# Patient Record
Sex: Male | Born: 1955
Health system: Southern US, Community
[De-identification: ages and names within clinical notes are randomized; demographics above are authoritative.]

## PROBLEM LIST (undated history)

## (undated) DIAGNOSIS — Z972 Presence of dental prosthetic device (complete) (partial): Secondary | ICD-10-CM

## (undated) DIAGNOSIS — I739 Peripheral vascular disease, unspecified: Secondary | ICD-10-CM

## (undated) DIAGNOSIS — N401 Enlarged prostate with lower urinary tract symptoms: Secondary | ICD-10-CM

## (undated) DIAGNOSIS — K621 Rectal polyp: Secondary | ICD-10-CM

## (undated) DIAGNOSIS — M5030 Other cervical disc degeneration, unspecified cervical region: Secondary | ICD-10-CM

## (undated) DIAGNOSIS — I452 Bifascicular block: Secondary | ICD-10-CM

## (undated) DIAGNOSIS — K635 Polyp of colon: Secondary | ICD-10-CM

## (undated) DIAGNOSIS — Z7982 Long term (current) use of aspirin: Secondary | ICD-10-CM

## (undated) DIAGNOSIS — R7301 Impaired fasting glucose: Secondary | ICD-10-CM

## (undated) DIAGNOSIS — Z87891 Personal history of nicotine dependence: Secondary | ICD-10-CM

## (undated) DIAGNOSIS — I5189 Other ill-defined heart diseases: Secondary | ICD-10-CM

## (undated) DIAGNOSIS — M5134 Other intervertebral disc degeneration, thoracic region: Secondary | ICD-10-CM

## (undated) DIAGNOSIS — R9431 Abnormal electrocardiogram [ECG] [EKG]: Secondary | ICD-10-CM

## (undated) DIAGNOSIS — K8020 Calculus of gallbladder without cholecystitis without obstruction: Secondary | ICD-10-CM

## (undated) DIAGNOSIS — Z8719 Personal history of other diseases of the digestive system: Secondary | ICD-10-CM

## (undated) DIAGNOSIS — R911 Solitary pulmonary nodule: Secondary | ICD-10-CM

## (undated) DIAGNOSIS — R972 Elevated prostate specific antigen [PSA]: Secondary | ICD-10-CM

## (undated) DIAGNOSIS — J439 Emphysema, unspecified: Secondary | ICD-10-CM

## (undated) DIAGNOSIS — I700 Atherosclerosis of aorta: Secondary | ICD-10-CM

## (undated) DIAGNOSIS — Z720 Tobacco use: Secondary | ICD-10-CM

## (undated) DIAGNOSIS — E785 Hyperlipidemia, unspecified: Secondary | ICD-10-CM

## (undated) DIAGNOSIS — I10 Essential (primary) hypertension: Secondary | ICD-10-CM

## (undated) DIAGNOSIS — K4020 Bilateral inguinal hernia, without obstruction or gangrene, not specified as recurrent: Secondary | ICD-10-CM

## (undated) DIAGNOSIS — K08109 Complete loss of teeth, unspecified cause, unspecified class: Secondary | ICD-10-CM

## (undated) DIAGNOSIS — K219 Gastro-esophageal reflux disease without esophagitis: Secondary | ICD-10-CM

## (undated) DIAGNOSIS — K5730 Diverticulosis of large intestine without perforation or abscess without bleeding: Secondary | ICD-10-CM

## (undated) DIAGNOSIS — E119 Type 2 diabetes mellitus without complications: Secondary | ICD-10-CM

## (undated) DIAGNOSIS — N200 Calculus of kidney: Secondary | ICD-10-CM

## (undated) DIAGNOSIS — I2510 Atherosclerotic heart disease of native coronary artery without angina pectoris: Secondary | ICD-10-CM

## (undated) HISTORY — DX: Emphysema, unspecified: J43.9

## (undated) HISTORY — DX: Personal history of other diseases of the digestive system: Z87.19

## (undated) HISTORY — DX: Atherosclerotic heart disease of native coronary artery without angina pectoris: I25.10

## (undated) HISTORY — PX: HERNIA REPAIR: SHX51

## (undated) HISTORY — DX: Essential (primary) hypertension: I10

## (undated) HISTORY — PX: CARDIAC CATHETERIZATION: SHX172

## (undated) HISTORY — PX: COLONOSCOPY: SHX174

## (undated) HISTORY — DX: Polyp of colon: K63.5

## (undated) HISTORY — DX: Impaired fasting glucose: R73.01

## (undated) HISTORY — DX: Hyperlipidemia, unspecified: E78.5

## (undated) HISTORY — DX: Diverticulosis of large intestine without perforation or abscess without bleeding: K57.30

## (undated) HISTORY — DX: Personal history of nicotine dependence: Z87.891

## (undated) HISTORY — DX: Gastro-esophageal reflux disease without esophagitis: K21.9

## (undated) HISTORY — PX: EYE SURGERY: SHX253

---

## 2001-06-29 ENCOUNTER — Observation Stay (HOSPITAL_COMMUNITY): Admission: RE | Admit: 2001-06-29 | Discharge: 2001-06-30 | Payer: Self-pay | Admitting: Neurosurgery

## 2001-06-29 HISTORY — PX: CERVICAL FUSION: SHX112

## 2006-01-21 HISTORY — PX: CORONARY ARTERY BYPASS GRAFT: SHX141

## 2006-11-13 ENCOUNTER — Ambulatory Visit: Payer: Self-pay | Admitting: Cardiovascular Disease

## 2006-11-13 DIAGNOSIS — I2510 Atherosclerotic heart disease of native coronary artery without angina pectoris: Secondary | ICD-10-CM

## 2006-11-13 HISTORY — PX: LEFT HEART CATH AND CORONARY ANGIOGRAPHY: CATH118249

## 2006-11-13 HISTORY — DX: Atherosclerotic heart disease of native coronary artery without angina pectoris: I25.10

## 2006-11-21 DIAGNOSIS — Z951 Presence of aortocoronary bypass graft: Secondary | ICD-10-CM

## 2006-11-21 HISTORY — DX: Presence of aortocoronary bypass graft: Z95.1

## 2006-11-21 HISTORY — PX: CORONARY ARTERY BYPASS GRAFT: SHX141

## 2009-01-21 DIAGNOSIS — Z8719 Personal history of other diseases of the digestive system: Secondary | ICD-10-CM

## 2009-01-21 DIAGNOSIS — K648 Other hemorrhoids: Secondary | ICD-10-CM

## 2009-01-21 DIAGNOSIS — K635 Polyp of colon: Secondary | ICD-10-CM

## 2009-01-21 DIAGNOSIS — K5730 Diverticulosis of large intestine without perforation or abscess without bleeding: Secondary | ICD-10-CM

## 2009-01-21 HISTORY — DX: Polyp of colon: K63.5

## 2009-01-21 HISTORY — DX: Other hemorrhoids: K64.8

## 2009-01-21 HISTORY — DX: Diverticulosis of large intestine without perforation or abscess without bleeding: K57.30

## 2009-01-21 HISTORY — DX: Personal history of other diseases of the digestive system: Z87.19

## 2009-06-22 ENCOUNTER — Ambulatory Visit: Payer: Self-pay | Admitting: Gastroenterology

## 2014-12-10 ENCOUNTER — Other Ambulatory Visit: Payer: Self-pay | Admitting: Family Medicine

## 2014-12-14 ENCOUNTER — Ambulatory Visit (INDEPENDENT_AMBULATORY_CARE_PROVIDER_SITE_OTHER): Payer: 59 | Admitting: Family Medicine

## 2014-12-14 ENCOUNTER — Encounter: Payer: Self-pay | Admitting: Family Medicine

## 2014-12-14 VITALS — BP 152/79 | HR 67 | Temp 98.8°F | Ht 72.2 in | Wt 199.0 lb

## 2014-12-14 DIAGNOSIS — Z0000 Encounter for general adult medical examination without abnormal findings: Secondary | ICD-10-CM

## 2014-12-14 DIAGNOSIS — I10 Essential (primary) hypertension: Secondary | ICD-10-CM

## 2014-12-14 DIAGNOSIS — Z23 Encounter for immunization: Secondary | ICD-10-CM | POA: Diagnosis not present

## 2014-12-14 DIAGNOSIS — I2510 Atherosclerotic heart disease of native coronary artery without angina pectoris: Secondary | ICD-10-CM | POA: Diagnosis not present

## 2014-12-14 DIAGNOSIS — Z113 Encounter for screening for infections with a predominantly sexual mode of transmission: Secondary | ICD-10-CM | POA: Diagnosis not present

## 2014-12-14 DIAGNOSIS — E785 Hyperlipidemia, unspecified: Secondary | ICD-10-CM | POA: Diagnosis not present

## 2014-12-14 DIAGNOSIS — E1169 Type 2 diabetes mellitus with other specified complication: Secondary | ICD-10-CM | POA: Insufficient documentation

## 2014-12-14 DIAGNOSIS — K219 Gastro-esophageal reflux disease without esophagitis: Secondary | ICD-10-CM | POA: Diagnosis not present

## 2014-12-14 DIAGNOSIS — Z1211 Encounter for screening for malignant neoplasm of colon: Secondary | ICD-10-CM

## 2014-12-14 LAB — MICROSCOPIC EXAMINATION: WBC UA: NONE SEEN /HPF (ref 0–?)

## 2014-12-14 LAB — URINALYSIS, ROUTINE W REFLEX MICROSCOPIC
BILIRUBIN UA: NEGATIVE
GLUCOSE, UA: NEGATIVE
Ketones, UA: NEGATIVE
LEUKOCYTES UA: NEGATIVE
Nitrite, UA: NEGATIVE
PROTEIN UA: NEGATIVE
Specific Gravity, UA: 1.015 (ref 1.005–1.030)
UUROB: 0.2 mg/dL (ref 0.2–1.0)
pH, UA: 6 (ref 5.0–7.5)

## 2014-12-14 MED ORDER — METOPROLOL SUCCINATE ER 100 MG PO TB24: 100.0000 mg | ORAL_TABLET | Freq: Every day | ORAL | Status: DC

## 2014-12-14 MED ORDER — ROSUVASTATIN CALCIUM 40 MG PO TABS: 40.0000 mg | ORAL_TABLET | Freq: Every day | ORAL | Status: DC

## 2014-12-14 MED ORDER — COLESEVELAM HCL 625 MG PO TABS: 1875.0000 mg | ORAL_TABLET | Freq: Two times a day (BID) | ORAL | Status: DC

## 2014-12-14 MED ORDER — AMLODIPINE BESYLATE 5 MG PO TABS: 5.0000 mg | ORAL_TABLET | Freq: Every day | ORAL | Status: DC

## 2014-12-14 MED ORDER — BENAZEPRIL HCL 40 MG PO TABS: 40.0000 mg | ORAL_TABLET | Freq: Every day | ORAL | Status: DC

## 2014-12-14 NOTE — Assessment & Plan Note (Signed)
The current medical regimen is effective;  continue present plan and medications.  

## 2014-12-14 NOTE — Progress Notes (Signed)
BP 152/79 mmHg  Pulse 67  Temp(Src) 98.8 F (37.1 C)  Ht 6' 0.2" (1.834 m)  Wt 199 lb (90.266 kg)  BMI 26.84 kg/m2  SpO2 99%   Subjective:    Patient ID: Maxwell Young, male    DOB: 06/23/1955, 59 y.o.   MRN: [id number]  HPI: Maxwell Young is a 59 y.o. male  Chief Complaint  Patient presents with  . Annual Exam   Patient doing well taking Benzapril metoprolol every day without problems or side effects blood pressure at home is been doing well Taking Crestor and WelChol for cholesterol which seems to be doing well no side effects taking aspirin and fish oil.  Relevant past medical, surgical, family and social history reviewed and updated as indicated. Interim medical history since our last visit reviewed. Allergies and medications reviewed and updated.  Review of Systems  Constitutional: Negative.   HENT: Negative.   Eyes: Negative.   Respiratory: Negative.   Cardiovascular: Negative.   Gastrointestinal: Negative.   Endocrine: Negative.   Genitourinary: Negative.   Musculoskeletal: Negative.   Skin: Negative.   Allergic/Immunologic: Negative.   Neurological: Negative.   Hematological: Negative.   Psychiatric/Behavioral: Negative.     Per HPI unless specifically indicated above     Objective:    BP 152/79 mmHg  Pulse 67  Temp(Src) 98.8 F (37.1 C)  Ht 6' 0.2" (1.834 m)  Wt 199 lb (90.266 kg)  BMI 26.84 kg/m2  SpO2 99%  Wt Readings from Last 3 Encounters:  12/14/14 199 lb (90.266 kg)  05/23/14 197 lb (89.359 kg)    Physical Exam  Constitutional: He is oriented to person, place, and time. He appears well-developed and well-nourished.  HENT:  Head: Normocephalic and atraumatic.  Right Ear: External ear normal.  Left Ear: External ear normal.  Eyes: Conjunctivae and EOM are normal. Pupils are equal, round, and reactive to light.  Neck: Normal range of motion. Neck supple.  Cardiovascular: Normal rate, regular rhythm, normal heart sounds and intact  distal pulses.   Pulmonary/Chest: Effort normal and breath sounds normal.  Abdominal: Soft. Bowel sounds are normal. There is no splenomegaly or hepatomegaly.  Genitourinary: Rectum normal, prostate normal and penis normal.  Musculoskeletal: Normal range of motion.  Neurological: He is alert and oriented to person, place, and time. He has normal reflexes.  Skin: No rash noted. No erythema.  Small sebaceous cyst under left eye evacuated  Psychiatric: He has a normal mood and affect. His behavior is normal. Judgment and thought content normal.    No results found for this or any previous visit.    Assessment & Plan:   Problem List Items Addressed This Visit      Cardiovascular and Mediastinum   Essential hypertension    Discussed blood pressure too high will start amlodipine continue other medications recheck 1 month      Relevant Medications   aspirin EC 325 MG tablet   benazepril (LOTENSIN) 40 MG tablet   metoprolol succinate (TOPROL-XL) 100 MG 24 hr tablet   rosuvastatin (CRESTOR) 40 MG tablet   colesevelam (WELCHOL) 625 MG tablet   amLODipine (NORVASC) 5 MG tablet   CAD (coronary artery disease)    NO SX      Relevant Medications   aspirin EC 325 MG tablet   benazepril (LOTENSIN) 40 MG tablet   metoprolol succinate (TOPROL-XL) 100 MG 24 hr tablet   rosuvastatin (CRESTOR) 40 MG tablet   colesevelam (WELCHOL) 625 MG tablet  amLODipine (NORVASC) 5 MG tablet     Digestive   GERD (gastroesophageal reflux disease)     Other   Hyperlipidemia    The current medical regimen is effective;  continue present plan and medications.       Relevant Medications   aspirin EC 325 MG tablet   benazepril (LOTENSIN) 40 MG tablet   metoprolol succinate (TOPROL-XL) 100 MG 24 hr tablet   rosuvastatin (CRESTOR) 40 MG tablet   colesevelam (WELCHOL) 625 MG tablet   amLODipine (NORVASC) 5 MG tablet    Other Visit Diagnoses    Immunization due    -  Primary    Relevant Orders    Flu  Vaccine QUAD 36+ mos PF IM (Fluarix & Fluzone Quad PF) (Completed)    Colon cancer screening        Relevant Orders    Ambulatory referral to General Surgery    Routine screening for STI (sexually transmitted infection)        Relevant Orders    HIV antibody    Hepatitis C Antibody    Routine general medical examination at a health care facility        Relevant Orders    CBC with Differential/Platelet    Comprehensive metabolic panel    Lipid Panel w/o Chol/HDL Ratio    PSA    TSH    Urinalysis, Routine w reflex microscopic (not at [facility])       Of course encouraged patient to quit smoking Follow up plan: Return in about 4 weeks (around 01/11/2015) for Blood pressure check.

## 2014-12-14 NOTE — Assessment & Plan Note (Signed)
Discussed blood pressure too high will start amlodipine continue other medications recheck 1 month

## 2014-12-14 NOTE — Assessment & Plan Note (Signed)
NO SX. °

## 2014-12-15 LAB — CBC WITH DIFFERENTIAL/PLATELET
BASOS ABS: 0 10*3/uL (ref 0.0–0.2)
Basos: 0 %
EOS (ABSOLUTE): 0.1 10*3/uL (ref 0.0–0.4)
Eos: 1 %
HEMOGLOBIN: 13.8 g/dL (ref 12.6–17.7)
Hematocrit: 41 % (ref 37.5–51.0)
IMMATURE GRANS (ABS): 0 10*3/uL (ref 0.0–0.1)
IMMATURE GRANULOCYTES: 1 %
Lymphocytes Absolute: 1.6 10*3/uL (ref 0.7–3.1)
Lymphs: 18 %
MCH: 31.7 pg (ref 26.6–33.0)
MCHC: 33.7 g/dL (ref 31.5–35.7)
MCV: 94 fL (ref 79–97)
MONOCYTES: 12 %
Monocytes Absolute: 1.1 10*3/uL — ABNORMAL HIGH (ref 0.1–0.9)
Neutrophils Absolute: 6 10*3/uL (ref 1.4–7.0)
Neutrophils: 68 %
Platelets: 150 10*3/uL (ref 150–379)
RBC: 4.36 x10E6/uL (ref 4.14–5.80)
RDW: 13.9 % (ref 12.3–15.4)
WBC: 8.8 10*3/uL (ref 3.4–10.8)

## 2014-12-15 LAB — COMPREHENSIVE METABOLIC PANEL
ALBUMIN: 4.1 g/dL (ref 3.5–5.5)
ALK PHOS: 76 IU/L (ref 39–117)
ALT: 27 IU/L (ref 0–44)
AST: 26 IU/L (ref 0–40)
Albumin/Globulin Ratio: 2 (ref 1.1–2.5)
BUN / CREAT RATIO: 14 (ref 9–20)
BUN: 14 mg/dL (ref 6–24)
Bilirubin Total: 0.4 mg/dL (ref 0.0–1.2)
CALCIUM: 8.7 mg/dL (ref 8.7–10.2)
CO2: 21 mmol/L (ref 18–29)
CREATININE: 1.01 mg/dL (ref 0.76–1.27)
Chloride: 103 mmol/L (ref 97–106)
GFR calc Af Amer: 95 mL/min/{1.73_m2} (ref >59)
GFR calc non Af Amer: 82 mL/min/{1.73_m2} (ref >59)
GLUCOSE: 137 mg/dL — AB (ref 65–99)
Globulin, Total: 2.1 g/dL (ref 1.5–4.5)
Potassium: 4.1 mmol/L (ref 3.5–5.2)
Sodium: 141 mmol/L (ref 136–144)
Total Protein: 6.2 g/dL (ref 6.0–8.5)

## 2014-12-15 LAB — LIPID PANEL W/O CHOL/HDL RATIO
CHOLESTEROL TOTAL: 112 mg/dL (ref 100–199)
HDL: 36 mg/dL — ABNORMAL LOW (ref >39)
LDL CALC: 46 mg/dL (ref 0–99)
TRIGLYCERIDES: 149 mg/dL (ref 0–149)
VLDL CHOLESTEROL CAL: 30 mg/dL (ref 5–40)

## 2014-12-15 LAB — PSA: Prostate Specific Ag, Serum: 2 ng/mL (ref 0.0–4.0)

## 2014-12-15 LAB — HIV ANTIBODY (ROUTINE TESTING W REFLEX): HIV SCREEN 4TH GENERATION: NONREACTIVE

## 2014-12-15 LAB — TSH: TSH: 1.35 u[IU]/mL (ref 0.450–4.500)

## 2014-12-15 LAB — HEPATITIS C ANTIBODY: Hep C Virus Ab: <0.1 s/co ratio (ref 0.0–0.9)

## 2014-12-19 ENCOUNTER — Encounter: Payer: Self-pay | Admitting: Family Medicine

## 2015-01-01 ENCOUNTER — Other Ambulatory Visit: Payer: Self-pay | Admitting: Family Medicine

## 2015-01-09 ENCOUNTER — Ambulatory Visit (INDEPENDENT_AMBULATORY_CARE_PROVIDER_SITE_OTHER): Payer: 59 | Admitting: Family Medicine

## 2015-01-09 ENCOUNTER — Encounter: Payer: Self-pay | Admitting: Family Medicine

## 2015-01-09 VITALS — BP 111/68 | HR 77 | Temp 97.8°F | Ht 72.7 in | Wt 200.2 lb

## 2015-01-09 DIAGNOSIS — I2510 Atherosclerotic heart disease of native coronary artery without angina pectoris: Secondary | ICD-10-CM | POA: Diagnosis not present

## 2015-01-09 DIAGNOSIS — E785 Hyperlipidemia, unspecified: Secondary | ICD-10-CM

## 2015-01-09 DIAGNOSIS — I10 Essential (primary) hypertension: Secondary | ICD-10-CM | POA: Diagnosis not present

## 2015-01-09 MED ORDER — AMLODIPINE BESYLATE 5 MG PO TABS: 5.0000 mg | ORAL_TABLET | Freq: Every day | ORAL | Status: DC

## 2015-01-09 NOTE — Assessment & Plan Note (Signed)
  Medication working well without side effects will continue

## 2015-01-09 NOTE — Assessment & Plan Note (Addendum)
The current medical regimen is effective;  continue present plan and medications. Discussed smoking cessation patient not interested encouraged anyway

## 2015-01-09 NOTE — Progress Notes (Signed)
BP 111/68 mmHg  Pulse 77  Temp(Src) 97.8 F (36.6 C)  Ht 6' 0.7" (1.847 m)  Wt 200 lb 3.2 oz (90.81 kg)  BMI 26.62 kg/m2  SpO2 98%   Subjective:    Patient ID: Maxwell Young, male    DOB: 06/15/1955, 59 y.o.   MRN: [id number]  HPI: Maxwell Young is a 59 y.o. male  Chief Complaint  Patient presents with  . Hypertension   patient doing well on new medication amlodipine takes without problems or side effects no complaints of edema Cholesterol medicine doing well no complaints no chest pain Discussed smoking cessation with patient patient still smoking heavily with no intention of quitting  Relevant past medical, surgical, family and social history reviewed and updated as indicated. Interim medical history since our last visit reviewed. Allergies and medications reviewed and updated.  Review of Systems  Constitutional: Negative.   Respiratory: Negative.   Cardiovascular: Negative.     Per HPI unless specifically indicated above     Objective:    BP 111/68 mmHg  Pulse 77  Temp(Src) 97.8 F (36.6 C)  Ht 6' 0.7" (1.847 m)  Wt 200 lb 3.2 oz (90.81 kg)  BMI 26.62 kg/m2  SpO2 98%  Wt Readings from Last 3 Encounters:  01/09/15 200 lb 3.2 oz (90.81 kg)  12/14/14 199 lb (90.266 kg)  05/23/14 197 lb (89.359 kg)    Physical Exam  Constitutional: He is oriented to person, place, and time. He appears well-developed and well-nourished. No distress.  HENT:  Head: Normocephalic and atraumatic.  Right Ear: Hearing normal.  Left Ear: Hearing normal.  Nose: Nose normal.  Eyes: Conjunctivae and lids are normal. Right eye exhibits no discharge. Left eye exhibits no discharge. No scleral icterus.  Cardiovascular: Normal rate and regular rhythm.   Pulmonary/Chest: Effort normal and breath sounds normal. No respiratory distress.  Musculoskeletal: Normal range of motion.  Neurological: He is alert and oriented to person, place, and time.  Skin: Skin is intact. No rash noted.   Psychiatric: He has a normal mood and affect. His speech is normal and behavior is normal. Judgment and thought content normal. Cognition and memory are normal.    Results for orders placed or performed in visit on 12/14/14  Microscopic Examination  Result Value Ref Range   WBC, UA None seen 0 -  5 /hpf   RBC, UA 0-2 0 -  2 /hpf   Epithelial Cells (non renal) 0-10 0 - 10 /hpf   Bacteria, UA Few None seen/Few  CBC with Differential/Platelet  Result Value Ref Range   WBC 8.8 3.4 - 10.8 x10E3/uL   RBC 4.36 4.14 - 5.80 x10E6/uL   Hemoglobin 13.8 12.6 - 17.7 g/dL   Hematocrit 41.0 37.5 - 51.0 %   MCV 94 79 - 97 fL   MCH 31.7 26.6 - 33.0 pg   MCHC 33.7 31.5 - 35.7 g/dL   RDW 13.9 12.3 - 15.4 %   Platelets 150 150 - 379 x10E3/uL   Neutrophils 68 %   Lymphs 18 %   Monocytes 12 %   Eos 1 %   Basos 0 %   Neutrophils Absolute 6.0 1.4 - 7.0 x10E3/uL   Lymphocytes Absolute 1.6 0.7 - 3.1 x10E3/uL   Monocytes Absolute 1.1 (H) 0.1 - 0.9 x10E3/uL   EOS (ABSOLUTE) 0.1 0.0 - 0.4 x10E3/uL   Basophils Absolute 0.0 0.0 - 0.2 x10E3/uL   Immature Granulocytes 1 %   Immature Grans (Abs) 0.0  0.0 - 0.1 x10E3/uL  Comprehensive metabolic panel  Result Value Ref Range   Glucose 137 (H) 65 - 99 mg/dL   BUN 14 6 - 24 mg/dL   Creatinine, Ser 1.01 0.76 - 1.27 mg/dL   GFR calc non Af Amer 82 >59 mL/min/1.73   GFR calc Af Amer 95 >59 mL/min/1.73   BUN/Creatinine Ratio 14 9 - 20   Sodium 141 136 - 144 mmol/L   Potassium 4.1 3.5 - 5.2 mmol/L   Chloride 103 97 - 106 mmol/L   CO2 21 18 - 29 mmol/L   Calcium 8.7 8.7 - 10.2 mg/dL   Total Protein 6.2 6.0 - 8.5 g/dL   Albumin 4.1 3.5 - 5.5 g/dL   Globulin, Total 2.1 1.5 - 4.5 g/dL   Albumin/Globulin Ratio 2.0 1.1 - 2.5   Bilirubin Total 0.4 0.0 - 1.2 mg/dL   Alkaline Phosphatase 76 39 - 117 IU/L   AST 26 0 - 40 IU/L   ALT 27 0 - 44 IU/L  Lipid Panel w/o Chol/HDL Ratio  Result Value Ref Range   Cholesterol, Total 112 100 - 199 mg/dL   Triglycerides 149  0 - 149 mg/dL   HDL 36 (L) >39 mg/dL   VLDL Cholesterol Cal 30 5 - 40 mg/dL   LDL Calculated 46 0 - 99 mg/dL  PSA  Result Value Ref Range   Prostate Specific Ag, Serum 2.0 0.0 - 4.0 ng/mL  TSH  Result Value Ref Range   TSH 1.350 0.450 - 4.500 uIU/mL  HIV antibody  Result Value Ref Range   HIV Screen 4th Generation wRfx Non Reactive Non Reactive  Urinalysis, Routine w reflex microscopic (not at [facility])  Result Value Ref Range   Specific Gravity, UA 1.015 1.005 - 1.030   pH, UA 6.0 5.0 - 7.5   Color, UA Yellow Yellow   Appearance Ur Clear Clear   Leukocytes, UA Negative Negative   Protein, UA Negative Negative/Trace   Glucose, UA Negative Negative   Ketones, UA Negative Negative   RBC, UA Trace (A) Negative   Bilirubin, UA Negative Negative   Urobilinogen, Ur 0.2 0.2 - 1.0 mg/dL   Nitrite, UA Negative Negative   Microscopic Examination See below:   Hepatitis C Antibody  Result Value Ref Range   Hep C Virus Ab <0.1 0.0 - 0.9 s/co ratio      Assessment & Plan:   Problem List Items Addressed This Visit      Cardiovascular and Mediastinum   Essential hypertension - Primary     Medication working well without side effects will continue      Relevant Medications   amLODipine (NORVASC) 5 MG tablet   CAD (coronary artery disease)    The current medical regimen is effective;  continue present plan and medications. Discussed smoking cessation patient not interested encouraged anyway      Relevant Medications   amLODipine (NORVASC) 5 MG tablet     Other   Hyperlipidemia    The current medical regimen is effective;  continue present plan and medications. Reviewed labs with patient and stable      Relevant Medications   amLODipine (NORVASC) 5 MG tablet       Follow up plan: Return for 5 mo med check lipids, alt, ast, BMP`.

## 2015-01-09 NOTE — Assessment & Plan Note (Signed)
The current medical regimen is effective;  continue present plan and medications. Reviewed labs with patient and stable

## 2015-03-13 ENCOUNTER — Telehealth: Payer: Self-pay

## 2015-03-13 DIAGNOSIS — E785 Hyperlipidemia, unspecified: Secondary | ICD-10-CM

## 2015-03-13 DIAGNOSIS — I10 Essential (primary) hypertension: Secondary | ICD-10-CM

## 2015-03-13 MED ORDER — AMLODIPINE BESYLATE 5 MG PO TABS: 5.0000 mg | ORAL_TABLET | Freq: Every day | ORAL | Status: DC

## 2015-03-13 MED ORDER — ROSUVASTATIN CALCIUM 40 MG PO TABS: 40.0000 mg | ORAL_TABLET | Freq: Every day | ORAL | Status: DC

## 2015-03-13 MED ORDER — METOPROLOL SUCCINATE ER 100 MG PO TB24: 100.0000 mg | ORAL_TABLET | Freq: Every day | ORAL | Status: DC

## 2015-03-13 MED ORDER — BENAZEPRIL HCL 40 MG PO TABS: 40.0000 mg | ORAL_TABLET | Freq: Every day | ORAL | Status: DC

## 2015-03-13 MED ORDER — COLESEVELAM HCL 625 MG PO TABS: 1875.0000 mg | ORAL_TABLET | Freq: Two times a day (BID) | ORAL | Status: DC

## 2015-03-13 NOTE — Telephone Encounter (Signed)
[facility]  Requesting 90 day Rx's  Amlodipine Benazepril Welchol Metoprolol Succ Rosuvastatin

## 2015-06-13 ENCOUNTER — Encounter: Payer: Self-pay | Admitting: Family Medicine

## 2015-06-13 ENCOUNTER — Ambulatory Visit (INDEPENDENT_AMBULATORY_CARE_PROVIDER_SITE_OTHER): Payer: 59 | Admitting: Family Medicine

## 2015-06-13 VITALS — BP 138/77 | HR 72 | Temp 98.0°F | Ht 72.2 in | Wt 199.0 lb

## 2015-06-13 DIAGNOSIS — I10 Essential (primary) hypertension: Secondary | ICD-10-CM

## 2015-06-13 DIAGNOSIS — Z1211 Encounter for screening for malignant neoplasm of colon: Secondary | ICD-10-CM

## 2015-06-13 DIAGNOSIS — E785 Hyperlipidemia, unspecified: Secondary | ICD-10-CM | POA: Diagnosis not present

## 2015-06-13 DIAGNOSIS — F17210 Nicotine dependence, cigarettes, uncomplicated: Secondary | ICD-10-CM | POA: Diagnosis not present

## 2015-06-13 LAB — LP+ALT+AST PICCOLO, WAIVED
ALT (SGPT) Piccolo, Waived: 43 U/L (ref 10–47)
AST (SGOT) Piccolo, Waived: 40 U/L — ABNORMAL HIGH (ref 11–38)
CHOL/HDL RATIO PICCOLO,WAIVE: 4.2 mg/dL
Cholesterol Piccolo, Waived: 147 mg/dL (ref <200)
HDL Chol Piccolo, Waived: 35 mg/dL — ABNORMAL LOW (ref >59)
LDL CHOL CALC PICCOLO WAIVED: 53 mg/dL (ref <100)
Triglycerides Piccolo,Waived: 296 mg/dL — ABNORMAL HIGH (ref <150)
VLDL CHOL CALC PICCOLO,WAIVE: 59 mg/dL — AB (ref <30)

## 2015-06-13 NOTE — Assessment & Plan Note (Signed)
The current medical regimen is effective;  continue present plan and medications.  

## 2015-06-13 NOTE — Progress Notes (Signed)
BP 138/77 mmHg  Pulse 72  Temp(Src) 98 F (36.7 C)  Ht 6' 0.2" (1.834 m)  Wt 199 lb (90.266 kg)  BMI 26.84 kg/m2  SpO2 99%   Subjective:    Patient ID: Maxwell Young, male    DOB: 1955/08/18, 60 y.o.   MRN: [id number]  HPI: Maxwell Young is a 60 y.o. male  Chief Complaint  Patient presents with  . Hypertension  . Hyperlipidemia  Patient all in all doing okay blood pressure no issues with medications taken faithfully without side effects. Cholesterol doing okay taking meds faithfully without side effects. Biggest concern today is loss of 2 brothers due to lung cancer who were smokers this year. Patient also continues to smoke wondered about screening tests. Discussed low dose CT pt smoked for 40 years at least 1 pack a day. Discussed stopping smoking and counseling. Relevant past medical, surgical, family and social history reviewed and updated as indicated. Interim medical history since our last visit reviewed. Allergies and medications reviewed and updated.  Review of Systems  Constitutional: Negative.   Respiratory: Negative.   Cardiovascular: Negative.     Per HPI unless specifically indicated above     Objective:    BP 138/77 mmHg  Pulse 72  Temp(Src) 98 F (36.7 C)  Ht 6' 0.2" (1.834 m)  Wt 199 lb (90.266 kg)  BMI 26.84 kg/m2  SpO2 99%  Wt Readings from Last 3 Encounters:  06/13/15 199 lb (90.266 kg)  01/09/15 200 lb 3.2 oz (90.81 kg)  12/14/14 199 lb (90.266 kg)    Physical Exam  Constitutional: He is oriented to person, place, and time. He appears well-developed and well-nourished. No distress.  HENT:  Head: Normocephalic and atraumatic.  Right Ear: Hearing normal.  Left Ear: Hearing normal.  Nose: Nose normal.  Eyes: Conjunctivae and lids are normal. Right eye exhibits no discharge. Left eye exhibits no discharge. No scleral icterus.  Cardiovascular: Normal rate, regular rhythm and normal heart sounds.   Pulmonary/Chest: Effort normal and  breath sounds normal. No respiratory distress.  Musculoskeletal: Normal range of motion.  Neurological: He is alert and oriented to person, place, and time.  Skin: Skin is intact. No rash noted.  Psychiatric: He has a normal mood and affect. His speech is normal and behavior is normal. Judgment and thought content normal. Cognition and memory are normal.    Results for orders placed or performed in visit on 12/14/14  Microscopic Examination  Result Value Ref Range   WBC, UA None seen 0 -  5 /hpf   RBC, UA 0-2 0 -  2 /hpf   Epithelial Cells (non renal) 0-10 0 - 10 /hpf   Bacteria, UA Few None seen/Few  CBC with Differential/Platelet  Result Value Ref Range   WBC 8.8 3.4 - 10.8 x10E3/uL   RBC 4.36 4.14 - 5.80 x10E6/uL   Hemoglobin 13.8 12.6 - 17.7 g/dL   Hematocrit 41.0 37.5 - 51.0 %   MCV 94 79 - 97 fL   MCH 31.7 26.6 - 33.0 pg   MCHC 33.7 31.5 - 35.7 g/dL   RDW 13.9 12.3 - 15.4 %   Platelets 150 150 - 379 x10E3/uL   Neutrophils 68 %   Lymphs 18 %   Monocytes 12 %   Eos 1 %   Basos 0 %   Neutrophils Absolute 6.0 1.4 - 7.0 x10E3/uL   Lymphocytes Absolute 1.6 0.7 - 3.1 x10E3/uL   Monocytes Absolute 1.1 (H) 0.1 -  0.9 x10E3/uL   EOS (ABSOLUTE) 0.1 0.0 - 0.4 x10E3/uL   Basophils Absolute 0.0 0.0 - 0.2 x10E3/uL   Immature Granulocytes 1 %   Immature Grans (Abs) 0.0 0.0 - 0.1 x10E3/uL  Comprehensive metabolic panel  Result Value Ref Range   Glucose 137 (H) 65 - 99 mg/dL   BUN 14 6 - 24 mg/dL   Creatinine, Ser 1.01 0.76 - 1.27 mg/dL   GFR calc non Af Amer 82 >59 mL/min/1.73   GFR calc Af Amer 95 >59 mL/min/1.73   BUN/Creatinine Ratio 14 9 - 20   Sodium 141 136 - 144 mmol/L   Potassium 4.1 3.5 - 5.2 mmol/L   Chloride 103 97 - 106 mmol/L   CO2 21 18 - 29 mmol/L   Calcium 8.7 8.7 - 10.2 mg/dL   Total Protein 6.2 6.0 - 8.5 g/dL   Albumin 4.1 3.5 - 5.5 g/dL   Globulin, Total 2.1 1.5 - 4.5 g/dL   Albumin/Globulin Ratio 2.0 1.1 - 2.5   Bilirubin Total 0.4 0.0 - 1.2 mg/dL    Alkaline Phosphatase 76 39 - 117 IU/L   AST 26 0 - 40 IU/L   ALT 27 0 - 44 IU/L  Lipid Panel w/o Chol/HDL Ratio  Result Value Ref Range   Cholesterol, Total 112 100 - 199 mg/dL   Triglycerides 149 0 - 149 mg/dL   HDL 36 (L) >39 mg/dL   VLDL Cholesterol Cal 30 5 - 40 mg/dL   LDL Calculated 46 0 - 99 mg/dL  PSA  Result Value Ref Range   Prostate Specific Ag, Serum 2.0 0.0 - 4.0 ng/mL  TSH  Result Value Ref Range   TSH 1.350 0.450 - 4.500 uIU/mL  HIV antibody  Result Value Ref Range   HIV Screen 4th Generation wRfx Non Reactive Non Reactive  Urinalysis, Routine w reflex microscopic (not at [facility])  Result Value Ref Range   Specific Gravity, UA 1.015 1.005 - 1.030   pH, UA 6.0 5.0 - 7.5   Color, UA Yellow Yellow   Appearance Ur Clear Clear   Leukocytes, UA Negative Negative   Protein, UA Negative Negative/Trace   Glucose, UA Negative Negative   Ketones, UA Negative Negative   RBC, UA Trace (A) Negative   Bilirubin, UA Negative Negative   Urobilinogen, Ur 0.2 0.2 - 1.0 mg/dL   Nitrite, UA Negative Negative   Microscopic Examination See below:   Hepatitis C Antibody  Result Value Ref Range   Hep C Virus Ab <0.1 0.0 - 0.9 s/co ratio      Assessment & Plan:   Problem List Items Addressed This Visit      Cardiovascular and Mediastinum   Essential hypertension - Primary    The current medical regimen is effective;  continue present plan and medications.       Relevant Orders   LP+ALT+AST Piccolo, Waived   Basic metabolic panel     Other   Hyperlipidemia    The current medical regimen is effective;  continue present plan and medications.       Relevant Orders   LP+ALT+AST Piccolo, Waived   Basic metabolic panel   Smoking greater than 40 pack years   Relevant Orders   CT CHEST LUNG CA SCREEN LOW DOSE W/O CM    Other Visit Diagnoses    Colon cancer screening        Relevant Orders    Ambulatory referral to General Surgery        Follow  up plan: Return in  about 6 months (around 12/14/2015) for Physical Exam.

## 2015-06-14 ENCOUNTER — Encounter: Payer: Self-pay | Admitting: Family Medicine

## 2015-06-14 LAB — BASIC METABOLIC PANEL
BUN/Creatinine Ratio: 12 (ref 9–20)
BUN: 12 mg/dL (ref 6–24)
CO2: 21 mmol/L (ref 18–29)
CREATININE: 1 mg/dL (ref 0.76–1.27)
Calcium: 9.3 mg/dL (ref 8.7–10.2)
Chloride: 102 mmol/L (ref 96–106)
GFR calc Af Amer: 95 mL/min/{1.73_m2} (ref >59)
GFR calc non Af Amer: 83 mL/min/{1.73_m2} (ref >59)
Glucose: 127 mg/dL — ABNORMAL HIGH (ref 65–99)
Potassium: 4.3 mmol/L (ref 3.5–5.2)
Sodium: 141 mmol/L (ref 134–144)

## 2015-06-15 ENCOUNTER — Telehealth: Payer: Self-pay | Admitting: *Deleted

## 2015-06-15 NOTE — Telephone Encounter (Signed)
Received referral for low dose lung cancer screening CT scan. Voicemail left at phone number listed in EMR for patient to call me back to facilitate scheduling scan.  

## 2015-07-04 ENCOUNTER — Telehealth: Payer: Self-pay | Admitting: Gastroenterology

## 2015-07-04 NOTE — Telephone Encounter (Signed)
colonoscopy

## 2015-07-07 ENCOUNTER — Other Ambulatory Visit: Payer: Self-pay

## 2015-07-07 ENCOUNTER — Telehealth: Payer: Self-pay

## 2015-07-07 NOTE — Telephone Encounter (Signed)
Pt scheduled for screening colonoscopy at [facility] on 07/24/15. Please precert.

## 2015-07-07 NOTE — Telephone Encounter (Signed)
Gastroenterology Pre-Procedure Review  Request Date: 07/24/15 Requesting Physician: Dr.   PATIENT REVIEW QUESTIONS: The patient responded to the following health history questions as indicated:    1. Are you having any GI issues? no 2. Do you have a personal history of Polyps? no 3. Do you have a family history of Colon Cancer or Polyps? no 4. Diabetes Mellitus? no 5. Joint replacements in the past 12 months?no 6. Major health problems in the past 3 months?no 7. Any artificial heart valves, MVP, or defibrillator?yes (Bypass surgery 2008)    MEDICATIONS & ALLERGIES:    Patient reports the following regarding taking any anticoagulation/antiplatelet therapy:   Plavix, Coumadin, Eliquis, Xarelto, Lovenox, Pradaxa, Brilinta, or Effient? no Aspirin? yes (ASA 325mg )  Patient confirms/reports the following medications:  Current Outpatient Prescriptions  Medication Sig Dispense Refill  . amLODipine (NORVASC) 5 MG tablet Take 1 tablet (5 mg total) by mouth daily. 90 tablet 4  . aspirin EC 325 MG tablet Take 325 mg by mouth daily.    . benazepril (LOTENSIN) 40 MG tablet Take 1 tablet (40 mg total) by mouth daily. 90 tablet 4  . colesevelam (WELCHOL) 625 MG tablet Take 3 tablets (1,875 mg total) by mouth 2 (two) times daily. 540 tablet 4  . KRILL OIL PO Take by mouth.    . metoprolol succinate (TOPROL-XL) 100 MG 24 hr tablet Take 1 tablet (100 mg total) by mouth daily. Take with or immediately following a meal. 90 tablet 4  . Multiple Vitamins-Minerals (MULTIVITAMIN ADULT PO) Take by mouth daily.    . rosuvastatin (CRESTOR) 40 MG tablet Take 1 tablet (40 mg total) by mouth daily. 90 tablet 4   No current facility-administered medications for this visit.    Patient confirms/reports the following allergies:  No Known Allergies  No orders of the defined types were placed in this encounter.    AUTHORIZATION INFORMATION Primary Insurance: 1D#: Group #:  Secondary Insurance: 1D#: Group  #:  SCHEDULE INFORMATION: Date: 07/24/15 Time: Location: [place]

## 2015-07-13 ENCOUNTER — Telehealth: Payer: Self-pay | Admitting: *Deleted

## 2015-07-13 NOTE — Telephone Encounter (Signed)
Received referral for low dose lung cancer screening CT scan. Voicemail left at phone number listed in EMR for patient to call me back to facilitate scheduling scan.  

## 2015-07-13 NOTE — Telephone Encounter (Signed)
Received referral for initial lung cancer screening scan. Unable to keep prior appt. Contacted patient and obtained smoking history,(current 40 pack year history) as well as answering questions related to screening process. Patient denies signs of lung cancer such as weight loss or hemoptysis. Patient denies comorbidity that would prevent curative treatment if lung cancer were found. Patient is tentatively scheduled for shared decision making visit and CT scan on 07/28/15 at 2pm, pending insurance approval from business office.

## 2015-07-19 ENCOUNTER — Encounter: Payer: Self-pay | Admitting: *Deleted

## 2015-07-20 NOTE — Discharge Instructions (Signed)

## 2015-07-24 ENCOUNTER — Ambulatory Visit: Payer: 59 | Admitting: Anesthesiology

## 2015-07-24 ENCOUNTER — Ambulatory Visit
Admission: RE | Admit: 2015-07-24 | Discharge: 2015-07-24 | Disposition: A | Discharge status: Home / Self Care | Payer: 59 | Source: Ambulatory Visit | Attending: Gastroenterology | Admitting: Gastroenterology

## 2015-07-24 ENCOUNTER — Encounter
Admission: RE | Disposition: A | Discharge status: Home / Self Care | Payer: Self-pay | Source: Ambulatory Visit | Attending: Gastroenterology

## 2015-07-24 DIAGNOSIS — E785 Hyperlipidemia, unspecified: Secondary | ICD-10-CM | POA: Insufficient documentation

## 2015-07-24 DIAGNOSIS — I2510 Atherosclerotic heart disease of native coronary artery without angina pectoris: Secondary | ICD-10-CM | POA: Insufficient documentation

## 2015-07-24 DIAGNOSIS — I10 Essential (primary) hypertension: Secondary | ICD-10-CM | POA: Insufficient documentation

## 2015-07-24 DIAGNOSIS — D123 Benign neoplasm of transverse colon: Secondary | ICD-10-CM | POA: Diagnosis not present

## 2015-07-24 DIAGNOSIS — Z823 Family history of stroke: Secondary | ICD-10-CM | POA: Diagnosis not present

## 2015-07-24 DIAGNOSIS — F17210 Nicotine dependence, cigarettes, uncomplicated: Secondary | ICD-10-CM | POA: Diagnosis not present

## 2015-07-24 DIAGNOSIS — Z860100 Personal history of colon polyps, unspecified: Secondary | ICD-10-CM | POA: Insufficient documentation

## 2015-07-24 DIAGNOSIS — K5730 Diverticulosis of large intestine without perforation or abscess without bleeding: Secondary | ICD-10-CM | POA: Diagnosis not present

## 2015-07-24 DIAGNOSIS — Z981 Arthrodesis status: Secondary | ICD-10-CM | POA: Insufficient documentation

## 2015-07-24 DIAGNOSIS — K621 Rectal polyp: Secondary | ICD-10-CM | POA: Insufficient documentation

## 2015-07-24 DIAGNOSIS — Z820 Family history of epilepsy and other diseases of the nervous system: Secondary | ICD-10-CM | POA: Insufficient documentation

## 2015-07-24 DIAGNOSIS — Z8249 Family history of ischemic heart disease and other diseases of the circulatory system: Secondary | ICD-10-CM | POA: Diagnosis not present

## 2015-07-24 DIAGNOSIS — Z7982 Long term (current) use of aspirin: Secondary | ICD-10-CM | POA: Diagnosis not present

## 2015-07-24 DIAGNOSIS — K642 Third degree hemorrhoids: Secondary | ICD-10-CM | POA: Diagnosis not present

## 2015-07-24 DIAGNOSIS — R7301 Impaired fasting glucose: Secondary | ICD-10-CM | POA: Diagnosis not present

## 2015-07-24 DIAGNOSIS — Z951 Presence of aortocoronary bypass graft: Secondary | ICD-10-CM | POA: Diagnosis not present

## 2015-07-24 DIAGNOSIS — Z800 Family history of malignant neoplasm of digestive organs: Secondary | ICD-10-CM | POA: Insufficient documentation

## 2015-07-24 DIAGNOSIS — Z1211 Encounter for screening for malignant neoplasm of colon: Secondary | ICD-10-CM | POA: Insufficient documentation

## 2015-07-24 DIAGNOSIS — Z79899 Other long term (current) drug therapy: Secondary | ICD-10-CM | POA: Diagnosis not present

## 2015-07-24 DIAGNOSIS — K219 Gastro-esophageal reflux disease without esophagitis: Secondary | ICD-10-CM | POA: Diagnosis not present

## 2015-07-24 HISTORY — PX: POLYPECTOMY: SHX5525

## 2015-07-24 HISTORY — PX: COLONOSCOPY WITH PROPOFOL: SHX5780

## 2015-07-24 HISTORY — DX: Presence of dental prosthetic device (complete) (partial): Z97.2

## 2015-07-24 SURGERY — COLONOSCOPY WITH PROPOFOL
Anesthesia: Monitor Anesthesia Care | Wound class: Contaminated

## 2015-07-24 MED ORDER — LACTATED RINGERS IV SOLN
INTRAVENOUS | Status: DC
  Administered 2015-07-24: 10:00:00 via INTRAVENOUS

## 2015-07-24 MED ORDER — PROPOFOL 10 MG/ML IV BOLUS
INTRAVENOUS | Status: DC | PRN
  Administered 2015-07-24: 70 mg via INTRAVENOUS
  Administered 2015-07-24: 30 mg via INTRAVENOUS
  Administered 2015-07-24 (×6): 20 mg via INTRAVENOUS

## 2015-07-24 MED ORDER — STERILE WATER FOR IRRIGATION IR SOLN
Status: DC | PRN
  Administered 2015-07-24: 11:00:00

## 2015-07-24 MED ORDER — LIDOCAINE HCL (CARDIAC) 20 MG/ML IV SOLN
INTRAVENOUS | Status: DC | PRN
  Administered 2015-07-24: 50 mg via INTRAVENOUS

## 2015-07-24 SURGICAL SUPPLY — 23 items
CANISTER SUCT 1200ML W/VALVE (MISCELLANEOUS) ×4 IMPLANT
CLIP HMST 235XBRD CATH ROT (MISCELLANEOUS) IMPLANT
CLIP RESOLUTION 360 11X235 (MISCELLANEOUS)
FCP ESCP3.2XJMB 240X2.8X (MISCELLANEOUS)
FORCEPS BIOP RAD 4 LRG CAP 4 (CUTTING FORCEPS) ×4 IMPLANT
FORCEPS BIOP RJ4 240 W/NDL (MISCELLANEOUS)
FORCEPS ESCP3.2XJMB 240X2.8X (MISCELLANEOUS) IMPLANT
GOWN CVR UNV OPN BCK APRN NK (MISCELLANEOUS) ×4 IMPLANT
GOWN ISOL THUMB LOOP REG UNIV (MISCELLANEOUS) ×8
INJECTOR VARIJECT VIN23 (MISCELLANEOUS) IMPLANT
KIT DEFENDO VALVE AND CONN (KITS) IMPLANT
KIT ENDO PROCEDURE OLY (KITS) ×4 IMPLANT
MARKER SPOT ENDO TATTOO 5ML (MISCELLANEOUS) IMPLANT
PAD GROUND ADULT SPLIT (MISCELLANEOUS) IMPLANT
PROBE APC STR FIRE (PROBE) IMPLANT
RETRIEVER NET ROTH 2.5X230 LF (MISCELLANEOUS) ×4 IMPLANT
SNARE SHORT THROW 13M SML OVAL (MISCELLANEOUS) IMPLANT
SNARE SHORT THROW 30M LRG OVAL (MISCELLANEOUS) IMPLANT
SNARE SNG USE RND 15MM (INSTRUMENTS) IMPLANT
SPOT EX ENDOSCOPIC TATTOO (MISCELLANEOUS)
TRAP ETRAP POLY (MISCELLANEOUS) ×4 IMPLANT
VARIJECT INJECTOR VIN23 (MISCELLANEOUS)
WATER STERILE IRR 250ML POUR (IV SOLUTION) ×4 IMPLANT

## 2015-07-24 NOTE — Anesthesia Postprocedure Evaluation (Signed)
Anesthesia Post Note  Patient: Maxwell Young  Procedure(s) Performed: Procedure(s) (LRB): COLONOSCOPY WITH PROPOFOL (N/A) POLYPECTOMY  Patient location during evaluation: PACU Anesthesia Type: MAC Level of consciousness: awake and alert and oriented Pain management: satisfactory to patient Vital Signs Assessment: post-procedure vital signs reviewed and stable Respiratory status: spontaneous breathing, nonlabored ventilation and respiratory function stable Cardiovascular status: blood pressure returned to baseline and stable Postop Assessment: Adequate PO intake and No signs of nausea or vomiting Anesthetic complications: no    Raliegh Ip

## 2015-07-24 NOTE — H&P (Signed)
Lucilla Lame, MD [facility] [address] [place], [place] [address] Phone: [phone] Fax : [phone]  Primary Care Physician:  Golden Pop, MD Primary Gastroenterologist:  Dr. Allen Norris  Pre-Procedure History & Physical: HPI:  Maxwell Young is a 60 y.o. male is here for an colonoscopy.   Past Medical History  Diagnosis Date  . GERD (gastroesophageal reflux disease)   . CAD (coronary artery disease)   . Hyperlipidemia   . Elevated fasting glucose   . Diverticula of colon 2011  . Colon polyps 2011  . History of hemorrhoids 2011    internal  . Hypertension   . Wears dentures     full upper and lower    Past Surgical History  Procedure Laterality Date  . Cervical fusion  06/09/003  . Coronary artery bypass graft  2008    [facility] - 3 vessel  . Colonoscopy      Prior to Admission medications   Medication Sig Start Date End Date Taking? Authorizing Provider  amLODipine (NORVASC) 5 MG tablet Take 1 tablet (5 mg total) by mouth daily. 03/13/15  Yes Guadalupe Maple, MD  aspirin EC 325 MG tablet Take 325 mg by mouth daily.   Yes Historical Provider, MD  benazepril (LOTENSIN) 40 MG tablet Take 1 tablet (40 mg total) by mouth daily. 03/13/15  Yes Guadalupe Maple, MD  colesevelam (WELCHOL) 625 MG tablet Take 3 tablets (1,875 mg total) by mouth 2 (two) times daily. 03/13/15  Yes Guadalupe Maple, MD  KRILL OIL PO Take by mouth.   Yes Historical Provider, MD  metoprolol succinate (TOPROL-XL) 100 MG 24 hr tablet Take 1 tablet (100 mg total) by mouth daily. Take with or immediately following a meal. 03/13/15  Yes Guadalupe Maple, MD  Multiple Vitamins-Minerals (MULTIVITAMIN ADULT PO) Take by mouth daily.   Yes Historical Provider, MD  ranitidine (ZANTAC) 150 MG tablet Take 150 mg by mouth daily.   Yes Historical Provider, MD  rosuvastatin (CRESTOR) 40 MG tablet Take 1 tablet (40 mg total) by mouth daily. 03/13/15  Yes Guadalupe Maple, MD    Allergies as of 07/07/2015  . (No Known  Allergies)    Family History  Problem Relation Age of Onset  . Hypertension Mother   . Heart disease Mother     CAD  . Cancer Father     colon  . Stroke Father   . Parkinson's disease Father     Social History   Social History  . Marital Status: Married    Spouse Name: N/A  . Number of Children: N/A  . Years of Education: N/A   Occupational History  . Not on file.   Social History Main Topics  . Smoking status: Current Every Day Smoker -- 1.00 packs/day for 40 years    Types: Cigarettes  . Smokeless tobacco: Never Used  . Alcohol Use: No  . Drug Use: No  . Sexual Activity: Not on file   Other Topics Concern  . Not on file   Social History Narrative    Review of Systems: See HPI, otherwise negative ROS  Physical Exam: BP 145/90 mmHg  Pulse 99  Temp(Src) 99.5 F (37.5 C) (Temporal)  Resp 16  Ht 6' (1.829 m)  Wt 194 lb (87.998 kg)  BMI 26.31 kg/m2  SpO2 96% General:   Alert,  pleasant and cooperative in NAD Head:  Normocephalic and atraumatic. Neck:  Supple; no masses or thyromegaly. Lungs:  Clear throughout to auscultation.  Heart:  Regular rate and rhythm. Abdomen:  Soft, nontender and nondistended. Normal bowel sounds, without guarding, and without rebound.   Neurologic:  Alert and  oriented x4;  grossly normal neurologically.  Impression/Plan: CHATHAM BREDEHOFT is here for an colonoscopy to be performed for history of colon polyps  Risks, benefits, limitations, and alternatives regarding  colonoscopy have been reviewed with the patient.  Questions have been answered.  All parties agreeable.   Lucilla Lame, MD  07/24/2015, 10:14 AM

## 2015-07-24 NOTE — Anesthesia Preprocedure Evaluation (Signed)
Anesthesia Evaluation  Patient identified by MRN, date of birth, ID band  Reviewed: Allergy & Precautions, H&P , NPO status , Patient's Chart, lab work & pertinent test results  Airway Mallampati: II  TM Distance: >3 FB Neck ROM: full    Dental  (+) Edentulous Lower, Edentulous Upper   Pulmonary Current Smoker,    Pulmonary exam normal        Cardiovascular hypertension, + CAD   Rhythm:regular Rate:Normal + Systolic murmurs    Neuro/Psych    GI/Hepatic GERD  ,  Endo/Other    Renal/GU      Musculoskeletal   Abdominal   Peds  Hematology   Anesthesia Other Findings   Reproductive/Obstetrics                             Anesthesia Physical Anesthesia Plan  ASA: II  Anesthesia Plan: MAC   Post-op Pain Management:    Induction:   Airway Management Planned:   Additional Equipment:   Intra-op Plan:   Post-operative Plan:   Informed Consent: I have reviewed the patients History and Physical, chart, labs and discussed the procedure including the risks, benefits and alternatives for the proposed anesthesia with the patient or authorized representative who has indicated his/her understanding and acceptance.     Plan Discussed with: CRNA  Anesthesia Plan Comments:         Anesthesia Quick Evaluation

## 2015-07-24 NOTE — Anesthesia Procedure Notes (Signed)
Procedure Name: MAC Performed by: Matheson Vandehei Pre-anesthesia Checklist: Patient identified, Emergency Drugs available, Suction available, Timeout performed and Patient being monitored Patient Re-evaluated:Patient Re-evaluated prior to inductionOxygen Delivery Method: Nasal cannula Placement Confirmation: positive ETCO2       

## 2015-07-24 NOTE — Op Note (Signed)
[facility] Gastroenterology Patient Name: Maxwell Young Procedure Date: 07/24/2015 10:41 AM MRN: [id number] Account #: [id number] Date of Birth: [birth year] Admit Type: Outpatient Age: 60 Room: [facility] OR ROOM 01 Gender: Male Note Status: Finalized Procedure:            Colonoscopy Indications:          High risk colon cancer surveillance: Personal history                        of colonic polyps Providers:            Lucilla Lame, MD Referring MD:         Guadalupe Maple, MD (Referring MD) Medicines:            Propofol per Anesthesia Complications:        No immediate complications. Procedure:            Pre-Anesthesia Assessment:                       - Prior to the procedure, a History and Physical was                        performed, and patient medications and allergies were                        reviewed. The patient's tolerance of previous                        anesthesia was also reviewed. The risks and benefits of                        the procedure and the sedation options and risks were                        discussed with the patient. All questions were                        answered, and informed consent was obtained. Prior                        Anticoagulants: The patient has taken no previous                        anticoagulant or antiplatelet agents. ASA Grade                        Assessment: II - A patient with mild systemic disease.                        After reviewing the risks and benefits, the patient was                        deemed in satisfactory condition to undergo the                        procedure.                       After obtaining informed consent, the colonoscope was  passed under direct vision. Throughout the procedure,                        the patient's blood pressure, pulse, and oxygen                        saturations were monitored continuously. The Olympus CF                         H180AL colonoscope (S#: [id number]) was introduced through                        the anus and advanced to the the cecum, identified by                        appendiceal orifice and ileocecal valve. The                        colonoscopy was performed without difficulty. The                        patient tolerated the procedure well. The quality of                        the bowel preparation was good. Findings:      The perianal and digital rectal examinations were normal.      A 3 mm polyp was found in the transverse colon. The polyp was sessile.       The polyp was removed with a cold biopsy forceps. Resection and       retrieval were complete.      A 6 mm polyp was found in the rectum. The polyp was sessile. The polyp       was removed with a cold snare. Resection and retrieval were complete.      Non-bleeding internal hemorrhoids were found during retroflexion. The       hemorrhoids were Grade III (internal hemorrhoids that prolapse but       require manual reduction).      A few small-mouthed diverticula were found in the sigmoid colon. Impression:           - One 3 mm polyp in the transverse colon, removed with                        a cold biopsy forceps. Resected and retrieved.                       - One 6 mm polyp in the rectum, removed with a cold                        snare. Resected and retrieved.                       - Non-bleeding internal hemorrhoids.                       - Diverticulosis in the sigmoid colon. Recommendation:       - Await pathology results.                       - Repeat colonoscopy in  5 years for surveillance. Procedure Code(s):    --- Professional ---                       [phone], Colonoscopy, flexible; with removal of tumor(s),                        polyp(s), or other lesion(s) by snare technique                       45380, 80, Colonoscopy, flexible; with biopsy, single                        or multiple Diagnosis Code(s):    --- Professional  ---                       Z86.010, Personal history of colonic polyps                       D12.3, Benign neoplasm of transverse colon (hepatic                        flexure or splenic flexure)                       K62.1, Rectal polyp CPT copyright 2016 [employer]. All rights reserved. The codes documented in this report are preliminary and upon coder review may  be revised to meet current compliance requirements. Lucilla Lame, MD 07/24/2015 11:00:57 AM This report has been signed electronically. Number of Addenda: 0 Note Initiated On: 07/24/2015 10:41 AM Scope Withdrawal Time: 0 hours 7 minutes 39 seconds  Total Procedure Duration: 0 hours 9 minutes 0 seconds       [facility]

## 2015-07-24 NOTE — Transfer of Care (Signed)
Immediate Anesthesia Transfer of Care Note  Patient: Maxwell Young  Procedure(s) Performed: Procedure(s): COLONOSCOPY WITH PROPOFOL (N/A) POLYPECTOMY  Patient Location: PACU  Anesthesia Type: MAC  Level of Consciousness: awake, alert  and patient cooperative  Airway and Oxygen Therapy: Patient Spontanous Breathing and Patient connected to supplemental oxygen  Post-op Assessment: Post-op Vital signs reviewed, Patient's Cardiovascular Status Stable, Respiratory Function Stable, Patent Airway and No signs of Nausea or vomiting  Post-op Vital Signs: Reviewed and stable  Complications: No apparent anesthesia complications

## 2015-07-26 ENCOUNTER — Encounter: Payer: Self-pay | Admitting: Gastroenterology

## 2015-07-27 ENCOUNTER — Encounter: Payer: Self-pay | Admitting: Family Medicine

## 2015-07-27 ENCOUNTER — Encounter: Payer: Self-pay | Admitting: Gastroenterology

## 2015-07-27 ENCOUNTER — Other Ambulatory Visit: Payer: Self-pay | Admitting: Family Medicine

## 2015-07-27 DIAGNOSIS — Z87891 Personal history of nicotine dependence: Secondary | ICD-10-CM | POA: Insufficient documentation

## 2015-07-27 HISTORY — DX: Personal history of nicotine dependence: Z87.891

## 2015-07-28 ENCOUNTER — Ambulatory Visit
Admission: RE | Admit: 2015-07-28 | Discharge: 2015-07-28 | Disposition: A | Discharge status: Home / Self Care | Payer: 59 | Source: Ambulatory Visit | Attending: Family Medicine | Admitting: Family Medicine

## 2015-07-28 ENCOUNTER — Encounter: Payer: Self-pay | Admitting: Family Medicine

## 2015-07-28 ENCOUNTER — Inpatient Hospital Stay: Payer: 59 | Attending: Family Medicine | Admitting: Family Medicine

## 2015-07-28 DIAGNOSIS — I700 Atherosclerosis of aorta: Secondary | ICD-10-CM | POA: Insufficient documentation

## 2015-07-28 DIAGNOSIS — J439 Emphysema, unspecified: Secondary | ICD-10-CM | POA: Diagnosis not present

## 2015-07-28 DIAGNOSIS — R911 Solitary pulmonary nodule: Secondary | ICD-10-CM | POA: Diagnosis not present

## 2015-07-28 DIAGNOSIS — Z87891 Personal history of nicotine dependence: Secondary | ICD-10-CM

## 2015-07-28 DIAGNOSIS — Z122 Encounter for screening for malignant neoplasm of respiratory organs: Secondary | ICD-10-CM

## 2015-07-28 NOTE — Progress Notes (Signed)
In accordance with CMS guidelines, patient has meet eligibility criteria including age, absence of signs or symptoms of lung cancer, the specific calculation of cigarette smoking pack-years was 40 years and is a current smoker.   A shared decision-making session was conducted prior to the performance of CT scan. This includes one or more decision aids, includes benefits and harms of screening, follow-up diagnostic testing, over-diagnosis, false positive rate, and total radiation exposure.  Counseling on the importance of adherence to annual lung cancer LDCT screening, impact of co-morbidities, and ability or willingness to undergo diagnosis and treatment is imperative for compliance of the program.  Counseling on the importance of continued smoking cessation for former smokers; the importance of smoking cessation for current smokers and information about tobacco cessation interventions have been given to patient including the [facility], 1800 quit [place], as well as Cancer Center specific smoking cessation programs.  Written order for lung cancer screening with LDCT has been given to the patient and any and all questions have been answered to the best of my abilities.   Yearly follow up will be scheduled by Shawn Perkins, Thoracic Navigator.   

## 2015-07-31 ENCOUNTER — Telehealth: Payer: Self-pay | Admitting: *Deleted

## 2015-07-31 NOTE — Telephone Encounter (Signed)
Notified patient of LDCT lung cancer screening results with recommendation for 3 month follow up imaging vs more urgent workup. Also notified of incidental finding noted below. Informed patient that these results have been shared with Dr. Rance Muir office and with their approval we will be discussing at our thoracic conference this week. Patient verbalizes understanding.    IMPRESSION: 1. Lung-RADS Category 4A, suspicious. Follow up low-dose chest CT without contrast in 3 months (please use the following order, "CT CHEST LCS NODULE FOLLOW-UP W/O CM") is recommended. Alternatively, PET may be considered when there is a solid component 71mm or larger. 2. Emphysema 3. Aortic atherosclerosis.

## 2015-10-24 ENCOUNTER — Telehealth: Payer: Self-pay | Admitting: *Deleted

## 2015-10-24 NOTE — Telephone Encounter (Signed)
Left voicemail for patient notifyng them that it is time to schedule lung cancer screening follow up CT scan. Instructed patient to call back to verify information prior to the scan being scheduled.  

## 2015-10-26 ENCOUNTER — Telehealth: Payer: Self-pay | Admitting: *Deleted

## 2015-10-26 NOTE — Telephone Encounter (Signed)
Patient returned call, Notified patient that lung cancer screening follow up CT scan is due. Confirmed that patient is within the age range of 55-77, and asymptomatic, (no signs or symptoms of lung cancer). Patient denies illness that would prevent curative treatment for lung cancer if found. The patient is a current smoker, with a 40 pack year history. The shared decision making visit was done 07/28/15. Patient is agreeable for CT scan being scheduled.

## 2015-11-02 ENCOUNTER — Other Ambulatory Visit: Payer: Self-pay | Admitting: *Deleted

## 2015-11-02 DIAGNOSIS — R9389 Abnormal findings on diagnostic imaging of other specified body structures: Secondary | ICD-10-CM

## 2015-11-14 ENCOUNTER — Ambulatory Visit: Payer: 59

## 2015-11-20 ENCOUNTER — Ambulatory Visit
Admission: RE | Admit: 2015-11-20 | Discharge: 2015-11-20 | Disposition: A | Discharge status: Home / Self Care | Payer: 59 | Source: Ambulatory Visit | Attending: Oncology | Admitting: Oncology

## 2015-11-20 DIAGNOSIS — R938 Abnormal findings on diagnostic imaging of other specified body structures: Secondary | ICD-10-CM | POA: Insufficient documentation

## 2015-11-20 DIAGNOSIS — J439 Emphysema, unspecified: Secondary | ICD-10-CM | POA: Insufficient documentation

## 2015-11-20 DIAGNOSIS — J984 Other disorders of lung: Secondary | ICD-10-CM | POA: Diagnosis not present

## 2015-11-20 DIAGNOSIS — K8020 Calculus of gallbladder without cholecystitis without obstruction: Secondary | ICD-10-CM | POA: Insufficient documentation

## 2015-11-20 DIAGNOSIS — I700 Atherosclerosis of aorta: Secondary | ICD-10-CM | POA: Diagnosis not present

## 2015-11-20 DIAGNOSIS — R9389 Abnormal findings on diagnostic imaging of other specified body structures: Secondary | ICD-10-CM

## 2015-11-22 ENCOUNTER — Telehealth: Payer: Self-pay | Admitting: *Deleted

## 2015-11-22 NOTE — Telephone Encounter (Signed)
Notified patient of LDCT lung cancer screening results with recommendation for 12 month follow up imaging. Also notified of incidental finding noted below. Patient verbalizes understanding. This note will be forwarded to PCP via Epic.   IMPRESSION: 1. Lung-RADS Category 2, benign appearance or behavior. Continue annual screening with low-dose chest CT without contrast in 12 months. 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 2.  Aortic atherosclerosis.  Prior median sternotomy. 3. Cholelithiasis

## 2015-12-21 ENCOUNTER — Encounter: Payer: 59 | Admitting: Family Medicine

## 2016-01-01 ENCOUNTER — Encounter: Payer: Self-pay | Admitting: Family Medicine

## 2016-01-01 ENCOUNTER — Ambulatory Visit (INDEPENDENT_AMBULATORY_CARE_PROVIDER_SITE_OTHER): Payer: 59 | Admitting: Family Medicine

## 2016-01-01 VITALS — BP 132/74 | HR 64 | Temp 97.5°F | Ht 72.75 in | Wt 201.0 lb

## 2016-01-01 DIAGNOSIS — I10 Essential (primary) hypertension: Secondary | ICD-10-CM | POA: Diagnosis not present

## 2016-01-01 DIAGNOSIS — F17210 Nicotine dependence, cigarettes, uncomplicated: Secondary | ICD-10-CM

## 2016-01-01 DIAGNOSIS — K219 Gastro-esophageal reflux disease without esophagitis: Secondary | ICD-10-CM | POA: Diagnosis not present

## 2016-01-01 DIAGNOSIS — Z0001 Encounter for general adult medical examination with abnormal findings: Secondary | ICD-10-CM | POA: Diagnosis not present

## 2016-01-01 DIAGNOSIS — Z23 Encounter for immunization: Secondary | ICD-10-CM | POA: Diagnosis not present

## 2016-01-01 DIAGNOSIS — E7800 Pure hypercholesterolemia, unspecified: Secondary | ICD-10-CM

## 2016-01-01 DIAGNOSIS — I2510 Atherosclerotic heart disease of native coronary artery without angina pectoris: Secondary | ICD-10-CM

## 2016-01-01 DIAGNOSIS — L989 Disorder of the skin and subcutaneous tissue, unspecified: Secondary | ICD-10-CM | POA: Diagnosis not present

## 2016-01-01 DIAGNOSIS — Z0000 Encounter for general adult medical examination without abnormal findings: Secondary | ICD-10-CM

## 2016-01-01 LAB — URINALYSIS, ROUTINE W REFLEX MICROSCOPIC
BILIRUBIN UA: NEGATIVE
GLUCOSE, UA: NEGATIVE
Ketones, UA: NEGATIVE
Leukocytes, UA: NEGATIVE
NITRITE UA: NEGATIVE
PH UA: 6.5 (ref 5.0–7.5)
PROTEIN UA: NEGATIVE
Specific Gravity, UA: 1.01 (ref 1.005–1.030)
UUROB: 0.2 mg/dL (ref 0.2–1.0)

## 2016-01-01 LAB — MICROSCOPIC EXAMINATION
Bacteria, UA: NONE SEEN
Epithelial Cells (non renal): NONE SEEN /hpf (ref 0–10)
RBC MICROSCOPIC, UA: NONE SEEN /HPF (ref 0–?)
WBC UA: NONE SEEN /HPF (ref 0–?)

## 2016-01-01 MED ORDER — AMLODIPINE BESYLATE 5 MG PO TABS: 5.0000 mg | ORAL_TABLET | Freq: Every day | ORAL | 4 refills | Status: DC

## 2016-01-01 MED ORDER — METOPROLOL SUCCINATE ER 100 MG PO TB24: 100.0000 mg | ORAL_TABLET | Freq: Every day | ORAL | 4 refills | Status: DC

## 2016-01-01 MED ORDER — ROSUVASTATIN CALCIUM 40 MG PO TABS: 40.0000 mg | ORAL_TABLET | Freq: Every day | ORAL | 4 refills | Status: DC

## 2016-01-01 MED ORDER — COLESEVELAM HCL 625 MG PO TABS: 1875.0000 mg | ORAL_TABLET | Freq: Two times a day (BID) | ORAL | 4 refills | Status: DC

## 2016-01-01 MED ORDER — BENAZEPRIL HCL 40 MG PO TABS: 40.0000 mg | ORAL_TABLET | Freq: Every day | ORAL | 4 refills | Status: DC

## 2016-01-01 NOTE — Assessment & Plan Note (Signed)
The current medical regimen is effective;  continue present plan and medications.  

## 2016-01-01 NOTE — Assessment & Plan Note (Signed)
Discussed again the evils of smoking patient getting CT follow-ups of lungs. And of course encouraged to quit smoking again.

## 2016-01-01 NOTE — Assessment & Plan Note (Signed)
We will schedule shave biopsy skin lesion

## 2016-01-01 NOTE — Progress Notes (Signed)
BP 132/74 (BP Location: Left Arm)   Pulse 64   Temp 97.5 F (36.4 C)   Ht 6' 0.75" (1.848 m)   Wt 201 lb (91.2 kg)   SpO2 99%   BMI 26.70 kg/m    Subjective:    Patient ID: Maxwell Young, male    DOB: [birth year], 60 y.o.   MRN: [id number]  HPI: Maxwell Young is a 60 y.o. male  Chief Complaint  Patient presents with  . Annual Exam  Patient all in all doing well no complaints medical issues stable no chest pain chest tightness issues breathing is doing okay Still smoking and in chest CT follow-up. Otherwise taking blood pressure cholesterol medicines without problems or issues.   Relevant past medical, surgical, family and social history reviewed and updated as indicated. Interim medical history since our last visit reviewed. Allergies and medications reviewed and updated.  Review of Systems  Constitutional: Negative.   HENT: Negative.   Eyes: Negative.   Respiratory: Negative.   Cardiovascular: Negative.   Gastrointestinal: Negative.   Endocrine: Negative.   Genitourinary: Negative.   Musculoskeletal: Negative.   Skin: Negative.   Allergic/Immunologic: Negative.   Neurological: Negative.   Hematological: Negative.   Psychiatric/Behavioral: Negative.     Per HPI unless specifically indicated above     Objective:    BP 132/74 (BP Location: Left Arm)   Pulse 64   Temp 97.5 F (36.4 C)   Ht 6' 0.75" (1.848 m)   Wt 201 lb (91.2 kg)   SpO2 99%   BMI 26.70 kg/m   Wt Readings from Last 3 Encounters:  01/01/16 201 lb (91.2 kg)  07/28/15 197 lb (89.4 kg)  07/24/15 194 lb (88 kg)    Physical Exam  Constitutional: He is oriented to person, place, and time. He appears well-developed and well-nourished.  HENT:  Head: Normocephalic and atraumatic.  Right Ear: External ear normal.  Left Ear: External ear normal.  Eyes: Conjunctivae and EOM are normal. Pupils are equal, round, and reactive to light.  Neck: Normal range of motion. Neck supple.    Cardiovascular: Normal rate, regular rhythm, normal heart sounds and intact distal pulses.   Pulmonary/Chest: Effort normal and breath sounds normal.  Abdominal: Soft. Bowel sounds are normal. There is no splenomegaly or hepatomegaly.  Genitourinary: Rectum normal, prostate normal and penis normal.  Musculoskeletal: Normal range of motion.  Neurological: He is alert and oriented to person, place, and time. He has normal reflexes.  Skin: No rash noted. No erythema.  Nonhealing lesion right shoulder area appearance of basal cell. Will occ itch scratch and bleed  Psychiatric: He has a normal mood and affect. His behavior is normal. Judgment and thought content normal.    Results for orders placed or performed in visit on 06/13/15  LP+ALT+AST Piccolo, [employer]  Result Value Ref Range   ALT (SGPT) Piccolo, Waived 43 10 - 47 U/L   AST (SGOT) Piccolo, Waived 40 (H) 11 - 38 U/L   Cholesterol Piccolo, Waived 147 <200 mg/dL   HDL Chol Piccolo, Waived 35 (L) >59 mg/dL   Triglycerides Piccolo,Waived 296 (H) <150 mg/dL   Chol/HDL Ratio Piccolo,Waive 4.2 mg/dL   LDL Chol Calc Piccolo Waived 53 <100 mg/dL   VLDL Chol Calc Piccolo,Waive 59 (H) <30 mg/dL  Basic metabolic panel  Result Value Ref Range   Glucose 127 (H) 65 - 99 mg/dL   BUN 12 6 - 24 mg/dL   Creatinine, Ser 1.00 0.76 - 1.27  mg/dL   GFR calc non Af Amer 83 >59 mL/min/1.73   GFR calc Af Amer 95 >59 mL/min/1.73   BUN/Creatinine Ratio 12 9 - 20   Sodium 141 134 - 144 mmol/L   Potassium 4.3 3.5 - 5.2 mmol/L   Chloride 102 96 - 106 mmol/L   CO2 21 18 - 29 mmol/L   Calcium 9.3 8.7 - 10.2 mg/dL      Assessment & Plan:   Problem List Items Addressed This Visit      Cardiovascular and Mediastinum   Essential hypertension    The current medical regimen is effective;  continue present plan and medications.       Relevant Medications   rosuvastatin (CRESTOR) 40 MG tablet   metoprolol succinate (TOPROL-XL) 100 MG 24 hr tablet    colesevelam (WELCHOL) 625 MG tablet   benazepril (LOTENSIN) 40 MG tablet   amLODipine (NORVASC) 5 MG tablet   CAD (coronary artery disease)    The current medical regimen is effective;  continue present plan and medications.       Relevant Medications   rosuvastatin (CRESTOR) 40 MG tablet   metoprolol succinate (TOPROL-XL) 100 MG 24 hr tablet   colesevelam (WELCHOL) 625 MG tablet   benazepril (LOTENSIN) 40 MG tablet   amLODipine (NORVASC) 5 MG tablet     Digestive   GERD (gastroesophageal reflux disease)    Taking over-the-counter Nexium about every other day does well with no heartburn issues.      Relevant Medications   esomeprazole (NEXIUM) 20 MG capsule     Musculoskeletal and Integument   Skin lesion of right arm    We will schedule shave biopsy skin lesion        Other   Hyperlipidemia    The current medical regimen is effective;  continue present plan and medications.       Relevant Medications   rosuvastatin (CRESTOR) 40 MG tablet   metoprolol succinate (TOPROL-XL) 100 MG 24 hr tablet   colesevelam (WELCHOL) 625 MG tablet   benazepril (LOTENSIN) 40 MG tablet   amLODipine (NORVASC) 5 MG tablet   Smoking greater than 40 pack years    Discussed again the evils of smoking patient getting CT follow-ups of lungs. And of course encouraged to quit smoking again.       Other Visit Diagnoses    Need for influenza vaccination    -  Primary   Relevant Orders   Flu Vaccine QUAD 36+ mos PF IM (Fluarix & Fluzone Quad PF) (Completed)   PE (physical exam), annual       Relevant Orders   Comprehensive metabolic panel   Lipid panel   CBC with Differential/Platelet   TSH   Urinalysis, Routine w reflex microscopic   PSA      Patient will come back later this year or early next year for shave biopsy shoulder lesion Follow up plan: Return for BMP,  Lipids, ALT, AST.

## 2016-01-01 NOTE — Assessment & Plan Note (Signed)
Taking over-the-counter Nexium about every other day does well with no heartburn issues.

## 2016-01-02 LAB — COMPREHENSIVE METABOLIC PANEL
ALBUMIN: 4.6 g/dL (ref 3.5–5.5)
ALK PHOS: 89 IU/L (ref 39–117)
ALT: 37 IU/L (ref 0–44)
AST: 30 IU/L (ref 0–40)
Albumin/Globulin Ratio: 2 (ref 1.2–2.2)
BUN / CREAT RATIO: 13 (ref 9–20)
BUN: 12 mg/dL (ref 6–24)
Bilirubin Total: 0.4 mg/dL (ref 0.0–1.2)
CALCIUM: 9.2 mg/dL (ref 8.7–10.2)
CO2: 24 mmol/L (ref 18–29)
CREATININE: 0.95 mg/dL (ref 0.76–1.27)
Chloride: 101 mmol/L (ref 96–106)
GFR, EST AFRICAN AMERICAN: 102 mL/min/{1.73_m2} (ref >59)
GFR, EST NON AFRICAN AMERICAN: 88 mL/min/{1.73_m2} (ref >59)
GLOBULIN, TOTAL: 2.3 g/dL (ref 1.5–4.5)
GLUCOSE: 108 mg/dL — AB (ref 65–99)
Potassium: 4 mmol/L (ref 3.5–5.2)
SODIUM: 141 mmol/L (ref 134–144)
TOTAL PROTEIN: 6.9 g/dL (ref 6.0–8.5)

## 2016-01-02 LAB — CBC WITH DIFFERENTIAL/PLATELET
BASOS ABS: 0 10*3/uL (ref 0.0–0.2)
BASOS: 0 %
EOS (ABSOLUTE): 0.1 10*3/uL (ref 0.0–0.4)
Eos: 1 %
Hematocrit: 44.9 % (ref 37.5–51.0)
Hemoglobin: 14.7 g/dL (ref 13.0–17.7)
IMMATURE GRANULOCYTES: 1 %
Immature Grans (Abs): 0.1 10*3/uL (ref 0.0–0.1)
Lymphocytes Absolute: 2.6 10*3/uL (ref 0.7–3.1)
Lymphs: 25 %
MCH: 31.7 pg (ref 26.6–33.0)
MCHC: 32.7 g/dL (ref 31.5–35.7)
MCV: 97 fL (ref 79–97)
MONOS ABS: 1 10*3/uL — AB (ref 0.1–0.9)
Monocytes: 9 %
NEUTROS ABS: 6.8 10*3/uL (ref 1.4–7.0)
NEUTROS PCT: 64 %
PLATELETS: 175 10*3/uL (ref 150–379)
RBC: 4.63 x10E6/uL (ref 4.14–5.80)
RDW: 14.1 % (ref 12.3–15.4)
WBC: 10.7 10*3/uL (ref 3.4–10.8)

## 2016-01-02 LAB — LIPID PANEL
CHOL/HDL RATIO: 5.1 ratio — AB (ref 0.0–5.0)
CHOLESTEROL TOTAL: 157 mg/dL (ref 100–199)
HDL: 31 mg/dL — ABNORMAL LOW (ref >39)
Triglycerides: 443 mg/dL — ABNORMAL HIGH (ref 0–149)

## 2016-01-02 LAB — PSA: PROSTATE SPECIFIC AG, SERUM: 1.9 ng/mL (ref 0.0–4.0)

## 2016-01-02 LAB — TSH: TSH: 1.6 u[IU]/mL (ref 0.450–4.500)

## 2016-01-03 ENCOUNTER — Telehealth: Payer: Self-pay | Admitting: Family Medicine

## 2016-01-03 NOTE — Telephone Encounter (Signed)
Phone call Discussed with patient triglycerides elevated cholesterol elevated patient still taking medications faithfully plus fish oil for big capsules a day. Patient has gained weight is only difference. Reviewed patient will do better with weight diet nutrition weight loss and have's scheduled in 6 months repeat lipid panel again

## 2016-01-08 ENCOUNTER — Encounter: Payer: Self-pay | Admitting: Family Medicine

## 2016-01-08 ENCOUNTER — Ambulatory Visit (INDEPENDENT_AMBULATORY_CARE_PROVIDER_SITE_OTHER): Payer: 59 | Admitting: Family Medicine

## 2016-01-08 DIAGNOSIS — L989 Disorder of the skin and subcutaneous tissue, unspecified: Secondary | ICD-10-CM | POA: Diagnosis not present

## 2016-01-08 NOTE — Assessment & Plan Note (Signed)
Shave biopsy performed patient education given on wound care follow-up pending pathology report

## 2016-01-08 NOTE — Progress Notes (Signed)
BP 130/79 (BP Location: Left Arm, Patient Position: Sitting, Cuff Size: Large)   Pulse 85   Temp 98.1 F (36.7 C)   Wt 199 lb 11.2 oz (90.6 kg)   SpO2 95%   BMI 26.53 kg/m    Subjective:    Patient ID: Maxwell Young, male    DOB: 08/23/1955, 60 y.o.   MRN: [id number]  HPI: Maxwell Young is a 60 y.o. male  Chief Complaint  Patient presents with  . skin lesion  Patient with nonhealing skin lesion right arm scratching bleed gets caught on his clothing. Patient has been taking extra care of it is less inflamed and less scaly.  Relevant past medical, surgical, family and social history reviewed and updated as indicated. Interim medical history since our last visit reviewed. Allergies and medications reviewed and updated.  Review of Systems  Constitutional: Negative.   Respiratory: Negative.   Cardiovascular: Negative.     Per HPI unless specifically indicated above     Objective:    BP 130/79 (BP Location: Left Arm, Patient Position: Sitting, Cuff Size: Large)   Pulse 85   Temp 98.1 F (36.7 C)   Wt 199 lb 11.2 oz (90.6 kg)   SpO2 95%   BMI 26.53 kg/m   Wt Readings from Last 3 Encounters:  01/08/16 199 lb 11.2 oz (90.6 kg)  01/01/16 201 lb (91.2 kg)  07/28/15 197 lb (89.4 kg)    Physical Exam  Skin:  Skin lesion after informed consent measured at 6 mm shave biopsy performed after Betadine and alcohol prep Xylocaine with epinephrine anesthesia. Lesion sent for pathology    Results for orders placed or performed in visit on 01/01/16  Microscopic Examination  Result Value Ref Range   WBC, UA None seen 0 - 5 /hpf   RBC, UA None seen 0 - 2 /hpf   Epithelial Cells (non renal) None seen 0 - 10 /hpf   Bacteria, UA None seen None seen/Few  Comprehensive metabolic panel  Result Value Ref Range   Glucose 108 (H) 65 - 99 mg/dL   BUN 12 6 - 24 mg/dL   Creatinine, Ser 0.95 0.76 - 1.27 mg/dL   GFR calc non Af Amer 88 >59 mL/min/1.73   GFR calc Af Amer 102 >59  mL/min/1.73   BUN/Creatinine Ratio 13 9 - 20   Sodium 141 134 - 144 mmol/L   Potassium 4.0 3.5 - 5.2 mmol/L   Chloride 101 96 - 106 mmol/L   CO2 24 18 - 29 mmol/L   Calcium 9.2 8.7 - 10.2 mg/dL   Total Protein 6.9 6.0 - 8.5 g/dL   Albumin 4.6 3.5 - 5.5 g/dL   Globulin, Total 2.3 1.5 - 4.5 g/dL   Albumin/Globulin Ratio 2.0 1.2 - 2.2   Bilirubin Total 0.4 0.0 - 1.2 mg/dL   Alkaline Phosphatase 89 39 - 117 IU/L   AST 30 0 - 40 IU/L   ALT 37 0 - 44 IU/L  Lipid panel  Result Value Ref Range   Cholesterol, Total 157 100 - 199 mg/dL   Triglycerides 443 (H) 0 - 149 mg/dL   HDL 31 (L) >39 mg/dL   VLDL Cholesterol Cal Comment 5 - 40 mg/dL   LDL Calculated Comment 0 - 99 mg/dL   Chol/HDL Ratio 5.1 (H) 0.0 - 5.0 ratio units  CBC with Differential/Platelet  Result Value Ref Range   WBC 10.7 3.4 - 10.8 x10E3/uL   RBC 4.63 4.14 - 5.80 x10E6/uL  Hemoglobin 14.7 13.0 - 17.7 g/dL   Hematocrit 44.9 37.5 - 51.0 %   MCV 97 79 - 97 fL   MCH 31.7 26.6 - 33.0 pg   MCHC 32.7 31.5 - 35.7 g/dL   RDW 14.1 12.3 - 15.4 %   Platelets 175 150 - 379 x10E3/uL   Neutrophils 64 Not Estab. %   Lymphs 25 Not Estab. %   Monocytes 9 Not Estab. %   Eos 1 Not Estab. %   Basos 0 Not Estab. %   Neutrophils Absolute 6.8 1.4 - 7.0 x10E3/uL   Lymphocytes Absolute 2.6 0.7 - 3.1 x10E3/uL   Monocytes Absolute 1.0 (H) 0.1 - 0.9 x10E3/uL   EOS (ABSOLUTE) 0.1 0.0 - 0.4 x10E3/uL   Basophils Absolute 0.0 0.0 - 0.2 x10E3/uL   Immature Granulocytes 1 Not Estab. %   Immature Grans (Abs) 0.1 0.0 - 0.1 x10E3/uL  TSH  Result Value Ref Range   TSH 1.600 0.450 - 4.500 uIU/mL  Urinalysis, Routine w reflex microscopic  Result Value Ref Range   Specific Gravity, UA 1.010 1.005 - 1.030   pH, UA 6.5 5.0 - 7.5   Color, UA Yellow Yellow   Appearance Ur Clear Clear   Leukocytes, UA Negative Negative   Protein, UA Negative Negative/Trace   Glucose, UA Negative Negative   Ketones, UA Negative Negative   RBC, UA Trace (A)  Negative   Bilirubin, UA Negative Negative   Urobilinogen, Ur 0.2 0.2 - 1.0 mg/dL   Nitrite, UA Negative Negative   Microscopic Examination See below:   PSA  Result Value Ref Range   Prostate Specific Ag, Serum 1.9 0.0 - 4.0 ng/mL      Assessment & Plan:   Problem List Items Addressed This Visit      Musculoskeletal and Integument   Skin lesion of right arm    Shave biopsy performed patient education given on wound care follow-up pending pathology report      Relevant Orders   Pathology       Follow up plan: Return for As scheduled.

## 2016-01-11 LAB — PATHOLOGY

## 2016-01-17 ENCOUNTER — Telehealth: Payer: Self-pay | Admitting: Family Medicine

## 2016-01-17 NOTE — Telephone Encounter (Signed)
Please let him know that his mole was just an irritated mole. Thanks!

## 2016-01-17 NOTE — Telephone Encounter (Signed)
Called and left patient a VM asking for him to please return my call.  

## 2016-01-18 NOTE — Telephone Encounter (Signed)
Called and left patient a VM asking for him to please return my call.  

## 2016-01-23 ENCOUNTER — Encounter: Payer: Self-pay | Admitting: Family Medicine

## 2016-01-23 NOTE — Telephone Encounter (Signed)
Called and left patient a VM asking for him to please return my call. I have tried calling patient 3 times with no returned call. Can we send a letter?

## 2016-01-23 NOTE — Telephone Encounter (Signed)
Letter generated

## 2016-07-01 ENCOUNTER — Encounter: Payer: Self-pay | Admitting: Family Medicine

## 2016-07-01 ENCOUNTER — Ambulatory Visit (INDEPENDENT_AMBULATORY_CARE_PROVIDER_SITE_OTHER): Payer: 59 | Admitting: Family Medicine

## 2016-07-01 VITALS — BP 132/80 | HR 75 | Ht 72.0 in | Wt 198.0 lb

## 2016-07-01 DIAGNOSIS — I10 Essential (primary) hypertension: Secondary | ICD-10-CM

## 2016-07-01 DIAGNOSIS — I2510 Atherosclerotic heart disease of native coronary artery without angina pectoris: Secondary | ICD-10-CM

## 2016-07-01 DIAGNOSIS — E7800 Pure hypercholesterolemia, unspecified: Secondary | ICD-10-CM

## 2016-07-01 LAB — LP+ALT+AST PICCOLO, WAIVED
ALT (SGPT) PICCOLO, WAIVED: 36 U/L (ref 10–47)
AST (SGOT) Piccolo, Waived: 37 U/L (ref 11–38)
CHOLESTEROL PICCOLO, WAIVED: 164 mg/dL (ref <200)
Chol/HDL Ratio Piccolo,Waive: 3.9 mg/dL
HDL Chol Piccolo, Waived: 43 mg/dL — ABNORMAL LOW (ref >59)
LDL CHOL CALC PICCOLO WAIVED: 59 mg/dL (ref <100)
Triglycerides Piccolo,Waived: 313 mg/dL — ABNORMAL HIGH (ref <150)
VLDL CHOL CALC PICCOLO,WAIVE: 63 mg/dL — AB (ref <30)

## 2016-07-01 NOTE — Assessment & Plan Note (Signed)
The current medical regimen is effective;  continue present plan and medications.  

## 2016-07-01 NOTE — Progress Notes (Signed)
   BP 132/80 (BP Location: Left Arm)   Pulse 75   Ht 6' (1.829 m)   Wt 198 lb (89.8 kg)   SpO2 98%   BMI 26.85 kg/m    Subjective:    Patient ID: Maxwell Young, male    DOB: [birth year], 61 y.o.   MRN: [id number]  HPI: Maxwell Young is a 61 y.o. male  Recheck hypertension hypercholesterol Patient taking medications faithfully without problems side effects or issues. Stomach also doing well without signs or symptoms. Apparent good control  Relevant past medical, surgical, family and social history reviewed and updated as indicated. Interim medical history since our last visit reviewed. Allergies and medications reviewed and updated.  Review of Systems  Constitutional: Negative.   Respiratory: Negative.   Cardiovascular: Negative.     Per HPI unless specifically indicated above     Objective:    BP 132/80 (BP Location: Left Arm)   Pulse 75   Ht 6' (1.829 m)   Wt 198 lb (89.8 kg)   SpO2 98%   BMI 26.85 kg/m   Wt Readings from Last 3 Encounters:  07/01/16 198 lb (89.8 kg)  01/08/16 199 lb 11.2 oz (90.6 kg)  01/01/16 201 lb (91.2 kg)    Physical Exam  Constitutional: He is oriented to person, place, and time. He appears well-developed and well-nourished.  HENT:  Head: Normocephalic and atraumatic.  Eyes: Conjunctivae and EOM are normal.  Neck: Normal range of motion.  Cardiovascular: Normal rate, regular rhythm and normal heart sounds.   Pulmonary/Chest: Effort normal and breath sounds normal.  Musculoskeletal: Normal range of motion.  Neurological: He is alert and oriented to person, place, and time.  Skin: No erythema.  Psychiatric: He has a normal mood and affect. His behavior is normal. Judgment and thought content normal.    Results for orders placed or performed in visit on 01/08/16  Pathology  Result Value Ref Range   PATH REPORT.SITE OF ORIGIN SPEC Comment    . Comment    PATH REPORT.RELEVANT HX SPEC Comment    PATH REPORT.FINAL DX SPEC Comment     SIGNED OUT BY: Comment    GROSS DESCRIPTION: Comment    . Comment    PAYMENT PROCEDURE Comment       Assessment & Plan:   Problem List Items Addressed This Visit      Cardiovascular and Mediastinum   Essential hypertension - Primary    The current medical regimen is effective;  continue present plan and medications.       Relevant Orders   Basic metabolic panel   LP+ALT+AST Piccolo, Waived   CAD (coronary artery disease)    The current medical regimen is effective;  continue present plan and medications.         Other   Hyperlipidemia    The current medical regimen is effective;  continue present plan and medications.       Relevant Orders   Basic metabolic panel   LP+ALT+AST Piccolo, Waived       Follow up plan: Return in about 6 months (around 12/31/2016) for Physical Exam.

## 2016-07-02 LAB — BASIC METABOLIC PANEL
BUN/Creatinine Ratio: 12 (ref 9–20)
BUN: 12 mg/dL (ref 6–24)
CO2: 24 mmol/L (ref 20–29)
Calcium: 9.4 mg/dL (ref 8.7–10.2)
Chloride: 106 mmol/L (ref 96–106)
Creatinine, Ser: 0.97 mg/dL (ref 0.76–1.27)
GFR calc Af Amer: 98 mL/min/{1.73_m2} (ref >59)
GFR, EST NON AFRICAN AMERICAN: 85 mL/min/{1.73_m2} (ref >59)
GLUCOSE: 147 mg/dL — AB (ref 65–99)
POTASSIUM: 4.6 mmol/L (ref 3.5–5.2)
SODIUM: 142 mmol/L (ref 134–144)

## 2016-07-03 ENCOUNTER — Telehealth: Payer: Self-pay | Admitting: Family Medicine

## 2016-07-03 DIAGNOSIS — R7309 Other abnormal glucose: Secondary | ICD-10-CM

## 2016-07-03 NOTE — Telephone Encounter (Signed)
Phone call Discussed with patient elevated glucose patient will come back tomorrow to check hemoglobin A1c

## 2016-07-09 ENCOUNTER — Other Ambulatory Visit: Payer: 59

## 2016-07-09 DIAGNOSIS — Z131 Encounter for screening for diabetes mellitus: Secondary | ICD-10-CM

## 2016-07-09 LAB — BAYER DCA HB A1C WAIVED: HB A1C: 5.6 % (ref <7.0)

## 2016-11-12 ENCOUNTER — Telehealth: Payer: Self-pay | Admitting: *Deleted

## 2016-11-12 NOTE — Telephone Encounter (Signed)
Attempted to leave message for patient to notify them that it is time to schedule annual low dose lung cancer screening CT scan. However, this option is not available. Will attempt to contact at later date.

## 2016-11-19 ENCOUNTER — Telehealth: Payer: Self-pay | Admitting: *Deleted

## 2016-11-19 DIAGNOSIS — Z87891 Personal history of nicotine dependence: Secondary | ICD-10-CM

## 2016-11-19 DIAGNOSIS — Z122 Encounter for screening for malignant neoplasm of respiratory organs: Secondary | ICD-10-CM

## 2016-11-19 NOTE — Telephone Encounter (Signed)
Notified patient that annual lung cancer screening low dose CT scan is due currently or will be in near future. Confirmed that patient is within the age range of 55-77, and asymptomatic, (no signs or symptoms of lung cancer). Patient denies illness that would prevent curative treatment for lung cancer if found. Verified smoking history, (current, 41 pack year). The shared decision making visit was done 07/28/15. Patient is agreeable for CT scan being scheduled.

## 2016-11-26 ENCOUNTER — Ambulatory Visit
Admission: RE | Admit: 2016-11-26 | Discharge: 2016-11-26 | Disposition: A | Discharge status: Home / Self Care | Payer: 59 | Source: Ambulatory Visit | Attending: Nurse Practitioner | Admitting: Nurse Practitioner

## 2016-11-26 DIAGNOSIS — Z87891 Personal history of nicotine dependence: Secondary | ICD-10-CM | POA: Insufficient documentation

## 2016-11-26 DIAGNOSIS — J439 Emphysema, unspecified: Secondary | ICD-10-CM | POA: Diagnosis not present

## 2016-11-26 DIAGNOSIS — K8020 Calculus of gallbladder without cholecystitis without obstruction: Secondary | ICD-10-CM | POA: Insufficient documentation

## 2016-11-26 DIAGNOSIS — R911 Solitary pulmonary nodule: Secondary | ICD-10-CM | POA: Diagnosis not present

## 2016-11-26 DIAGNOSIS — Z122 Encounter for screening for malignant neoplasm of respiratory organs: Secondary | ICD-10-CM | POA: Diagnosis not present

## 2016-11-26 DIAGNOSIS — I700 Atherosclerosis of aorta: Secondary | ICD-10-CM | POA: Insufficient documentation

## 2016-12-02 ENCOUNTER — Telehealth: Payer: Self-pay | Admitting: *Deleted

## 2016-12-02 NOTE — Telephone Encounter (Signed)
Voicemail left in attempt to notify patient of LDCT lung cancer screening program results with recommendation for 6 month follow up imaging. Also upon return call will be notified of incidental findings noted below and will be encouraged to discuss further with PCP who will receive a copy of this note and/or the CT report.  IMPRESSION: 1. Lung-RADS 3, probably benign findings. Short-term follow-up in 6 months is recommended with repeat low-dose chest CT without contrast (please use the following order, "CT CHEST LCS NODULE FOLLOW-UP W/O CM"). New right apical pulmonary nodule which measures volume derived equivalent diameter 4.4 mm. 2.  Emphysema (ICD10-J43.9). 3.  Aortic Atherosclerosis (ICD10-I70.0). 4. Cholelithiasis.

## 2017-01-02 ENCOUNTER — Other Ambulatory Visit: Payer: Self-pay | Admitting: Family Medicine

## 2017-01-02 DIAGNOSIS — I10 Essential (primary) hypertension: Secondary | ICD-10-CM

## 2017-01-02 DIAGNOSIS — E7800 Pure hypercholesterolemia, unspecified: Secondary | ICD-10-CM

## 2017-01-09 ENCOUNTER — Encounter: Payer: 59 | Admitting: Family Medicine

## 2017-01-26 ENCOUNTER — Telehealth: Payer: Self-pay | Admitting: Family Medicine

## 2017-01-26 DIAGNOSIS — I10 Essential (primary) hypertension: Secondary | ICD-10-CM

## 2017-01-27 NOTE — Telephone Encounter (Signed)
apt 

## 2017-01-28 NOTE — Telephone Encounter (Signed)
Pharmacy called and would like to speak to PCP or CMA for clarification.

## 2017-01-28 NOTE — Telephone Encounter (Signed)
Spoke with [facility] and RX was taken care of. Now for 30 tablets w/ 2 refills.

## 2017-02-05 ENCOUNTER — Other Ambulatory Visit: Payer: Self-pay | Admitting: Family Medicine

## 2017-02-05 DIAGNOSIS — E7800 Pure hypercholesterolemia, unspecified: Secondary | ICD-10-CM

## 2017-02-05 DIAGNOSIS — I10 Essential (primary) hypertension: Secondary | ICD-10-CM

## 2017-02-05 NOTE — Telephone Encounter (Signed)
apt 

## 2017-02-07 ENCOUNTER — Encounter: Payer: Self-pay | Admitting: Family Medicine

## 2017-02-24 ENCOUNTER — Ambulatory Visit (INDEPENDENT_AMBULATORY_CARE_PROVIDER_SITE_OTHER): Payer: 59 | Admitting: Family Medicine

## 2017-02-24 ENCOUNTER — Encounter: Payer: Self-pay | Admitting: Family Medicine

## 2017-02-24 VITALS — BP 136/70 | HR 72 | Ht 73.62 in | Wt 194.0 lb

## 2017-02-24 DIAGNOSIS — Z125 Encounter for screening for malignant neoplasm of prostate: Secondary | ICD-10-CM | POA: Diagnosis not present

## 2017-02-24 DIAGNOSIS — F17210 Nicotine dependence, cigarettes, uncomplicated: Secondary | ICD-10-CM | POA: Diagnosis not present

## 2017-02-24 DIAGNOSIS — Z0000 Encounter for general adult medical examination without abnormal findings: Secondary | ICD-10-CM | POA: Diagnosis not present

## 2017-02-24 DIAGNOSIS — I700 Atherosclerosis of aorta: Secondary | ICD-10-CM | POA: Diagnosis not present

## 2017-02-24 DIAGNOSIS — I739 Peripheral vascular disease, unspecified: Secondary | ICD-10-CM | POA: Diagnosis not present

## 2017-02-24 DIAGNOSIS — I10 Essential (primary) hypertension: Secondary | ICD-10-CM

## 2017-02-24 DIAGNOSIS — E7800 Pure hypercholesterolemia, unspecified: Secondary | ICD-10-CM | POA: Diagnosis not present

## 2017-02-24 DIAGNOSIS — I2510 Atherosclerotic heart disease of native coronary artery without angina pectoris: Secondary | ICD-10-CM

## 2017-02-24 DIAGNOSIS — Z1329 Encounter for screening for other suspected endocrine disorder: Secondary | ICD-10-CM

## 2017-02-24 LAB — MICROSCOPIC EXAMINATION: BACTERIA UA: NONE SEEN

## 2017-02-24 LAB — URINALYSIS, ROUTINE W REFLEX MICROSCOPIC
BILIRUBIN UA: NEGATIVE
Glucose, UA: NEGATIVE
KETONES UA: NEGATIVE
Leukocytes, UA: NEGATIVE
NITRITE UA: NEGATIVE
Protein, UA: NEGATIVE
Specific Gravity, UA: 1.02 (ref 1.005–1.030)
UUROB: 0.2 mg/dL (ref 0.2–1.0)
pH, UA: 6.5 (ref 5.0–7.5)

## 2017-02-24 MED ORDER — BENAZEPRIL HCL 40 MG PO TABS: 40.0000 mg | ORAL_TABLET | Freq: Every day | ORAL | 4 refills | Status: DC

## 2017-02-24 MED ORDER — METOPROLOL SUCCINATE ER 100 MG PO TB24
100.0000 mg | ORAL_TABLET | Freq: Every day | ORAL | 4 refills | Status: DC
Start: 2017-02-24 — End: 2018-03-02

## 2017-02-24 MED ORDER — AMLODIPINE BESYLATE 5 MG PO TABS: 5.0000 mg | ORAL_TABLET | Freq: Every day | ORAL | 4 refills | Status: DC

## 2017-02-24 MED ORDER — ROSUVASTATIN CALCIUM 40 MG PO TABS
40.0000 mg | ORAL_TABLET | Freq: Every day | ORAL | 4 refills | Status: DC
Start: 2017-02-24 — End: 2018-03-02

## 2017-02-24 NOTE — Assessment & Plan Note (Signed)
The current medical regimen is effective;  continue present plan and medications.  

## 2017-02-24 NOTE — Progress Notes (Signed)
BP 136/70 (BP Location: Left Arm)   Pulse 72   Ht 6' 1.62" (1.87 m)   Wt 194 lb (88 kg)   SpO2 98%   BMI 25.16 kg/m    Subjective:    Patient ID: Maxwell Young, male    DOB: 09-Nov-1955, 62 y.o.   MRN: [id number]  HPI: Maxwell Young is a 62 y.o. male  Annual exam  Patient all in all doing well though unfortunately still smoking. No chest pain chest tightness cardiovascular symptoms. Blood pressure also doing well without complaints from medication which takes faithfully. Cholesterol also doing well without problems taking Crestor without issues. Reflux stable with Nexium and takes aspirin every day, along with fish oil. Reviewed low-dose CT scan showing atherosclerosis and gallstones which was reviewed with patient.  Relevant past medical, surgical, family and social history reviewed and updated as indicated. Interim medical history since our last visit reviewed. Allergies and medications reviewed and updated.  Review of Systems  Constitutional: Negative.   HENT: Negative.   Eyes: Negative.   Respiratory: Negative.   Cardiovascular:       Some leg tiredness heaviness cramping type sensation with extensive walking.  Gastrointestinal: Negative.   Endocrine: Negative.   Genitourinary: Negative.   Musculoskeletal: Negative.   Skin: Negative.   Allergic/Immunologic: Negative.   Neurological: Negative.   Hematological: Negative.   Psychiatric/Behavioral: Negative.     Per HPI unless specifically indicated above     Objective:    BP 136/70 (BP Location: Left Arm)   Pulse 72   Ht 6' 1.62" (1.87 m)   Wt 194 lb (88 kg)   SpO2 98%   BMI 25.16 kg/m   Wt Readings from Last 3 Encounters:  02/24/17 194 lb (88 kg)  11/26/16 205 lb (93 kg)  07/01/16 198 lb (89.8 kg)    Physical Exam  Constitutional: He is oriented to person, place, and time. He appears well-developed and well-nourished.  HENT:  Head: Normocephalic and atraumatic.  Right Ear: External ear  normal.  Left Ear: External ear normal.  Eyes: Conjunctivae and EOM are normal. Pupils are equal, round, and reactive to light.  Neck: Normal range of motion. Neck supple.  Cardiovascular: Normal rate, regular rhythm, normal heart sounds and intact distal pulses.  Pulmonary/Chest: Effort normal and breath sounds normal.  Abdominal: Soft. Bowel sounds are normal. There is no splenomegaly or hepatomegaly.  Genitourinary: Rectum normal, prostate normal and penis normal.  Musculoskeletal: Normal range of motion.  Both left and right posterior tibial pulses absent Anterior tibial 1+ left 2+ right  Neurological: He is alert and oriented to person, place, and time. He has normal reflexes.  Skin: No rash noted. No erythema.  Psychiatric: He has a normal mood and affect. His behavior is normal. Judgment and thought content normal.    Results for orders placed or performed in visit on 07/09/16  Bayer DCA Hb A1c Waived  Result Value Ref Range   Bayer DCA Hb A1c Waived 5.6 <7.0 %      Assessment & Plan:   Problem List Items Addressed This Visit      Cardiovascular and Mediastinum   Essential hypertension - Primary    The current medical regimen is effective;  continue present plan and medications.       Relevant Medications   benazepril (LOTENSIN) 40 MG tablet   metoprolol succinate (TOPROL-XL) 100 MG 24 hr tablet   rosuvastatin (CRESTOR) 40 MG tablet   amLODipine (NORVASC) 5 MG  tablet   Other Relevant Orders   CBC with Differential/Platelet   Comprehensive metabolic panel   Lipid panel   Urinalysis, Routine w reflex microscopic   Ambulatory referral to Vascular Surgery   CAD (coronary artery disease)    The current medical regimen is effective;  continue present plan and medications.       Relevant Medications   benazepril (LOTENSIN) 40 MG tablet   metoprolol succinate (TOPROL-XL) 100 MG 24 hr tablet   rosuvastatin (CRESTOR) 40 MG tablet   amLODipine (NORVASC) 5 MG tablet    Other Relevant Orders   Lipid panel   Ambulatory referral to Vascular Surgery   Aortic atherosclerosis ([place])    From CT scan done November 2018      Relevant Medications   benazepril (LOTENSIN) 40 MG tablet   metoprolol succinate (TOPROL-XL) 100 MG 24 hr tablet   rosuvastatin (CRESTOR) 40 MG tablet   amLODipine (NORVASC) 5 MG tablet   Other Relevant Orders   Ambulatory referral to Vascular Surgery   PAD (peripheral artery disease) ([place])    Patient with PAD symptoms and history of coronary artery disease, aortic atherosclerosis and a smoker will refer for further evaluation to vascular surgery.      Relevant Medications   benazepril (LOTENSIN) 40 MG tablet   metoprolol succinate (TOPROL-XL) 100 MG 24 hr tablet   rosuvastatin (CRESTOR) 40 MG tablet   amLODipine (NORVASC) 5 MG tablet   Other Relevant Orders   Ambulatory referral to Vascular Surgery     Other   Hyperlipidemia    The current medical regimen is effective;  continue present plan and medications.       Relevant Medications   benazepril (LOTENSIN) 40 MG tablet   metoprolol succinate (TOPROL-XL) 100 MG 24 hr tablet   rosuvastatin (CRESTOR) 40 MG tablet   amLODipine (NORVASC) 5 MG tablet   Other Relevant Orders   CBC with Differential/Platelet   Comprehensive metabolic panel   Lipid panel   Ambulatory referral to Vascular Surgery   Smoking greater than 40 pack years    Discussed and encouraged smoking cessation      Relevant Orders   Ambulatory referral to Vascular Surgery    Other Visit Diagnoses    Thyroid disorder screen       Relevant Orders   PSA   Prostate cancer screening       Relevant Orders   TSH   PE (physical exam), annual           Follow up plan: Return in about 6 months (around 08/24/2017) for BMP,  Lipids, ALT, AST.

## 2017-02-24 NOTE — Assessment & Plan Note (Signed)
Patient with PAD symptoms and history of coronary artery disease, aortic atherosclerosis and a smoker will refer for further evaluation to vascular surgery.

## 2017-02-24 NOTE — Assessment & Plan Note (Signed)
From CT scan done November 2018

## 2017-02-24 NOTE — Assessment & Plan Note (Signed)
Discussed and encouraged smoking cessation

## 2017-02-25 ENCOUNTER — Encounter: Payer: Self-pay | Admitting: Family Medicine

## 2017-02-25 LAB — LIPID PANEL
Chol/HDL Ratio: 4 ratio (ref 0.0–5.0)
Cholesterol, Total: 148 mg/dL (ref 100–199)
HDL: 37 mg/dL — AB (ref >39)
LDL CALC: 46 mg/dL (ref 0–99)
Triglycerides: 327 mg/dL — ABNORMAL HIGH (ref 0–149)
VLDL CHOLESTEROL CAL: 65 mg/dL — AB (ref 5–40)

## 2017-02-25 LAB — CBC WITH DIFFERENTIAL/PLATELET
BASOS ABS: 0 10*3/uL (ref 0.0–0.2)
Basos: 0 %
EOS (ABSOLUTE): 0.1 10*3/uL (ref 0.0–0.4)
Eos: 1 %
HEMOGLOBIN: 14.7 g/dL (ref 13.0–17.7)
Hematocrit: 42.5 % (ref 37.5–51.0)
IMMATURE GRANS (ABS): 0 10*3/uL (ref 0.0–0.1)
IMMATURE GRANULOCYTES: 0 %
Lymphocytes Absolute: 1.8 10*3/uL (ref 0.7–3.1)
Lymphs: 20 %
MCH: 33 pg (ref 26.6–33.0)
MCHC: 34.6 g/dL (ref 31.5–35.7)
MCV: 95 fL (ref 79–97)
Monocytes Absolute: 0.8 10*3/uL (ref 0.1–0.9)
Monocytes: 9 %
NEUTROS ABS: 6.3 10*3/uL (ref 1.4–7.0)
Neutrophils: 70 %
Platelets: 157 10*3/uL (ref 150–379)
RBC: 4.46 x10E6/uL (ref 4.14–5.80)
RDW: 13.5 % (ref 12.3–15.4)
WBC: 9 10*3/uL (ref 3.4–10.8)

## 2017-02-25 LAB — COMPREHENSIVE METABOLIC PANEL
ALBUMIN: 4.3 g/dL (ref 3.5–5.5)
ALT: 27 IU/L (ref 0–44)
AST: 21 IU/L (ref 0–40)
Albumin/Globulin Ratio: 2 (ref 1.2–2.2)
Alkaline Phosphatase: 87 IU/L (ref 39–117)
BUN / CREAT RATIO: 14 (ref 9–20)
BUN: 14 mg/dL (ref 6–24)
Bilirubin Total: 0.2 mg/dL (ref 0.0–1.2)
CO2: 24 mmol/L (ref 20–29)
CREATININE: 1.01 mg/dL (ref 0.76–1.27)
Calcium: 9.2 mg/dL (ref 8.7–10.2)
Chloride: 104 mmol/L (ref 96–106)
GFR calc non Af Amer: 81 mL/min/{1.73_m2} (ref >59)
GFR, EST AFRICAN AMERICAN: 94 mL/min/{1.73_m2} (ref >59)
GLUCOSE: 156 mg/dL — AB (ref 65–99)
Globulin, Total: 2.1 g/dL (ref 1.5–4.5)
Potassium: 4.1 mmol/L (ref 3.5–5.2)
Sodium: 141 mmol/L (ref 134–144)
TOTAL PROTEIN: 6.4 g/dL (ref 6.0–8.5)

## 2017-02-25 LAB — TSH: TSH: 2.4 u[IU]/mL (ref 0.450–4.500)

## 2017-02-25 LAB — PSA: PROSTATE SPECIFIC AG, SERUM: 2.3 ng/mL (ref 0.0–4.0)

## 2017-03-19 ENCOUNTER — Ambulatory Visit (INDEPENDENT_AMBULATORY_CARE_PROVIDER_SITE_OTHER): Payer: 59 | Admitting: Vascular Surgery

## 2017-03-19 ENCOUNTER — Encounter (INDEPENDENT_AMBULATORY_CARE_PROVIDER_SITE_OTHER): Payer: Self-pay | Admitting: Vascular Surgery

## 2017-03-19 VITALS — BP 145/67 | HR 79 | Resp 18 | Ht 73.0 in | Wt 198.8 lb

## 2017-03-19 DIAGNOSIS — E7800 Pure hypercholesterolemia, unspecified: Secondary | ICD-10-CM

## 2017-03-19 DIAGNOSIS — I739 Peripheral vascular disease, unspecified: Secondary | ICD-10-CM | POA: Diagnosis not present

## 2017-03-19 DIAGNOSIS — F17210 Nicotine dependence, cigarettes, uncomplicated: Secondary | ICD-10-CM | POA: Diagnosis not present

## 2017-03-19 NOTE — Progress Notes (Signed)
Subjective:    Patient ID: Maxwell Young, male    DOB: 05/04/1955, 62 y.o.   MRN: [id number] Chief Complaint  Patient presents with  . New Patient (Initial Visit)    ref Everardo Beals for PAD   Presents as a new patient referred by Dr. Freddi Starr for evaluation of peripheral artery disease.  The patient endorses a "few months" of aggressively worsening "calf tightness".  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  The patient notes a shortening of his claudication distance.  The patient's calf discomfort has progressed to the point he is unable to function on a daily basis.  He feels that his symptoms are lifestyle limiting.  The patient does have a past medical history of coronary artery disease requiring bypass surgery.  The patient is an active smoker.  The patient denies any rest pain or ulceration to the lower extremity.  The patient denies any fever, nausea or vomiting.   Review of Systems  Constitutional: Negative.   HENT: Negative.   Eyes: Negative.   Respiratory: Negative.   Cardiovascular:       Bilateral calf claudication  Gastrointestinal: Negative.   Endocrine: Negative.   Genitourinary: Negative.   Musculoskeletal: Negative.   Skin: Negative.   Allergic/Immunologic: Negative.   Neurological: Negative.   Hematological: Negative.   Psychiatric/Behavioral: Negative.       Objective:   Physical Exam  Constitutional: He is oriented to person, place, and time. He appears well-developed and well-nourished. No distress.  HENT:  Head: Normocephalic and atraumatic.  Eyes: Conjunctivae are normal. Pupils are equal, round, and reactive to light.  Neck: Normal range of motion.  Cardiovascular: Normal rate, regular rhythm, normal heart sounds and intact distal pulses.  Pulses:      Radial pulses are 2+ on the right side, and 2+ on the left side.       Posterior tibial pulses are 1+ on the right side, and 1+ on  the left side.  Pulmonary/Chest: Effort normal and breath sounds normal.  Musculoskeletal: Normal range of motion. He exhibits no edema.  Neurological: He is alert and oriented to person, place, and time.  Skin: Skin is warm and dry. He is not diaphoretic.  Psychiatric: He has a normal mood and affect. His behavior is normal. Judgment and thought content normal.  Vitals reviewed.  BP (!) 145/67 (BP Location: Right Arm)   Pulse 79   Resp 18   Ht 6\' 1"  (1.854 m)   Wt 198 lb 12.8 oz (90.2 kg)   BMI 26.23 kg/m   Past Medical History:  Diagnosis Date  . CAD (coronary artery disease)   . Colon polyps 2011  . Diverticula of colon 2011  . Elevated fasting glucose   . GERD (gastroesophageal reflux disease)   . History of hemorrhoids 2011   internal  . Hyperlipidemia   . Hypertension   . Personal history of tobacco use, presenting hazards to health 07/27/2015  . Wears dentures    full upper and lower   Social History   Socioeconomic History  . Marital status: Married    Spouse name: Not on file  . Number of children: Not on file  . Years of education: Not on file  . Highest education level: Not on file  Social Needs  . Financial resource strain: Not on file  . Food insecurity - worry: Not on file  . Food insecurity -  inability: Not on file  . Transportation needs - medical: Not on file  . Transportation needs - non-medical: Not on file  Occupational History  . Not on file  Tobacco Use  . Smoking status: Current Every Day Smoker    Packs/day: 1.00    Years: 40.00    Pack years: 40.00    Types: Cigarettes  . Smokeless tobacco: Never Used  Substance and Sexual Activity  . Alcohol use: No  . Drug use: No  . Sexual activity: Not on file  Other Topics Concern  . Not on file  Social History Narrative  . Not on file   Past Surgical History:  Procedure Laterality Date  . CERVICAL FUSION  06/09/003  . COLONOSCOPY    . COLONOSCOPY WITH PROPOFOL N/A 07/24/2015   Procedure:  COLONOSCOPY WITH PROPOFOL;  Surgeon: Lucilla Lame, MD;  Location: [facility];  Service: Endoscopy;  Laterality: N/A;  . CORONARY ARTERY BYPASS GRAFT  2008   [facility] - 3 vessel  . POLYPECTOMY  07/24/2015   Procedure: POLYPECTOMY;  Surgeon: Lucilla Lame, MD;  Location: [facility];  Service: Endoscopy;;   Family History  Problem Relation Age of Onset  . Hypertension Mother   . Heart disease Mother        CAD  . Cancer Father        colon  . Stroke Father   . Parkinson's disease Father    No Known Allergies     Assessment & Plan:  Presents as a new patient referred by Dr. Freddi Starr for evaluation of peripheral artery disease.  The patient endorses a "few months" of aggressively worsening "calf tightness".  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  The patient notes a shortening of his claudication distance.  The patient's calf discomfort has progressed to the point he is unable to function on a daily basis.  He feels that his symptoms are lifestyle limiting.  The patient does have a past medical history of coronary artery disease requiring bypass surgery.  The patient is an active smoker.  The patient denies any rest pain or ulceration to the lower extremity.  The patient denies any fever, nausea or vomiting.  1. PAD (peripheral artery disease) ([place]) - New Presents as a new patient with progressively worsening calf claudication The patient's symptoms have progressed to the point that it is interfering with his ability to function on a daily basis The patient has multiple risk factors for peripheral artery disease I will bring the patient back at his convenience and undergo a bilateral ABI to assess for any contributing peripheral artery disease I have discussed with the patient at length the risk factors for and pathogenesis of atherosclerotic disease and encouraged a healthy diet, regular exercise regimen and  blood pressure / glucose control.  The patient was encouraged to call the office in the interim if he experiences any claudication like symptoms, rest pain or ulcers to his feet / toes.  - VAS [place] ABI WITH/WO TBI; Future  2. Smoking greater than 40 pack years - Stable We had a discussion for approximately 10 minutes regarding the absolute need for smoking cessation due to the deleterious nature of tobacco on the vascular system. We discussed the tobacco use would diminish patency of any intervention, and likely significantly worsen progressio of disease. We discussed multiple agents for quitting including replacement therapy or medications to reduce cravings such as  Chantix. The patient voices their understanding of the importance of smoking cessation.  3. Pure hypercholesterolemia - Stable Encouraged good control as its slows the progression of atherosclerotic disease  Current Outpatient Medications on File Prior to Visit  Medication Sig Dispense Refill  . amLODipine (NORVASC) 5 MG tablet Take 1 tablet (5 mg total) by mouth daily. 90 tablet 4  . aspirin EC 325 MG tablet Take 325 mg by mouth daily.    . benazepril (LOTENSIN) 40 MG tablet Take 1 tablet (40 mg total) by mouth daily. 90 tablet 4  . colesevelam (WELCHOL) 625 MG tablet TAKE 3 TABLETS BY MOUTH TWO TIMES DAILY 540 tablet 4  . esomeprazole (NEXIUM) 20 MG capsule Take 20 mg by mouth daily at 12 noon.    Marland Kitchen KRILL OIL PO Take by mouth.    . metoprolol succinate (TOPROL-XL) 100 MG 24 hr tablet Take 1 tablet (100 mg total) by mouth daily. Take with or immediately following a meal. 90 tablet 4  . Multiple Vitamins-Minerals (MULTIVITAMIN ADULT PO) Take by mouth daily.    . rosuvastatin (CRESTOR) 40 MG tablet Take 1 tablet (40 mg total) by mouth daily. 90 tablet 4   No current facility-administered medications on file prior to visit.    There are no Patient Instructions on file for this visit. No Follow-up on file.  Edee Nifong A Stacee Earp,  PA-C

## 2017-04-02 ENCOUNTER — Ambulatory Visit (INDEPENDENT_AMBULATORY_CARE_PROVIDER_SITE_OTHER): Payer: 59 | Admitting: Vascular Surgery

## 2017-04-02 ENCOUNTER — Ambulatory Visit (INDEPENDENT_AMBULATORY_CARE_PROVIDER_SITE_OTHER): Payer: 59

## 2017-04-02 ENCOUNTER — Encounter (INDEPENDENT_AMBULATORY_CARE_PROVIDER_SITE_OTHER): Payer: Self-pay | Admitting: Vascular Surgery

## 2017-04-02 VITALS — BP 138/86 | HR 71 | Resp 16 | Wt 198.4 lb

## 2017-04-02 DIAGNOSIS — I739 Peripheral vascular disease, unspecified: Secondary | ICD-10-CM

## 2017-04-02 DIAGNOSIS — E7800 Pure hypercholesterolemia, unspecified: Secondary | ICD-10-CM | POA: Diagnosis not present

## 2017-04-02 DIAGNOSIS — F17210 Nicotine dependence, cigarettes, uncomplicated: Secondary | ICD-10-CM | POA: Diagnosis not present

## 2017-04-02 NOTE — Progress Notes (Signed)
Subjective:    Patient ID: Maxwell Young, male    DOB: [birth year], 62 y.o.   MRN: [id number] Chief Complaint  Patient presents with  . Follow-up    pt conv abi   Patient presents to review vascular studies.  The patient was last seen on March 19, 2017 for evaluation of bilateral lower extremity calf claudication.  The patient's symptoms remained stable.  He denies any worsening of his symptoms.  The patient underwent a bilateral ABI which was notable for triphasic tibials.  No evidence of significant bilateral lower extremity arterial disease.  Right ABI 1.14 and left ABI 0.98 the patient denies any fever, nausea or vomiting.   Review of Systems  Constitutional: Negative.   HENT: Negative.   Eyes: Negative.   Respiratory: Negative.   Cardiovascular:       Claudication  Gastrointestinal: Negative.   Endocrine: Negative.   Genitourinary: Negative.   Musculoskeletal: Negative.   Skin: Negative.   Allergic/Immunologic: Negative.   Neurological: Negative.   Hematological: Negative.   Psychiatric/Behavioral: Negative.       Objective:   Physical Exam  Constitutional: He is oriented to person, place, and time. He appears well-developed and well-nourished. No distress.  HENT:  Head: Normocephalic and atraumatic.  Eyes: Conjunctivae are normal. Pupils are equal, round, and reactive to light.  Neck: Normal range of motion.  Cardiovascular: Normal rate, regular rhythm, normal heart sounds and intact distal pulses.  Pulses:      Radial pulses are 2+ on the right side, and 2+ on the left side.       Dorsalis pedis pulses are 1+ on the right side, and 1+ on the left side.       Posterior tibial pulses are 1+ on the right side, and 1+ on the left side.  Pulmonary/Chest: Effort normal and breath sounds normal.  Musculoskeletal: Normal range of motion. He exhibits no edema.  Neurological: He is alert and oriented to person, place, and time.  Skin: Skin is warm and dry. He is not  diaphoretic.  Psychiatric: He has a normal mood and affect. His behavior is normal. Judgment and thought content normal.  Vitals reviewed.  BP 138/86 (BP Location: Right Arm)   Pulse 71   Resp 16   Wt 198 lb 6.4 oz (90 kg)   BMI 26.18 kg/m   Past Medical History:  Diagnosis Date  . CAD (coronary artery disease)   . Colon polyps 2011  . Diverticula of colon 2011  . Elevated fasting glucose   . GERD (gastroesophageal reflux disease)   . History of hemorrhoids 2011   internal  . Hyperlipidemia   . Hypertension   . Personal history of tobacco use, presenting hazards to health 07/27/2015  . Wears dentures    full upper and lower   Social History   Socioeconomic History  . Marital status: Married    Spouse name: Not on file  . Number of children: Not on file  . Years of education: Not on file  . Highest education level: Not on file  Social Needs  . Financial resource strain: Not on file  . Food insecurity - worry: Not on file  . Food insecurity - inability: Not on file  . Transportation needs - medical: Not on file  . Transportation needs - non-medical: Not on file  Occupational History  . Not on file  Tobacco Use  . Smoking status: Current Every Day Smoker    Packs/day: 1.00  Years: 40.00    Pack years: 40.00    Types: Cigarettes  . Smokeless tobacco: Never Used  Substance and Sexual Activity  . Alcohol use: No  . Drug use: No  . Sexual activity: Not on file  Other Topics Concern  . Not on file  Social History Narrative  . Not on file   Past Surgical History:  Procedure Laterality Date  . CERVICAL FUSION  06/09/003  . COLONOSCOPY    . COLONOSCOPY WITH PROPOFOL N/A 07/24/2015   Procedure: COLONOSCOPY WITH PROPOFOL;  Surgeon: Lucilla Lame, MD;  Location: [place];  Service: Endoscopy;  Laterality: N/A;  . CORONARY ARTERY BYPASS GRAFT  2008   [facility] - 3 vessel  . POLYPECTOMY  07/24/2015   Procedure: POLYPECTOMY;  Surgeon: Lucilla Lame, MD;  Location:  [facility];  Service: Endoscopy;;   Family History  Problem Relation Age of Onset  . Hypertension Mother   . Heart disease Mother        CAD  . Cancer Father        colon  . Stroke Father   . Parkinson's disease Father    No Known Allergies     Assessment & Plan:  Patient presents to review vascular studies.  The patient was last seen on March 19, 2017 for evaluation of bilateral lower extremity calf claudication.  The patient's symptoms remained stable.  He denies any worsening of his symptoms.  The patient underwent a bilateral ABI which was notable for triphasic tibials.  No evidence of significant bilateral lower extremity arterial disease.  Right ABI 1.14 and left ABI 0.98 the patient denies any fever, nausea or vomiting.  1. PAD (peripheral artery disease) ([place]) - Stable Patient presents today to review vascular studies The patient is experiencing bilateral calf claudication when walking long distances These symptoms have remained stable The patient underwent an ABI which was notable for no evidence of bilateral lower extremity arterial disease. Recommend the patient follow-up with primary care physician to rule out any degenerative disc disease or osteoarthritis that may be contributing I have discussed with the patient at length the risk factors for and pathogenesis of atherosclerotic disease and encouraged a healthy diet, regular exercise regimen and blood pressure / glucose control.  The patient was encouraged to call the office in the interim if he experiences any claudication like symptoms, rest pain or ulcers to his feet / toes.  2. Smoking greater than 40 pack years - Stable We had a discussion for approximately 10 minutes regarding the absolute need for smoking cessation due to the deleterious nature of tobacco on the vascular system. We discussed the tobacco use would diminish patency of any intervention, and likely significantly worsen progressio of disease.  We discussed multiple agents for quitting including replacement therapy or medications to reduce cravings such as Chantix. The patient voices their understanding of the importance of smoking cessation.  3. Pure hypercholesterolemia - Stable Encouraged good control as its slows the progression of atherosclerotic disease  Current Outpatient Medications on File Prior to Visit  Medication Sig Dispense Refill  . amLODipine (NORVASC) 5 MG tablet Take 1 tablet (5 mg total) by mouth daily. 90 tablet 4  . aspirin EC 325 MG tablet Take 325 mg by mouth daily.    . benazepril (LOTENSIN) 40 MG tablet Take 1 tablet (40 mg total) by mouth daily. 90 tablet 4  . colesevelam (WELCHOL) 625 MG tablet TAKE 3 TABLETS BY MOUTH TWO TIMES DAILY 540 tablet 4  .  esomeprazole (NEXIUM) 20 MG capsule Take 20 mg by mouth daily at 12 noon.    Marland Kitchen KRILL OIL PO Take by mouth.    . metoprolol succinate (TOPROL-XL) 100 MG 24 hr tablet Take 1 tablet (100 mg total) by mouth daily. Take with or immediately following a meal. 90 tablet 4  . Multiple Vitamins-Minerals (MULTIVITAMIN ADULT PO) Take by mouth daily.    . rosuvastatin (CRESTOR) 40 MG tablet Take 1 tablet (40 mg total) by mouth daily. 90 tablet 4   No current facility-administered medications on file prior to visit.    There are no Patient Instructions on file for this visit. No Follow-up on file.  Dionel Archey A Jermiya Reichl, PA-C

## 2017-06-02 ENCOUNTER — Telehealth: Payer: Self-pay | Admitting: *Deleted

## 2017-06-02 NOTE — Telephone Encounter (Signed)
Attempted to leave message for patient to notify them that it is time to schedule low dose lung cancer screening CT scan.However, this option is not available. Will try again at a later date.

## 2017-06-09 ENCOUNTER — Telehealth: Payer: Self-pay | Admitting: *Deleted

## 2017-06-09 NOTE — Telephone Encounter (Signed)
Continue to be unable to leave a voicemail for patient. Will mail notification.

## 2017-06-10 ENCOUNTER — Encounter: Payer: Self-pay | Admitting: *Deleted

## 2017-06-12 ENCOUNTER — Telehealth: Payer: Self-pay | Admitting: *Deleted

## 2017-06-12 DIAGNOSIS — R918 Other nonspecific abnormal finding of lung field: Secondary | ICD-10-CM

## 2017-06-12 DIAGNOSIS — Z122 Encounter for screening for malignant neoplasm of respiratory organs: Secondary | ICD-10-CM

## 2017-06-12 DIAGNOSIS — Z87891 Personal history of nicotine dependence: Secondary | ICD-10-CM

## 2017-06-12 NOTE — Telephone Encounter (Signed)
Notified patient that lung cancer screening low dose CT scan follow up is due currently or will be in near future. Confirmed that patient is within the age range of 55-77, and asymptomatic, (no signs or symptoms of lung cancer). Patient denies illness that would prevent curative treatment for lung cancer if found. Verified smoking history, (current, 42 pack year). The shared decision making visit was done 07/28/15. Patient is agreeable for CT scan being scheduled.

## 2017-06-18 ENCOUNTER — Ambulatory Visit
Admission: RE | Admit: 2017-06-18 | Discharge: 2017-06-18 | Disposition: A | Discharge status: Home / Self Care | Payer: 59 | Source: Ambulatory Visit | Attending: Oncology | Admitting: Oncology

## 2017-06-18 DIAGNOSIS — I700 Atherosclerosis of aorta: Secondary | ICD-10-CM | POA: Diagnosis not present

## 2017-06-18 DIAGNOSIS — R918 Other nonspecific abnormal finding of lung field: Secondary | ICD-10-CM

## 2017-06-18 DIAGNOSIS — J438 Other emphysema: Secondary | ICD-10-CM | POA: Insufficient documentation

## 2017-06-18 DIAGNOSIS — I2510 Atherosclerotic heart disease of native coronary artery without angina pectoris: Secondary | ICD-10-CM | POA: Insufficient documentation

## 2017-06-18 DIAGNOSIS — K8020 Calculus of gallbladder without cholecystitis without obstruction: Secondary | ICD-10-CM | POA: Insufficient documentation

## 2017-06-18 DIAGNOSIS — Z122 Encounter for screening for malignant neoplasm of respiratory organs: Secondary | ICD-10-CM

## 2017-06-18 DIAGNOSIS — Z951 Presence of aortocoronary bypass graft: Secondary | ICD-10-CM | POA: Diagnosis not present

## 2017-06-18 DIAGNOSIS — J432 Centrilobular emphysema: Secondary | ICD-10-CM | POA: Insufficient documentation

## 2017-06-18 DIAGNOSIS — Z87891 Personal history of nicotine dependence: Secondary | ICD-10-CM | POA: Diagnosis not present

## 2017-06-23 ENCOUNTER — Encounter: Payer: Self-pay | Admitting: *Deleted

## 2017-08-26 ENCOUNTER — Ambulatory Visit (INDEPENDENT_AMBULATORY_CARE_PROVIDER_SITE_OTHER): Payer: 59 | Admitting: Family Medicine

## 2017-08-26 ENCOUNTER — Encounter: Payer: Self-pay | Admitting: Family Medicine

## 2017-08-26 VITALS — BP 134/82 | HR 75 | Ht 72.0 in | Wt 195.0 lb

## 2017-08-26 DIAGNOSIS — I10 Essential (primary) hypertension: Secondary | ICD-10-CM | POA: Diagnosis not present

## 2017-08-26 DIAGNOSIS — J439 Emphysema, unspecified: Secondary | ICD-10-CM | POA: Insufficient documentation

## 2017-08-26 DIAGNOSIS — I2510 Atherosclerotic heart disease of native coronary artery without angina pectoris: Secondary | ICD-10-CM

## 2017-08-26 DIAGNOSIS — I739 Peripheral vascular disease, unspecified: Secondary | ICD-10-CM

## 2017-08-26 DIAGNOSIS — E7800 Pure hypercholesterolemia, unspecified: Secondary | ICD-10-CM

## 2017-08-26 DIAGNOSIS — F17210 Nicotine dependence, cigarettes, uncomplicated: Secondary | ICD-10-CM

## 2017-08-26 LAB — LP+ALT+AST PICCOLO, WAIVED
ALT (SGPT) Piccolo, Waived: 35 U/L (ref 10–47)
AST (SGOT) PICCOLO, WAIVED: 30 U/L (ref 11–38)
Cholesterol Piccolo, Waived: 165 mg/dL (ref <200)
TRIGLYCERIDES PICCOLO,WAIVED: 412 mg/dL — AB (ref <150)

## 2017-08-26 NOTE — Assessment & Plan Note (Signed)
Encouraged to quit smoking again

## 2017-08-26 NOTE — Progress Notes (Signed)
BP 134/82   Pulse 75   Ht 6' (1.829 m)   Wt 195 lb (88.5 kg)   SpO2 97%   BMI 26.45 kg/m    Subjective:    Patient ID: Maxwell Young, male    DOB: [birth year], 62 y.o.   MRN: [id number]  HPI: Maxwell Young is a 62 y.o. male  Chief Complaint  Patient presents with  . Follow-up  . Hypertension  . Hyperlipidemia  All in all doing well no complaints still smoking of course and still fussed at patient about smoking of course. Blood pressure doing well no complaints from medications cholesterol also doing well with no complaints from medications. No chest pain chest tightness no leg issues or concerns from PAD. Reviewed again lung CT low-dose screening which was negative for pulmonary disease other than COPD emphysema. Relevant past medical, surgical, family and social history reviewed and updated as indicated. Interim medical history since our last visit reviewed. Allergies and medications reviewed and updated.  Review of Systems  Constitutional: Negative.   Respiratory: Negative.   Cardiovascular: Negative.     Per HPI unless specifically indicated above     Objective:    BP 134/82   Pulse 75   Ht 6' (1.829 m)   Wt 195 lb (88.5 kg)   SpO2 97%   BMI 26.45 kg/m   Wt Readings from Last 3 Encounters:  08/26/17 195 lb (88.5 kg)  04/02/17 198 lb 6.4 oz (90 kg)  03/19/17 198 lb 12.8 oz (90.2 kg)    Physical Exam  Constitutional: He is oriented to person, place, and time. He appears well-developed and well-nourished.  HENT:  Head: Normocephalic and atraumatic.  Eyes: Conjunctivae and EOM are normal.  Neck: Normal range of motion.  Cardiovascular: Normal rate, regular rhythm and normal heart sounds.  Pulmonary/Chest: Effort normal and breath sounds normal.  Musculoskeletal: Normal range of motion.  Neurological: He is alert and oriented to person, place, and time.  Skin: No erythema.  Psychiatric: He has a normal mood and affect. His behavior is normal. Judgment  and thought content normal.    Results for orders placed or performed in visit on 02/24/17  Microscopic Examination  Result Value Ref Range   WBC, UA 0-5 0 - 5 /hpf   RBC, UA 0-2 0 - 2 /hpf   Epithelial Cells (non renal) CANCELED    Bacteria, UA None seen None seen/Few  CBC with Differential/Platelet  Result Value Ref Range   WBC 9.0 3.4 - 10.8 x10E3/uL   RBC 4.46 4.14 - 5.80 x10E6/uL   Hemoglobin 14.7 13.0 - 17.7 g/dL   Hematocrit 42.5 37.5 - 51.0 %   MCV 95 79 - 97 fL   MCH 33.0 26.6 - 33.0 pg   MCHC 34.6 31.5 - 35.7 g/dL   RDW 13.5 12.3 - 15.4 %   Platelets 157 150 - 379 x10E3/uL   Neutrophils 70 Not Estab. %   Lymphs 20 Not Estab. %   Monocytes 9 Not Estab. %   Eos 1 Not Estab. %   Basos 0 Not Estab. %   Neutrophils Absolute 6.3 1.4 - 7.0 x10E3/uL   Lymphocytes Absolute 1.8 0.7 - 3.1 x10E3/uL   Monocytes Absolute 0.8 0.1 - 0.9 x10E3/uL   EOS (ABSOLUTE) 0.1 0.0 - 0.4 x10E3/uL   Basophils Absolute 0.0 0.0 - 0.2 x10E3/uL   Immature Granulocytes 0 Not Estab. %   Immature Grans (Abs) 0.0 0.0 - 0.1 x10E3/uL  Comprehensive metabolic panel  Result Value Ref Range   Glucose 156 (H) 65 - 99 mg/dL   BUN 14 6 - 24 mg/dL   Creatinine, Ser 1.01 0.76 - 1.27 mg/dL   GFR calc non Af Amer 81 >59 mL/min/1.73   GFR calc Af Amer 94 >59 mL/min/1.73   BUN/Creatinine Ratio 14 9 - 20   Sodium 141 134 - 144 mmol/L   Potassium 4.1 3.5 - 5.2 mmol/L   Chloride 104 96 - 106 mmol/L   CO2 24 20 - 29 mmol/L   Calcium 9.2 8.7 - 10.2 mg/dL   Total Protein 6.4 6.0 - 8.5 g/dL   Albumin 4.3 3.5 - 5.5 g/dL   Globulin, Total 2.1 1.5 - 4.5 g/dL   Albumin/Globulin Ratio 2.0 1.2 - 2.2   Bilirubin Total 0.2 0.0 - 1.2 mg/dL   Alkaline Phosphatase 87 39 - 117 IU/L   AST 21 0 - 40 IU/L   ALT 27 0 - 44 IU/L  Lipid panel  Result Value Ref Range   Cholesterol, Total 148 100 - 199 mg/dL   Triglycerides 327 (H) 0 - 149 mg/dL   HDL 37 (L) >39 mg/dL   VLDL Cholesterol Cal 65 (H) 5 - 40 mg/dL   LDL  Calculated 46 0 - 99 mg/dL   Chol/HDL Ratio 4.0 0.0 - 5.0 ratio  PSA  Result Value Ref Range   Prostate Specific Ag, Serum 2.3 0.0 - 4.0 ng/mL  TSH  Result Value Ref Range   TSH 2.400 0.450 - 4.500 uIU/mL  Urinalysis, Routine w reflex microscopic  Result Value Ref Range   Specific Gravity, UA 1.020 1.005 - 1.030   pH, UA 6.5 5.0 - 7.5   Color, UA Yellow Yellow   Appearance Ur Clear Clear   Leukocytes, UA Negative Negative   Protein, UA Negative Negative/Trace   Glucose, UA Negative Negative   Ketones, UA Negative Negative   RBC, UA Trace (A) Negative   Bilirubin, UA Negative Negative   Urobilinogen, Ur 0.2 0.2 - 1.0 mg/dL   Nitrite, UA Negative Negative   Microscopic Examination See below:       Assessment & Plan:   Problem List Items Addressed This Visit      Cardiovascular and Mediastinum   Essential hypertension - Primary    The current medical regimen is effective;  continue present plan and medications.       Relevant Orders   Basic metabolic panel   LP+ALT+AST Piccolo, Waived   CAD (coronary artery disease)    The current medical regimen is effective;  continue present plan and medications.       PAD (peripheral artery disease) (HCC)    The current medical regimen is effective;  continue present plan and medications.         Respiratory   COPD (chronic obstructive pulmonary disease) with emphysema (HCC)    stable        Other   Hyperlipidemia    Discussed cholesterol and poor control with elevation of cholesterol and triglycerides patient with a diet nutrition exercise      Relevant Orders   Basic metabolic panel   LP+ALT+AST Piccolo, Waived   Smoking greater than 40 pack years    Encouraged to quit smoking again          Follow up plan: Return in about 6 months (around 02/26/2018) for Physical Exam.

## 2017-08-26 NOTE — Assessment & Plan Note (Signed)
The current medical regimen is effective;  continue present plan and medications.  

## 2017-08-26 NOTE — Assessment & Plan Note (Signed)
Discussed cholesterol and poor control with elevation of cholesterol and triglycerides patient with a diet nutrition exercise

## 2017-08-26 NOTE — Assessment & Plan Note (Signed)
stable °

## 2017-08-27 ENCOUNTER — Encounter: Payer: Self-pay | Admitting: Family Medicine

## 2017-08-27 LAB — BASIC METABOLIC PANEL
BUN/Creatinine Ratio: 9 — ABNORMAL LOW (ref 10–24)
BUN: 9 mg/dL (ref 8–27)
CALCIUM: 9.1 mg/dL (ref 8.6–10.2)
CO2: 24 mmol/L (ref 20–29)
CREATININE: 1.04 mg/dL (ref 0.76–1.27)
Chloride: 104 mmol/L (ref 96–106)
GFR calc Af Amer: 90 mL/min/{1.73_m2} (ref >59)
GFR calc non Af Amer: 78 mL/min/{1.73_m2} (ref >59)
GLUCOSE: 134 mg/dL — AB (ref 65–99)
Potassium: 4.9 mmol/L (ref 3.5–5.2)
Sodium: 141 mmol/L (ref 134–144)

## 2018-03-02 ENCOUNTER — Encounter: Payer: Self-pay | Admitting: Family Medicine

## 2018-03-02 ENCOUNTER — Ambulatory Visit (INDEPENDENT_AMBULATORY_CARE_PROVIDER_SITE_OTHER): Payer: 59 | Admitting: Family Medicine

## 2018-03-02 VITALS — BP 175/100 | HR 72 | Temp 98.4°F | Ht 71.85 in | Wt 194.6 lb

## 2018-03-02 DIAGNOSIS — Z1329 Encounter for screening for other suspected endocrine disorder: Secondary | ICD-10-CM | POA: Diagnosis not present

## 2018-03-02 DIAGNOSIS — Z125 Encounter for screening for malignant neoplasm of prostate: Secondary | ICD-10-CM

## 2018-03-02 DIAGNOSIS — I700 Atherosclerosis of aorta: Secondary | ICD-10-CM

## 2018-03-02 DIAGNOSIS — I2510 Atherosclerotic heart disease of native coronary artery without angina pectoris: Secondary | ICD-10-CM

## 2018-03-02 DIAGNOSIS — F17210 Nicotine dependence, cigarettes, uncomplicated: Secondary | ICD-10-CM

## 2018-03-02 DIAGNOSIS — E7800 Pure hypercholesterolemia, unspecified: Secondary | ICD-10-CM

## 2018-03-02 DIAGNOSIS — I739 Peripheral vascular disease, unspecified: Secondary | ICD-10-CM

## 2018-03-02 DIAGNOSIS — I10 Essential (primary) hypertension: Secondary | ICD-10-CM

## 2018-03-02 DIAGNOSIS — J439 Emphysema, unspecified: Secondary | ICD-10-CM

## 2018-03-02 LAB — URINALYSIS, ROUTINE W REFLEX MICROSCOPIC
Bilirubin, UA: NEGATIVE
GLUCOSE, UA: NEGATIVE
KETONES UA: NEGATIVE
Leukocytes, UA: NEGATIVE
Nitrite, UA: NEGATIVE
SPEC GRAV UA: 1.02 (ref 1.005–1.030)
Urobilinogen, Ur: 0.2 mg/dL (ref 0.2–1.0)
pH, UA: 6 (ref 5.0–7.5)

## 2018-03-02 LAB — MICROSCOPIC EXAMINATION: Epithelial Cells (non renal): NONE SEEN /hpf (ref 0–10)

## 2018-03-02 MED ORDER — METOPROLOL SUCCINATE ER 100 MG PO TB24: 100.0000 mg | ORAL_TABLET | Freq: Every day | ORAL | 4 refills | Status: DC

## 2018-03-02 MED ORDER — ROSUVASTATIN CALCIUM 40 MG PO TABS: 40.0000 mg | ORAL_TABLET | Freq: Every day | ORAL | 4 refills | Status: DC

## 2018-03-02 MED ORDER — BENAZEPRIL HCL 40 MG PO TABS: 40.0000 mg | ORAL_TABLET | Freq: Every day | ORAL | 4 refills | Status: DC

## 2018-03-02 MED ORDER — AMLODIPINE BESYLATE 5 MG PO TABS
5.0000 mg | ORAL_TABLET | Freq: Every day | ORAL | 4 refills | Status: DC
Start: 2018-03-02 — End: 2018-09-07

## 2018-03-02 NOTE — Assessment & Plan Note (Signed)
Better with not walking on concrete

## 2018-03-02 NOTE — Assessment & Plan Note (Signed)
Discussed stopping 

## 2018-03-02 NOTE — Assessment & Plan Note (Signed)
The current medical regimen is effective;  continue present plan and medications.  

## 2018-03-02 NOTE — Progress Notes (Signed)
BP (!) 175/100   Pulse 72   Temp 98.4 F (36.9 C) (Oral)   Ht 5' 11.85" (1.825 m)   Wt 194 lb 9.6 oz (88.3 kg)   SpO2 99%   BMI 26.50 kg/m    Subjective:    Patient ID: Maxwell Young, male    DOB: 1955/03/09, 63 y.o.   MRN: [id number]  HPI: Maxwell Young is a 64 y.o. male  Chief Complaint  Patient presents with  . Annual Exam  Patient follow-up all in all doing well no complaints taking blood pressure medicine faithfully without problems. Cholesterol medicine doing well may be bothering his legs a little bit but not enough to change. Unfortunately still smoking is following up with chest CT screening exams has one scheduled for May. And legs PAD is doing little better now that he is not on concrete all day.  Relevant past medical, surgical, family and social history reviewed and updated as indicated. Interim medical history since our last visit reviewed. Allergies and medications reviewed and updated.  Review of Systems  Constitutional: Negative.   HENT: Negative.   Eyes: Negative.   Respiratory: Negative.   Cardiovascular: Negative.   Gastrointestinal: Negative.   Endocrine: Negative.   Genitourinary: Negative.   Musculoskeletal: Negative.   Skin: Negative.   Allergic/Immunologic: Negative.   Neurological: Negative.   Hematological: Negative.   Psychiatric/Behavioral: Negative.     Per HPI unless specifically indicated above     Objective:    BP (!) 175/100   Pulse 72   Temp 98.4 F (36.9 C) (Oral)   Ht 5' 11.85" (1.825 m)   Wt 194 lb 9.6 oz (88.3 kg)   SpO2 99%   BMI 26.50 kg/m   Wt Readings from Last 3 Encounters:  03/02/18 194 lb 9.6 oz (88.3 kg)  08/26/17 195 lb (88.5 kg)  04/02/17 198 lb 6.4 oz (90 kg)    Physical Exam Constitutional:      Appearance: He is well-developed.  HENT:     Head: Normocephalic and atraumatic.     Right Ear: External ear normal.     Left Ear: External ear normal.  Eyes:     Conjunctiva/sclera: Conjunctivae  normal.     Pupils: Pupils are equal, round, and reactive to light.  Neck:     Musculoskeletal: Normal range of motion and neck supple.  Cardiovascular:     Rate and Rhythm: Normal rate and regular rhythm.     Heart sounds: Normal heart sounds.  Pulmonary:     Effort: Pulmonary effort is normal.     Breath sounds: Normal breath sounds.  Abdominal:     General: Bowel sounds are normal.     Palpations: Abdomen is soft. There is no hepatomegaly or splenomegaly.  Genitourinary:    Penis: Normal.      Prostate: Normal.     Rectum: Normal.  Musculoskeletal: Normal range of motion.  Skin:    Findings: No erythema or rash.  Neurological:     Mental Status: He is alert and oriented to person, place, and time.     Deep Tendon Reflexes: Reflexes are normal and symmetric.  Psychiatric:        Behavior: Behavior normal.        Thought Content: Thought content normal.        Judgment: Judgment normal.     Results for orders placed or performed in visit on 39/03/00  Basic metabolic panel  Result Value Ref Range  Glucose 134 (H) 65 - 99 mg/dL   BUN 9 8 - 27 mg/dL   Creatinine, Ser 1.04 0.76 - 1.27 mg/dL   GFR calc non Af Amer 78 >59 mL/min/1.73   GFR calc Af Amer 90 >59 mL/min/1.73   BUN/Creatinine Ratio 9 (L) 10 - 24   Sodium 141 134 - 144 mmol/L   Potassium 4.9 3.5 - 5.2 mmol/L   Chloride 104 96 - 106 mmol/L   CO2 24 20 - 29 mmol/L   Calcium 9.1 8.6 - 10.2 mg/dL  LP+ALT+AST Piccolo, Waived  Result Value Ref Range   ALT (SGPT) Piccolo, Waived 35 10 - 47 U/L   AST (SGOT) Piccolo, Waived 30 11 - 38 U/L   Cholesterol Piccolo, Waived 165 <200 mg/dL   HDL Chol Piccolo, Waived CANCELED    Triglycerides Piccolo,Waived 412 (H) <150 mg/dL      Assessment & Plan:   Problem List Items Addressed This Visit      Cardiovascular and Mediastinum   Essential hypertension - Primary    Blood pressure elevated here in the office but patient just took his medications.  Reviewed with patient  that blood pressure medicine should be lasting over 24 hours.  Patient will check his blood pressure in the mornings before medications and see if it is elevated if so may take metoprolol at bedtime.  Checking his blood pressure at other times he is having good control will double check and if still elevated will notify us.      CAD (coronary artery disease)    The current medical regimen is effective;  continue present plan and medications.       Aortic atherosclerosis (HCC)    stable      PAD (peripheral artery disease) (HCC)    Better with not walking on concrete        Respiratory   COPD (chronic obstructive pulmonary disease) with emphysema ([place])    Stable with unfortunately continues to smoke discussed smoking cessation        Other   Hyperlipidemia    The current medical regimen is effective;  continue present plan and medications.       Smoking greater than 40 pack years    Discussed stopping       Other Visit Diagnoses    Thyroid disorder screen       Prostate cancer screening           Follow up plan: Return in about 6 months (around 08/31/2018).

## 2018-03-02 NOTE — Assessment & Plan Note (Signed)
stable °

## 2018-03-02 NOTE — Assessment & Plan Note (Signed)
Blood pressure elevated here in the office but patient just took his medications.  Reviewed with patient that blood pressure medicine should be lasting over 24 hours.  Patient will check his blood pressure in the mornings before medications and see if it is elevated if so may take metoprolol at bedtime.  Checking his blood pressure at other times he is having good control will double check and if still elevated will notify us.

## 2018-03-02 NOTE — Assessment & Plan Note (Signed)
Stable with unfortunately continues to smoke discussed smoking cessation

## 2018-03-03 ENCOUNTER — Telehealth: Payer: Self-pay | Admitting: Family Medicine

## 2018-03-03 LAB — CBC WITH DIFFERENTIAL/PLATELET
BASOS ABS: 0.1 10*3/uL (ref 0.0–0.2)
BASOS: 1 %
EOS (ABSOLUTE): 0.1 10*3/uL (ref 0.0–0.4)
Eos: 1 %
Hematocrit: 49.4 % (ref 37.5–51.0)
Hemoglobin: 16.9 g/dL (ref 13.0–17.7)
Immature Grans (Abs): 0.1 10*3/uL (ref 0.0–0.1)
Immature Granulocytes: 1 %
LYMPHS ABS: 2.1 10*3/uL (ref 0.7–3.1)
LYMPHS: 22 %
MCH: 31 pg (ref 26.6–33.0)
MCHC: 34.2 g/dL (ref 31.5–35.7)
MCV: 91 fL (ref 79–97)
MONOCYTES: 9 %
Monocytes Absolute: 0.9 10*3/uL (ref 0.1–0.9)
NEUTROS PCT: 66 %
Neutrophils Absolute: 6.4 10*3/uL (ref 1.4–7.0)
PLATELETS: 205 10*3/uL (ref 150–450)
RBC: 5.45 x10E6/uL (ref 4.14–5.80)
RDW: 14.8 % (ref 11.6–15.4)
WBC: 9.5 10*3/uL (ref 3.4–10.8)

## 2018-03-03 LAB — COMPREHENSIVE METABOLIC PANEL
ALT: 40 IU/L (ref 0–44)
AST: 26 IU/L (ref 0–40)
Albumin/Globulin Ratio: 1.8 (ref 1.2–2.2)
Albumin: 4.6 g/dL (ref 3.8–4.9)
Alkaline Phosphatase: 102 IU/L (ref 39–117)
BILIRUBIN TOTAL: 0.4 mg/dL (ref 0.0–1.2)
BUN/Creatinine Ratio: 10 (ref 10–24)
BUN: 11 mg/dL (ref 8–27)
CHLORIDE: 100 mmol/L (ref 96–106)
CO2: 22 mmol/L (ref 20–29)
Calcium: 9.4 mg/dL (ref 8.6–10.2)
Creatinine, Ser: 1.07 mg/dL (ref 0.76–1.27)
GFR calc Af Amer: 87 mL/min/{1.73_m2} (ref >59)
GFR calc non Af Amer: 75 mL/min/{1.73_m2} (ref >59)
GLUCOSE: 141 mg/dL — AB (ref 65–99)
Globulin, Total: 2.6 g/dL (ref 1.5–4.5)
Potassium: 3.9 mmol/L (ref 3.5–5.2)
Sodium: 139 mmol/L (ref 134–144)
Total Protein: 7.2 g/dL (ref 6.0–8.5)

## 2018-03-03 LAB — LIPID PANEL
CHOLESTEROL TOTAL: 184 mg/dL (ref 100–199)
Chol/HDL Ratio: 5.6 ratio — ABNORMAL HIGH (ref 0.0–5.0)
HDL: 33 mg/dL — ABNORMAL LOW (ref >39)
Triglycerides: 585 mg/dL (ref 0–149)

## 2018-03-03 LAB — PSA: Prostate Specific Ag, Serum: 2.6 ng/mL (ref 0.0–4.0)

## 2018-03-03 LAB — TSH: TSH: 2.11 u[IU]/mL (ref 0.450–4.500)

## 2018-03-03 NOTE — Telephone Encounter (Signed)
Phone call Discussed with patient elevated triglycerides to better with diet exercise weight loss recheck fasting lipid panel next office visit.

## 2018-03-03 NOTE — Telephone Encounter (Signed)
-----   Message from Georgina Peer, [place] sent at 03/03/2018 11:39 AM EST ----- Phone call.

## 2018-05-19 ENCOUNTER — Other Ambulatory Visit: Payer: Self-pay | Admitting: Family Medicine

## 2018-05-19 DIAGNOSIS — E7800 Pure hypercholesterolemia, unspecified: Secondary | ICD-10-CM

## 2018-07-07 ENCOUNTER — Telehealth: Payer: Self-pay | Admitting: *Deleted

## 2018-07-07 NOTE — Telephone Encounter (Signed)
Attempted to schedule lung screening scan, however, there is no answer or voicemail option .

## 2018-07-09 ENCOUNTER — Telehealth: Payer: Self-pay | Admitting: *Deleted

## 2018-07-09 DIAGNOSIS — Z122 Encounter for screening for malignant neoplasm of respiratory organs: Secondary | ICD-10-CM

## 2018-07-09 DIAGNOSIS — Z87891 Personal history of nicotine dependence: Secondary | ICD-10-CM

## 2018-07-09 NOTE — Telephone Encounter (Signed)
Patient has been notified that annual lung cancer screening low dose CT scan is due currently or will be in near future. Confirmed that patient is within the age range of 55-77, and asymptomatic, (no signs or symptoms of lung cancer). Patient denies illness that would prevent curative treatment for lung cancer if found. Verified smoking history, (current, 43 pack year). The shared decision making visit was done 07/28/15. Patient is agreeable for CT scan being scheduled.

## 2018-07-13 ENCOUNTER — Other Ambulatory Visit: Payer: Self-pay

## 2018-07-13 ENCOUNTER — Ambulatory Visit
Admission: RE | Admit: 2018-07-13 | Discharge: 2018-07-13 | Disposition: A | Discharge status: Home / Self Care | Payer: 59 | Source: Ambulatory Visit | Attending: Oncology | Admitting: Oncology

## 2018-07-13 DIAGNOSIS — Z87891 Personal history of nicotine dependence: Secondary | ICD-10-CM | POA: Insufficient documentation

## 2018-07-13 DIAGNOSIS — Z122 Encounter for screening for malignant neoplasm of respiratory organs: Secondary | ICD-10-CM | POA: Diagnosis not present

## 2018-07-14 ENCOUNTER — Encounter: Payer: Self-pay | Admitting: *Deleted

## 2018-08-31 ENCOUNTER — Ambulatory Visit: Payer: 59 | Admitting: Family Medicine

## 2018-09-07 ENCOUNTER — Other Ambulatory Visit: Payer: Self-pay

## 2018-09-07 ENCOUNTER — Ambulatory Visit: Payer: 59 | Admitting: Family Medicine

## 2018-09-07 ENCOUNTER — Encounter: Payer: Self-pay | Admitting: Family Medicine

## 2018-09-07 ENCOUNTER — Ambulatory Visit (INDEPENDENT_AMBULATORY_CARE_PROVIDER_SITE_OTHER): Payer: 59 | Admitting: Family Medicine

## 2018-09-07 DIAGNOSIS — E7800 Pure hypercholesterolemia, unspecified: Secondary | ICD-10-CM | POA: Diagnosis not present

## 2018-09-07 DIAGNOSIS — R7309 Other abnormal glucose: Secondary | ICD-10-CM | POA: Diagnosis not present

## 2018-09-07 DIAGNOSIS — I10 Essential (primary) hypertension: Secondary | ICD-10-CM | POA: Diagnosis not present

## 2018-09-07 DIAGNOSIS — E1169 Type 2 diabetes mellitus with other specified complication: Secondary | ICD-10-CM | POA: Insufficient documentation

## 2018-09-07 DIAGNOSIS — E119 Type 2 diabetes mellitus without complications: Secondary | ICD-10-CM | POA: Insufficient documentation

## 2018-09-07 MED ORDER — AMLODIPINE BESYLATE 10 MG PO TABS: 10.0000 mg | ORAL_TABLET | Freq: Every day | ORAL | 2 refills | Status: DC

## 2018-09-07 NOTE — Assessment & Plan Note (Signed)
With poor control discussed medications will increase amlodipine from 5 mg to 10 mg patient will start checking his blood pressure more regularly and if elevated still may need to add hydrochlorothiazide. Patient will recheck in 1 month or so.

## 2018-09-07 NOTE — Assessment & Plan Note (Signed)
Discussed blood work showing some elevated glucose will check hemoglobin A1c. Patient states his last glucose was nonfasting.

## 2018-09-07 NOTE — Assessment & Plan Note (Signed)
Will check lipid panel and will do fasting as triglycerides were elevated last time.

## 2018-09-07 NOTE — Progress Notes (Addendum)
BP (!) 175/105    Subjective:    Patient ID: Maxwell Young, male    DOB: [birth year], 63 y.o.   MRN: [id number]  HPI: Maxwell Young is a 63 y.o. male  Med check Discussed with patient multiple issues Elevated blood pressure patient having multiple elevated readings reviewed today blood pressure readings also elevated reviewed medications. Will increase amlodipine from 5 mg to 10 mg and reassess blood pressure in about a month. Reviewed previous labs indicated elevated glucose readings will check hemoglobin A1c. Reviewed cholesterol also with elevated triglycerides may be related to glucose. Previous hemoglobin A1c's were normal that is in the nondiabetic range.  Relevant past medical, surgical, family and social history reviewed and updated as indicated. Interim medical history since our last visit reviewed. Allergies and medications reviewed and updated.  Review of Systems  Constitutional: Negative.   Respiratory: Negative.   Cardiovascular: Negative.     Per HPI unless specifically indicated above     Objective:    BP (!) 175/105   Wt Readings from Last 3 Encounters:  07/13/18 194 lb (88 kg)  03/02/18 194 lb 9.6 oz (88.3 kg)  08/26/17 195 lb (88.5 kg)    Physical Exam  Results for orders placed or performed in visit on 03/02/18  Microscopic Examination   URINE  Result Value Ref Range   WBC, UA 0-5 0 - 5 /hpf   RBC, UA 0-2 0 - 2 /hpf   Epithelial Cells (non renal) None seen 0 - 10 /hpf   Mucus, UA Present (A) Not Estab.   Bacteria, UA Few (A) None seen/Few  CBC with Differential/Platelet  Result Value Ref Range   WBC 9.5 3.4 - 10.8 x10E3/uL   RBC 5.45 4.14 - 5.80 x10E6/uL   Hemoglobin 16.9 13.0 - 17.7 g/dL   Hematocrit 49.4 37.5 - 51.0 %   MCV 91 79 - 97 fL   MCH 31.0 26.6 - 33.0 pg   MCHC 34.2 31.5 - 35.7 g/dL   RDW 14.8 11.6 - 15.4 %   Platelets 205 150 - 450 x10E3/uL   Neutrophils 66 Not Estab. %   Lymphs 22 Not Estab. %   Monocytes 9 Not Estab.  %   Eos 1 Not Estab. %   Basos 1 Not Estab. %   Neutrophils Absolute 6.4 1.4 - 7.0 x10E3/uL   Lymphocytes Absolute 2.1 0.7 - 3.1 x10E3/uL   Monocytes Absolute 0.9 0.1 - 0.9 x10E3/uL   EOS (ABSOLUTE) 0.1 0.0 - 0.4 x10E3/uL   Basophils Absolute 0.1 0.0 - 0.2 x10E3/uL   Immature Granulocytes 1 Not Estab. %   Immature Grans (Abs) 0.1 0.0 - 0.1 x10E3/uL  Comprehensive metabolic panel  Result Value Ref Range   Glucose 141 (H) 65 - 99 mg/dL   BUN 11 8 - 27 mg/dL   Creatinine, Ser 1.07 0.76 - 1.27 mg/dL   GFR calc non Af Amer 75 >59 mL/min/1.73   GFR calc Af Amer 87 >59 mL/min/1.73   BUN/Creatinine Ratio 10 10 - 24   Sodium 139 134 - 144 mmol/L   Potassium 3.9 3.5 - 5.2 mmol/L   Chloride 100 96 - 106 mmol/L   CO2 22 20 - 29 mmol/L   Calcium 9.4 8.6 - 10.2 mg/dL   Total Protein 7.2 6.0 - 8.5 g/dL   Albumin 4.6 3.8 - 4.9 g/dL   Globulin, Total 2.6 1.5 - 4.5 g/dL   Albumin/Globulin Ratio 1.8 1.2 - 2.2   Bilirubin Total 0.4 0.0 -  1.2 mg/dL   Alkaline Phosphatase 102 39 - 117 IU/L   AST 26 0 - 40 IU/L   ALT 40 0 - 44 IU/L  Lipid panel  Result Value Ref Range   Cholesterol, Total 184 100 - 199 mg/dL   Triglycerides 585 (HH) 0 - 149 mg/dL   HDL 33 (L) >39 mg/dL   VLDL Cholesterol Cal Comment 5 - 40 mg/dL   LDL Calculated Comment 0 - 99 mg/dL   Chol/HDL Ratio 5.6 (H) 0.0 - 5.0 ratio  PSA  Result Value Ref Range   Prostate Specific Ag, Serum 2.6 0.0 - 4.0 ng/mL  TSH  Result Value Ref Range   TSH 2.110 0.450 - 4.500 uIU/mL  Urinalysis, Routine w reflex microscopic  Result Value Ref Range   Specific Gravity, UA 1.020 1.005 - 1.030   pH, UA 6.0 5.0 - 7.5   Color, UA Yellow Yellow   Appearance Ur Clear Clear   Leukocytes, UA Negative Negative   Protein, UA Trace (A) Negative/Trace   Glucose, UA Negative Negative   Ketones, UA Negative Negative   RBC, UA Trace (A) Negative   Bilirubin, UA Negative Negative   Urobilinogen, Ur 0.2 0.2 - 1.0 mg/dL   Nitrite, UA Negative Negative    Microscopic Examination See below:       Assessment & Plan:   Problem List Items Addressed This Visit      Cardiovascular and Mediastinum   Essential hypertension    With poor control discussed medications will increase amlodipine from 5 mg to 10 mg patient will start checking his blood pressure more regularly and if elevated still may need to add hydrochlorothiazide. Patient will recheck in 1 month or so.      Relevant Medications   amLODipine (NORVASC) 10 MG tablet   Other Relevant Orders   Basic metabolic panel     Other   Hyperlipidemia    Will check lipid panel and will do fasting as triglycerides were elevated last time.       Relevant Medications   amLODipine (NORVASC) 10 MG tablet   Other Relevant Orders   LP+ALT+AST Piccolo, Waived   Elevated glucose    Discussed blood work showing some elevated glucose will check hemoglobin A1c. Patient states his last glucose was nonfasting.       Relevant Orders   Bayer DCA Hb A1c Waived       Telemedicine using audio/video telecommunications for a synchronous communication visit. Today's visit due to COVID-19 isolation precautions I connected with and verified that I am speaking with the correct person using two identifiers.   I discussed the limitations, risks, security and privacy concerns of performing an evaluation and management service by telecommunication and the availability of in person appointments. I also discussed with the patient that there may be a patient responsible charge related to this service. The patient expressed understanding and agreed to proceed. The patient's location is home. I am at home.   I discussed the assessment and treatment plan with the patient. The patient was provided an opportunity to ask questions and all were answered. The patient agreed with the plan and demonstrated an understanding of the instructions.   The patient was advised to call back or seek an in-person evaluation if the  symptoms worsen or if the condition fails to improve as anticipated.   I provided 21+ minutes of time during this encounter. Follow up plan: Return in about 4 weeks (around 10/05/2018) for BP and lab f/u.

## 2018-09-09 ENCOUNTER — Encounter: Payer: Self-pay | Admitting: Family Medicine

## 2018-09-10 ENCOUNTER — Other Ambulatory Visit: Payer: Self-pay | Admitting: Family Medicine

## 2018-09-10 MED ORDER — CEPHALEXIN 500 MG PO CAPS: 500.0000 mg | ORAL_CAPSULE | Freq: Four times a day (QID) | ORAL | 0 refills | Status: DC

## 2018-09-28 ENCOUNTER — Other Ambulatory Visit: Payer: Self-pay | Admitting: Family Medicine

## 2018-09-28 DIAGNOSIS — E7800 Pure hypercholesterolemia, unspecified: Secondary | ICD-10-CM

## 2018-09-29 NOTE — Telephone Encounter (Signed)
Routing to provider. Patient's last appointment was 09/07/18.

## 2018-09-29 NOTE — Telephone Encounter (Signed)
Requested medication (s) are due for refill today: yes  Requested medication (s) are on the active medication list: yes  Last refill:  08/04/2018  Future visit scheduled: no  Notes to clinic: requesting 1 year supply   Requested Prescriptions  Pending Prescriptions Disp Refills   colesevelam ([place]) 625 MG tablet [Pharmacy Med Name: COLESEVELAM  625MG   TAB] 540 tablet 3    Sig: TAKE 3 TABLETS BY MOUTH  TWICE DAILY     Cardiovascular:  Antilipid - Bile Acid Sequestrants Failed - 09/28/2018  9:48 PM      Failed - HDL in normal range and within 360 days    HDL  Date Value Ref Range Status  03/02/2018 33 (L) >39 mg/dL Final         Failed - Triglycerides in normal range and within 360 days    Triglycerides  Date Value Ref Range Status  03/02/2018 585 (HH) 0 - 149 mg/dL Final   Triglycerides Piccolo,Waived  Date Value Ref Range Status  08/26/2017 412 (H) <150 mg/dL Final    Comment:                            Normal                   <150                         Borderline High     150 - 199                         High                200 - 499                         Very High                >499          Passed - Total Cholesterol in normal range and within 360 days    Cholesterol, Total  Date Value Ref Range Status  03/02/2018 184 100 - 199 mg/dL Final   Cholesterol Piccolo, Waived  Date Value Ref Range Status  08/26/2017 165 <200 mg/dL Final    Comment:                            Desirable                <200                         Borderline High      200- 239                         High                     >239          Passed - LDL in normal range and within 360 days    LDL Calculated  Date Value Ref Range Status  03/02/2018 Comment 0 - 99 mg/dL Final    Comment:    Triglyceride result indicated is too high for an accurate LDL cholesterol estimation.          Passed -  Valid encounter within last 12 months    Recent Outpatient Visits          3  weeks ago Essential hypertension   [place], Jeannette How, MD   7 months ago Essential hypertension   [place] [facility], Jeannette How, MD   1 year ago Essential hypertension   [place], Jeannette How, MD   1 year ago Essential hypertension   [place], Jeannette How, MD   2 years ago Essential hypertension   [facility], Jeannette How, MD

## 2018-11-06 ENCOUNTER — Other Ambulatory Visit: Payer: Self-pay

## 2018-11-06 ENCOUNTER — Other Ambulatory Visit: Payer: 59

## 2018-11-06 DIAGNOSIS — I10 Essential (primary) hypertension: Secondary | ICD-10-CM

## 2018-11-06 DIAGNOSIS — E7800 Pure hypercholesterolemia, unspecified: Secondary | ICD-10-CM

## 2018-11-06 DIAGNOSIS — R7309 Other abnormal glucose: Secondary | ICD-10-CM

## 2018-11-07 LAB — BASIC METABOLIC PANEL
BUN/Creatinine Ratio: 21 (ref 10–24)
BUN: 22 mg/dL (ref 8–27)
CO2: 22 mmol/L (ref 20–29)
Calcium: 9.7 mg/dL (ref 8.6–10.2)
Chloride: 103 mmol/L (ref 96–106)
Creatinine, Ser: 1.04 mg/dL (ref 0.76–1.27)
GFR calc Af Amer: 89 mL/min/{1.73_m2} (ref >59)
GFR calc non Af Amer: 77 mL/min/{1.73_m2} (ref >59)
Glucose: 155 mg/dL — ABNORMAL HIGH (ref 65–99)
Potassium: 4 mmol/L (ref 3.5–5.2)
Sodium: 139 mmol/L (ref 134–144)

## 2018-11-07 LAB — BAYER DCA HB A1C WAIVED: HB A1C (BAYER DCA - WAIVED): 6.9 % (ref <7.0)

## 2018-11-10 ENCOUNTER — Encounter: Payer: Self-pay | Admitting: Family Medicine

## 2018-11-10 ENCOUNTER — Other Ambulatory Visit: Payer: Self-pay | Admitting: Family Medicine

## 2018-11-10 LAB — LIPID PANEL W/O CHOL/HDL RATIO
Cholesterol, Total: 183 mg/dL (ref 100–199)
HDL: 35 mg/dL — ABNORMAL LOW (ref >39)
LDL Chol Calc (NIH): 83 mg/dL (ref 0–99)
Triglycerides: 404 mg/dL — ABNORMAL HIGH (ref 0–149)
VLDL Cholesterol Cal: 65 mg/dL — ABNORMAL HIGH (ref 5–40)

## 2018-11-10 MED ORDER — METFORMIN HCL 500 MG PO TABS: 500.0000 mg | ORAL_TABLET | Freq: Every day | ORAL | 2 refills | Status: DC

## 2018-11-10 NOTE — Progress Notes (Signed)
Phone call Discussed with patient new development of diabetes with hemoglobin A1c now at 6.9. Patient already on ACE inhibitor and statin. Will start Metformin 500 at supper. Discussed diabetes. Patient recheck 3 months

## 2018-11-11 ENCOUNTER — Encounter: Payer: Self-pay | Admitting: Family Medicine

## 2019-02-11 ENCOUNTER — Other Ambulatory Visit: Payer: Self-pay

## 2019-02-11 ENCOUNTER — Encounter: Payer: Self-pay | Admitting: Family Medicine

## 2019-02-11 ENCOUNTER — Ambulatory Visit (INDEPENDENT_AMBULATORY_CARE_PROVIDER_SITE_OTHER): Payer: 59 | Admitting: Family Medicine

## 2019-02-11 VITALS — BP 138/74 | HR 80 | Temp 97.9°F | Ht 72.0 in | Wt 184.0 lb

## 2019-02-11 DIAGNOSIS — I739 Peripheral vascular disease, unspecified: Secondary | ICD-10-CM | POA: Diagnosis not present

## 2019-02-11 DIAGNOSIS — I700 Atherosclerosis of aorta: Secondary | ICD-10-CM

## 2019-02-11 DIAGNOSIS — I2510 Atherosclerotic heart disease of native coronary artery without angina pectoris: Secondary | ICD-10-CM

## 2019-02-11 DIAGNOSIS — E119 Type 2 diabetes mellitus without complications: Secondary | ICD-10-CM

## 2019-02-11 DIAGNOSIS — I10 Essential (primary) hypertension: Secondary | ICD-10-CM | POA: Diagnosis not present

## 2019-02-11 DIAGNOSIS — J439 Emphysema, unspecified: Secondary | ICD-10-CM

## 2019-02-11 DIAGNOSIS — E7800 Pure hypercholesterolemia, unspecified: Secondary | ICD-10-CM | POA: Diagnosis not present

## 2019-02-11 NOTE — Progress Notes (Signed)
BP 138/74   Pulse 80   Temp 97.9 F (36.6 C) (Oral)   Ht 6' (1.829 m)   Wt 184 lb (83.5 kg)   SpO2 99%   BMI 24.95 kg/m    Subjective:    Patient ID: Maxwell Young, male    DOB: [birth year], 64 y.o.   MRN: [id number]  HPI: ROHUN HENSCH is a 64 y.o. male  Chief Complaint  Patient presents with  . Hypertension  . Hyperlipidemia  . Hyperglycemia   Patient presenting today for 6 month f/u chronic conditions.   Not checking home BSs, home BPs have been running around 130s/70s-80s. Taking medications faithfully without side effects. Trying to eat well and stay active. Denies CP, SOB, HAs, dizziness. Myalgias, claudication, low blood sugars.  The 10-year ASCVD risk score Mikey Bussing DC Brooke Bonito., et al., 2013) is: 31.1%   Values used to calculate the score:     Age: 48 years     Sex: Male     Is Non-Hispanic African American: No     Diabetic: Yes     Tobacco smoker: Yes     Systolic Blood Pressure: 0000000 mmHg     Is BP treated: Yes     HDL Cholesterol: 35 mg/dL     Total Cholesterol: 138 mg/dL   Relevant past medical, surgical, family and social history reviewed and updated as indicated. Interim medical history since our last visit reviewed. Allergies and medications reviewed and updated.  Review of Systems  Per HPI unless specifically indicated above     Objective:    BP 138/74   Pulse 80   Temp 97.9 F (36.6 C) (Oral)   Ht 6' (1.829 m)   Wt 184 lb (83.5 kg)   SpO2 99%   BMI 24.95 kg/m   Wt Readings from Last 3 Encounters:  02/11/19 184 lb (83.5 kg)  07/13/18 194 lb (88 kg)  03/02/18 194 lb 9.6 oz (88.3 kg)    Physical Exam Vitals and nursing note reviewed.  Constitutional:      Appearance: Normal appearance.  HENT:     Head: Atraumatic.  Eyes:     Extraocular Movements: Extraocular movements intact.     Conjunctiva/sclera: Conjunctivae normal.  Cardiovascular:     Rate and Rhythm: Normal rate and regular rhythm.  Pulmonary:     Effort: Pulmonary effort  is normal.     Breath sounds: Normal breath sounds.  Musculoskeletal:        General: Normal range of motion.     Cervical back: Normal range of motion and neck supple.  Skin:    General: Skin is warm and dry.  Neurological:     General: No focal deficit present.     Mental Status: He is oriented to person, place, and time.  Psychiatric:        Mood and Affect: Mood normal.        Thought Content: Thought content normal.        Judgment: Judgment normal.     Results for orders placed or performed in visit on 02/11/19  Comprehensive metabolic panel  Result Value Ref Range   Glucose 162 (H) 65 - 99 mg/dL   BUN 12 8 - 27 mg/dL   Creatinine, Ser 0.91 0.76 - 1.27 mg/dL   GFR calc non Af Amer 91 >59 mL/min/1.73   GFR calc Af Amer 105 >59 mL/min/1.73   BUN/Creatinine Ratio 13 10 - 24   Sodium 141 134 - 144 mmol/L  Potassium 4.1 3.5 - 5.2 mmol/L   Chloride 103 96 - 106 mmol/L   CO2 23 20 - 29 mmol/L   Calcium 9.6 8.6 - 10.2 mg/dL   Total Protein 6.9 6.0 - 8.5 g/dL   Albumin 4.4 3.8 - 4.8 g/dL   Globulin, Total 2.5 1.5 - 4.5 g/dL   Albumin/Globulin Ratio 1.8 1.2 - 2.2   Bilirubin Total 0.3 0.0 - 1.2 mg/dL   Alkaline Phosphatase 85 39 - 117 IU/L   AST 38 0 - 40 IU/L   ALT 45 (H) 0 - 44 IU/L  Lipid Panel w/o Chol/HDL Ratio  Result Value Ref Range   Cholesterol, Total 138 100 - 199 mg/dL   Triglycerides 393 (H) 0 - 149 mg/dL   HDL 35 (L) >39 mg/dL   VLDL Cholesterol Cal 58 (H) 5 - 40 mg/dL   LDL Chol Calc (NIH) 45 0 - 99 mg/dL  HgB A1c  Result Value Ref Range   Hgb A1c MFr Bld 6.2 (H) 4.8 - 5.6 %   Est. average glucose Bld gHb Est-mCnc 131 mg/dL      Assessment & Plan:   Problem List Items Addressed This Visit      Cardiovascular and Mediastinum   Essential hypertension - Primary    BPs stable and under good control, continue current regimen      Relevant Orders   Comprehensive metabolic panel (Completed)   CAD (coronary artery disease)   Aortic atherosclerosis  (HCC)   PAD (peripheral artery disease) (HCC)    Recheck lipids, adjust as needed. Continue working on lifestyle habits and current medications        Respiratory   COPD (chronic obstructive pulmonary disease) with emphysema (HCC)    Stable off inhalers. No exacerbations or sxs        Endocrine   Controlled type 2 diabetes mellitus without complication, without long-term current use of insulin (HCC)    Recheck A1C, adjust as needed. Continue current regimen and lifestyle modifications      Relevant Orders   HgB A1c (Completed)     Other   Hyperlipidemia    Recheck lipids, adjust as needed. Continue working on lifestyle habits and current medications      Relevant Orders   Lipid Panel w/o Chol/HDL Ratio (Completed)       Follow up plan: Return in about 6 months (around 08/11/2019) for CPE.

## 2019-02-12 LAB — COMPREHENSIVE METABOLIC PANEL
ALT: 45 IU/L — ABNORMAL HIGH (ref 0–44)
AST: 38 IU/L (ref 0–40)
Albumin/Globulin Ratio: 1.8 (ref 1.2–2.2)
Albumin: 4.4 g/dL (ref 3.8–4.8)
Alkaline Phosphatase: 85 IU/L (ref 39–117)
BUN/Creatinine Ratio: 13 (ref 10–24)
BUN: 12 mg/dL (ref 8–27)
Bilirubin Total: 0.3 mg/dL (ref 0.0–1.2)
CO2: 23 mmol/L (ref 20–29)
Calcium: 9.6 mg/dL (ref 8.6–10.2)
Chloride: 103 mmol/L (ref 96–106)
Creatinine, Ser: 0.91 mg/dL (ref 0.76–1.27)
GFR calc Af Amer: 105 mL/min/{1.73_m2} (ref >59)
GFR calc non Af Amer: 91 mL/min/{1.73_m2} (ref >59)
Globulin, Total: 2.5 g/dL (ref 1.5–4.5)
Glucose: 162 mg/dL — ABNORMAL HIGH (ref 65–99)
Potassium: 4.1 mmol/L (ref 3.5–5.2)
Sodium: 141 mmol/L (ref 134–144)
Total Protein: 6.9 g/dL (ref 6.0–8.5)

## 2019-02-12 LAB — HEMOGLOBIN A1C
Est. average glucose Bld gHb Est-mCnc: 131 mg/dL
Hgb A1c MFr Bld: 6.2 % — ABNORMAL HIGH (ref 4.8–5.6)

## 2019-02-12 LAB — LIPID PANEL W/O CHOL/HDL RATIO
Cholesterol, Total: 138 mg/dL (ref 100–199)
HDL: 35 mg/dL — ABNORMAL LOW (ref >39)
LDL Chol Calc (NIH): 45 mg/dL (ref 0–99)
Triglycerides: 393 mg/dL — ABNORMAL HIGH (ref 0–149)
VLDL Cholesterol Cal: 58 mg/dL — ABNORMAL HIGH (ref 5–40)

## 2019-02-18 NOTE — Assessment & Plan Note (Signed)
Recheck A1C, adjust as needed. Continue current regimen and lifestyle modifications 

## 2019-02-18 NOTE — Assessment & Plan Note (Signed)
BPs stable and under good control, continue current regimen 

## 2019-02-18 NOTE — Assessment & Plan Note (Signed)
Recheck lipids, adjust as needed. Continue working on lifestyle habits and current medications

## 2019-02-18 NOTE — Assessment & Plan Note (Signed)
Stable off inhalers. No exacerbations or sxs

## 2019-03-18 ENCOUNTER — Other Ambulatory Visit: Payer: Self-pay

## 2019-03-18 DIAGNOSIS — E7800 Pure hypercholesterolemia, unspecified: Secondary | ICD-10-CM

## 2019-03-18 DIAGNOSIS — I10 Essential (primary) hypertension: Secondary | ICD-10-CM

## 2019-03-18 MED ORDER — METOPROLOL SUCCINATE ER 100 MG PO TB24: 100.0000 mg | ORAL_TABLET | Freq: Every day | ORAL | 1 refills | Status: DC

## 2019-03-18 MED ORDER — ROSUVASTATIN CALCIUM 40 MG PO TABS: 40.0000 mg | ORAL_TABLET | Freq: Every day | ORAL | 1 refills | Status: DC

## 2019-03-18 MED ORDER — BENAZEPRIL HCL 40 MG PO TABS: 40.0000 mg | ORAL_TABLET | Freq: Every day | ORAL | 1 refills | Status: DC

## 2019-03-18 NOTE — Telephone Encounter (Signed)
Refill request for benazepril, rosuvastatin, and metoprolol.  LOV: 02/11/2019 Next Appt: none

## 2019-04-16 ENCOUNTER — Other Ambulatory Visit: Payer: Self-pay

## 2019-04-16 DIAGNOSIS — I10 Essential (primary) hypertension: Secondary | ICD-10-CM

## 2019-04-16 MED ORDER — AMLODIPINE BESYLATE 10 MG PO TABS: 10.0000 mg | ORAL_TABLET | Freq: Every day | ORAL | 1 refills | Status: DC

## 2019-04-16 NOTE — Telephone Encounter (Signed)
Refill request for Amlodipine LOV: 02/11/2019 Next Appt: none

## 2019-07-09 ENCOUNTER — Telehealth: Payer: Self-pay | Admitting: *Deleted

## 2019-07-09 NOTE — Telephone Encounter (Signed)
(  07/09/2019) Unable to leave message for pt to notify them that it is time to schedule annual low dose lung cancer screening CT scan. Will call back to verify information prior to the scan being scheduled [facility]

## 2019-07-16 ENCOUNTER — Telehealth: Payer: Self-pay | Admitting: *Deleted

## 2019-07-16 NOTE — Telephone Encounter (Signed)
Attempted to reach this patient to notify that his Low Dose Lung Screening Ct is due now. He does not have a VM set up. I couldn't leave a message.

## 2019-08-20 ENCOUNTER — Encounter: Payer: Self-pay | Admitting: Nurse Practitioner

## 2019-08-20 ENCOUNTER — Ambulatory Visit (INDEPENDENT_AMBULATORY_CARE_PROVIDER_SITE_OTHER): Payer: 59 | Admitting: Nurse Practitioner

## 2019-08-20 ENCOUNTER — Other Ambulatory Visit: Payer: Self-pay

## 2019-08-20 ENCOUNTER — Telehealth: Payer: Self-pay | Admitting: *Deleted

## 2019-08-20 VITALS — BP 145/88 | HR 75 | Temp 97.8°F | Ht 70.47 in | Wt 185.4 lb

## 2019-08-20 DIAGNOSIS — Z122 Encounter for screening for malignant neoplasm of respiratory organs: Secondary | ICD-10-CM

## 2019-08-20 DIAGNOSIS — Z87891 Personal history of nicotine dependence: Secondary | ICD-10-CM

## 2019-08-20 DIAGNOSIS — J439 Emphysema, unspecified: Secondary | ICD-10-CM | POA: Diagnosis not present

## 2019-08-20 DIAGNOSIS — Z125 Encounter for screening for malignant neoplasm of prostate: Secondary | ICD-10-CM

## 2019-08-20 DIAGNOSIS — E119 Type 2 diabetes mellitus without complications: Secondary | ICD-10-CM | POA: Diagnosis not present

## 2019-08-20 DIAGNOSIS — I700 Atherosclerosis of aorta: Secondary | ICD-10-CM

## 2019-08-20 DIAGNOSIS — Z1329 Encounter for screening for other suspected endocrine disorder: Secondary | ICD-10-CM

## 2019-08-20 DIAGNOSIS — I739 Peripheral vascular disease, unspecified: Secondary | ICD-10-CM | POA: Diagnosis not present

## 2019-08-20 DIAGNOSIS — I2510 Atherosclerotic heart disease of native coronary artery without angina pectoris: Secondary | ICD-10-CM

## 2019-08-20 DIAGNOSIS — Z0000 Encounter for general adult medical examination without abnormal findings: Secondary | ICD-10-CM | POA: Diagnosis not present

## 2019-08-20 DIAGNOSIS — F17210 Nicotine dependence, cigarettes, uncomplicated: Secondary | ICD-10-CM

## 2019-08-20 DIAGNOSIS — I10 Essential (primary) hypertension: Secondary | ICD-10-CM

## 2019-08-20 DIAGNOSIS — E7800 Pure hypercholesterolemia, unspecified: Secondary | ICD-10-CM

## 2019-08-20 DIAGNOSIS — K219 Gastro-esophageal reflux disease without esophagitis: Secondary | ICD-10-CM

## 2019-08-20 MED ORDER — BENAZEPRIL HCL 40 MG PO TABS: 40.0000 mg | ORAL_TABLET | Freq: Every day | ORAL | 1 refills | Status: DC

## 2019-08-20 MED ORDER — METFORMIN HCL 500 MG PO TABS: 500.0000 mg | ORAL_TABLET | Freq: Every day | ORAL | 1 refills | Status: DC

## 2019-08-20 MED ORDER — AMLODIPINE BESYLATE 10 MG PO TABS: 10.0000 mg | ORAL_TABLET | Freq: Every day | ORAL | 1 refills | Status: DC

## 2019-08-20 MED ORDER — ROSUVASTATIN CALCIUM 40 MG PO TABS: 40.0000 mg | ORAL_TABLET | Freq: Every day | ORAL | 1 refills | Status: DC

## 2019-08-20 MED ORDER — METOPROLOL SUCCINATE ER 100 MG PO TB24: 100.0000 mg | ORAL_TABLET | Freq: Every day | ORAL | 1 refills | Status: DC

## 2019-08-20 MED ORDER — COLESEVELAM HCL 625 MG PO TABS: 1875.0000 mg | ORAL_TABLET | Freq: Two times a day (BID) | ORAL | 3 refills | Status: DC

## 2019-08-20 NOTE — Assessment & Plan Note (Addendum)
Chronic, ongoing.  Slightly elevated and above goal in office today, patient reports this is normal for him when he goes to the doctor.  Continue to monitor BP at home and return to clinic if >140/90.  Continue amlodipine and benazepril for now.  CMP checked today. Continue working on other lifestyle changes.   Follow up 6 months or sooner of anything changes.

## 2019-08-20 NOTE — Assessment & Plan Note (Addendum)
Chronic, ongoing.  On high-intensity statin, rosuvastatin 40 mg daily - continue this and refills given.  Cholesterol checked today.  Not interested in quitting smoking to minimize risk factors for further disease at this time, will be available to patient if he changes mind in future.  Continue working on other lifestyle changes.   Follow up 6 months or sooner of anything changes.

## 2019-08-20 NOTE — Assessment & Plan Note (Signed)
Chronic, stable on omeprazole 20 mg daily.  Continue for now.  Discussed side effects of long-term use.  May consider trial of discontinuance and only prn use in future.  May also consider referral to GI for upper EGD if symptoms persist or worsen.

## 2019-08-20 NOTE — Assessment & Plan Note (Signed)
Chronic, stable without medication.  Discussed great possibility of needing medication in future and Spirometry if symptoms of shortness of breath or cough develop.

## 2019-08-20 NOTE — Assessment & Plan Note (Signed)
Chronic, stable.  Continue high-intensity statin, rosuvastatin 40 mg daily, refills given today.  Cholesterol checked today.  Not interested in quitting smoking to minimize risk factors for further disease at this time, will be available to patient if he changes mind in future.  Continue working on other lifestyle changes.   Follow up 6 months or sooner of anything changes.

## 2019-08-20 NOTE — Assessment & Plan Note (Addendum)
Chronic, stable.  Initial finding on CT scan Nov. 2018.  Continue high-intensity statin, rosuvastatin 40 mg daily, refills given.  Cholesterol checked today.  Not interested in quitting smoking to minimize risk factors for further disease at this time, will be available to patient if he changes mind in future.  Continue working on other lifestyle changes.   Follow up 6 months or sooner of anything changes.

## 2019-08-20 NOTE — Assessment & Plan Note (Signed)
Chronic, ongoing.  On high-intensity statin, rosuvastatin 40 mg daily - continue this and refills given.  Cholesterol checked today.  Not interested in quitting smoking to minimize risk factors for further disease at this time, will be available to patient if he changes mind in future.  Continue working on other lifestyle changes.   Follow up 6 months or sooner of anything changes.

## 2019-08-20 NOTE — Patient Instructions (Signed)
Nice meeting you today, Maxwell Young.   We will let you know about your lab results on Monday.  Your refills should be at your pharmacy.  I will reach out to Shawn to contact you again to schedule your low dose CT scan.    Please let us know if you need anything.  Preventive Care 50-62 Years Old, Male Preventive care refers to lifestyle choices and visits with your health care provider that can promote health and wellness. This includes:  A yearly physical exam. This is also called an annual well check.  Regular dental and eye exams.  Immunizations.  Screening for certain conditions.  Healthy lifestyle choices, such as eating a healthy diet, getting regular exercise, not using drugs or products that contain nicotine and tobacco, and limiting alcohol use. What can I expect for my preventive care visit? Physical exam Your health care provider will check:  Height and weight. These may be used to calculate body mass index (BMI), which is a measurement that tells if you are at a healthy weight.  Heart rate and blood pressure.  Your skin for abnormal spots. Counseling Your health care provider may ask you questions about:  Alcohol, tobacco, and drug use.  Emotional well-being.  Home and relationship well-being.  Sexual activity.  Eating habits.  Work and work Statistician. What immunizations do I need?  Influenza (flu) vaccine  This is recommended every year. Tetanus, diphtheria, and pertussis (Tdap) vaccine  You may need a Td booster every 10 years. Varicella (chickenpox) vaccine  You may need this vaccine if you have not already been vaccinated. Zoster (shingles) vaccine  You may need this after age 62. Measles, mumps, and rubella (MMR) vaccine  You may need at least one dose of MMR if you were born in 1957 or later. You may also need a second dose. Pneumococcal conjugate (PCV13) vaccine  You may need this if you have certain conditions and were not previously  vaccinated. Pneumococcal polysaccharide (PPSV23) vaccine  You may need one or two doses if you smoke cigarettes or if you have certain conditions. Meningococcal conjugate (MenACWY) vaccine  You may need this if you have certain conditions. Hepatitis A vaccine  You may need this if you have certain conditions or if you travel or work in places where you may be exposed to hepatitis A. Hepatitis B vaccine  You may need this if you have certain conditions or if you travel or work in places where you may be exposed to hepatitis B. Haemophilus influenzae type b (Hib) vaccine  You may need this if you have certain risk factors. Human papillomavirus (HPV) vaccine  If recommended by your health care provider, you may need three doses over 6 months. You may receive vaccines as individual doses or as more than one vaccine together in one shot (combination vaccines). Talk with your health care provider about the risks and benefits of combination vaccines. What tests do I need? Blood tests  Lipid and cholesterol levels. These may be checked every 5 years, or more frequently if you are over 35 years old.  Hepatitis C test.  Hepatitis B test. Screening  Lung cancer screening. You may have this screening every year starting at age 62 if you have a 30-pack-year history of smoking and currently smoke or have quit within the past 15 years.  Prostate cancer screening. Recommendations will vary depending on your family history and other risks.  Colorectal cancer screening. All adults should have this screening starting at age  62 and continuing until age 32. Your health care provider may recommend screening at age 62 if you are at increased risk. You will have tests every 1-10 years, depending on your results and the type of screening test.  Diabetes screening. This is done by checking your blood sugar (glucose) after you have not eaten for a while (fasting). You may have this done every 1-3  years.  Sexually transmitted disease (STD) testing. Follow these instructions at home: Eating and drinking  Eat a diet that includes fresh fruits and vegetables, whole grains, lean protein, and low-fat dairy products.  Take vitamin and mineral supplements as recommended by your health care provider.  Do not drink alcohol if your health care provider tells you not to drink.  If you drink alcohol: ? Limit how much you have to 0-2 drinks a day. ? Be aware of how much alcohol is in your drink. In the [place] one drink equals one 12 oz bottle of beer (355 mL), one 5 oz glass of wine (148 mL), or one 1 oz glass of hard liquor (44 mL). Lifestyle  Take daily care of your teeth and gums.  Stay active. Exercise for at least 30 minutes on 5 or more days each week.  Do not use any products that contain nicotine or tobacco, such as cigarettes, e-cigarettes, and chewing tobacco. If you need help quitting, ask your health care provider.  If you are sexually active, practice safe sex. Use a condom or other form of protection to prevent STIs (sexually transmitted infections).  Talk with your health care provider about taking a low-dose aspirin every day starting at age 62. What's next?  Go to your health care provider once a year for a well check visit.  Ask your health care provider how often you should have your eyes and teeth checked.  Stay up to date on all vaccines. This information is not intended to replace advice given to you by your health care provider. Make sure you discuss any questions you have with your health care provider. Document Revised: 01/01/2018 Document Reviewed: 01/01/2018 Elsevier Patient Education  2020 [employer].

## 2019-08-20 NOTE — Assessment & Plan Note (Signed)
Discussed smoking cessation - patient not interested at this time.  Will continue to be available to patient if he desires to stop smoking in future.  Collaborated with lung cancer screening folks to get yearly low-dose CT scheduled.

## 2019-08-20 NOTE — Assessment & Plan Note (Addendum)
Chronic, stable.  Continue metformin 500 mg once daily for now.  HbA1c checked today, will adjust metformin as needed to get to goal.  Continue lifestyle modifications to help keep blood sugar down.  Follow up in 6 months.

## 2019-08-20 NOTE — Progress Notes (Signed)
BP (!) 145/88 (BP Location: Left Arm, Patient Position: Sitting, Cuff Size: Normal)   Pulse 75   Temp 97.8 F (36.6 C) (Oral)   Ht 5' 10.47" (1.79 m)   Wt 185 lb 6.4 oz (84.1 kg)   SpO2 98%   BMI 26.25 kg/m    Subjective:    Patient ID: Maxwell Young, male    DOB: 03/21/1955, 64 y.o.   MRN: [id number]  HPI: Maxwell Young is a 64 y.o. male presenting on 08/20/2019 for comprehensive medical examination. Current medical complaints include:  DIABETES Taking metformin and colesevelam as prescribed Hypoglycemic episodes:no Polydipsia/polyuria: no Visual disturbance: no Chest pain: no Paresthesias: no Glucose Monitoring: no Blood Pressure Monitoring: weekly 2-3 times per week Retinal Examination: Up to Date Foot Exam: Up to Date Diabetic Education: Completed Pneumovax: Up to Date Influenza: Up to Date Aspirin: yes  HYPERTENSION / HYPERLIPIDEMIA Reports BP is always elevated in doctor's office.  Runs good at home.  Taking amlodipine 10mg , benazepril 40 mg, and metoprolol for blood pressure/previous heart attack.  Taking rosuvastatin 50 mg and colesevelam 625 mg bid for high cholesterol.  Satisfied with current treatment? yes Duration of hypertension: chronic BP monitoring frequency: couple of times per week BP range: 110/65-70; 130/80 BP medication side effects: no Past BP meds: amlodipine, benazepril, metoprolol Duration of hyperlipidemia: chronic Cholesterol medication side effects: no Cholesterol supplements: none Past cholesterol medications: rosuvastatin, colesevelam Medication compliance: excellent compliance Aspirin: yes Recent stressors: no Recurrent headaches: no Visual changes: no Palpitations: no Dyspnea: no Chest pain: no Lower extremity edema: no Dizzy/lightheaded: no  GERD Taking omeprazole daily. GERD control status: controlled  Satisfied with current treatment? yes Heartburn frequency: never Medication side effects: no  Medication  compliance: better Previous GERD medications: nexium Antacid use frequency:  never Alleviatiating factors:  Taking medication daily  Aggravating factors: certain foods Dysphagia: no Odynophagia:  no Hematemesis: no Blood in stool: no EGD: no   CORONARY ARTERY DISEASE With significant history of CABG in 2008 with 3 stents placed as well as aortic atherosclerosis.  Appears to be stable and is taking medication but continues to smoke and not interested in quitting at this time. Angina frequency: none Chest pain: no Edema: no Orthopnea: no Paroxysmal nocturnal dyspnea: no Dyspnea on exertion: no Pneumovax:  Up to Date Aspirin: yes  He currently lives with: wife Interim Problems from his last visit: no  Depression Screen done today and results listed below:  Depression screen [facility] 2/9 08/20/2019 03/02/2018 08/26/2017 02/24/2017 07/01/2016  Decreased Interest 0 0 0 0 0  Down, Depressed, Hopeless 0 0 0 0 0  PHQ - 2 Score 0 0 0 0 0  Altered sleeping 0 0 - - -  Tired, decreased energy 0 0 - - -  Change in appetite 0 0 - - -  Feeling bad or failure about yourself  0 0 - - -  Trouble concentrating 0 0 - - -  Moving slowly or fidgety/restless 0 0 - - -  Suicidal thoughts 0 - - - -  PHQ-9 Score 0 0 - - -    The patient does not have a history of falls. I did not complete a risk assessment for falls. A plan of care for falls was not documented.   Past Medical History:  Past Medical History:  Diagnosis Date  . CAD (coronary artery disease)   . Colon polyps 2011  . Diverticula of colon 2011  . Elevated fasting glucose   . GERD (  gastroesophageal reflux disease)   . History of hemorrhoids 2011   internal  . Hyperlipidemia   . Hypertension   . Personal history of tobacco use, presenting hazards to health 07/27/2015  . Wears dentures    full upper and lower    Surgical History:  Past Surgical History:  Procedure Laterality Date  . CERVICAL FUSION  06/09/003  . COLONOSCOPY    .  COLONOSCOPY WITH PROPOFOL N/A 07/24/2015   Procedure: COLONOSCOPY WITH PROPOFOL;  Surgeon: Lucilla Lame, MD;  Location: [facility];  Service: Endoscopy;  Laterality: N/A;  . CORONARY ARTERY BYPASS GRAFT  2008   [facility] - 3 vessel  . POLYPECTOMY  07/24/2015   Procedure: POLYPECTOMY;  Surgeon: Lucilla Lame, MD;  Location: [facility];  Service: Endoscopy;;    Medications:  Current Outpatient Medications on File Prior to Visit  Medication Sig  . aspirin EC 325 MG tablet Take 325 mg by mouth daily.  Marland Kitchen KRILL OIL PO Take by mouth.  . Multiple Vitamins-Minerals (MULTIVITAMIN ADULT PO) Take by mouth daily.  Marland Kitchen omeprazole (PRILOSEC) 20 MG capsule Take 20 mg by mouth daily.   No current facility-administered medications on file prior to visit.    Allergies:  No Known Allergies  Social History:  Social History   Socioeconomic History  . Marital status: Married    Spouse name: Not on file  . Number of children: Not on file  . Years of education: Not on file  . Highest education level: Not on file  Occupational History  . Not on file  Tobacco Use  . Smoking status: Current Every Day Smoker    Packs/day: 1.00    Years: 40.00    Pack years: 40.00    Types: Cigarettes  . Smokeless tobacco: Never Used  Substance and Sexual Activity  . Alcohol use: No  . Drug use: No  . Sexual activity: Not on file  Other Topics Concern  . Not on file  Social History Narrative  . Not on file   Social Determinants of Health   Financial Resource Strain:   . Difficulty of Paying Living Expenses:   Food Insecurity:   . Worried About Charity fundraiser in the Last Year:   . Arboriculturist in the Last Year:   Transportation Needs:   . Film/video editor (Medical):   Marland Kitchen Lack of Transportation (Non-Medical):   Physical Activity:   . Days of Exercise per Week:   . Minutes of Exercise per Session:   Stress:   . Feeling of Stress :   Social Connections:   . Frequency of Communication  with Friends and Family:   . Frequency of Social Gatherings with Friends and Family:   . Attends Religious Services:   . Active Member of Clubs or Organizations:   . Attends Archivist Meetings:   Marland Kitchen Marital Status:   Intimate Partner Violence:   . Fear of Current or Ex-Partner:   . Emotionally Abused:   Marland Kitchen Physically Abused:   . Sexually Abused:    Social History   Tobacco Use  Smoking Status Current Every Day Smoker  . Packs/day: 1.00  . Years: 40.00  . Pack years: 40.00  . Types: Cigarettes  Smokeless Tobacco Never Used   Social History   Substance and Sexual Activity  Alcohol Use No    Family History:  Family History  Problem Relation Age of Onset  . Hypertension Mother   . Heart disease Mother  CAD  . Cancer Father        colon, prostate  . Stroke Father   . Parkinson's disease Father     Past medical history, surgical history, medications, allergies, family history and social history reviewed with patient today and changes made to appropriate areas of the chart.   Review of Systems  Constitutional: Negative.  Negative for fever, malaise/fatigue and weight loss.  HENT: Negative.  Negative for ear discharge, ear pain, hearing loss, sinus pain and sore throat.   Eyes: Negative.  Negative for blurred vision and double vision.  Respiratory: Negative.  Negative for cough and wheezing.   Cardiovascular: Negative.  Negative for chest pain, palpitations, claudication and leg swelling.  Gastrointestinal: Negative.  Negative for blood in stool, constipation, diarrhea, heartburn, nausea and vomiting.  Genitourinary: Negative.  Negative for dysuria and hematuria.  Musculoskeletal: Negative.   Skin: Negative.   Neurological: Negative.  Negative for dizziness, weakness and headaches.  Psychiatric/Behavioral: Negative.  Negative for depression. The patient is not nervous/anxious and does not have insomnia.    All other ROS negative except what is listed  above and in the HPI.      Objective:    BP (!) 145/88 (BP Location: Left Arm, Patient Position: Sitting, Cuff Size: Normal)   Pulse 75   Temp 97.8 F (36.6 C) (Oral)   Ht 5' 10.47" (1.79 m)   Wt 185 lb 6.4 oz (84.1 kg)   SpO2 98%   BMI 26.25 kg/m   Wt Readings from Last 3 Encounters:  08/20/19 185 lb 6.4 oz (84.1 kg)  02/11/19 184 lb (83.5 kg)  07/13/18 194 lb (88 kg)    Physical Exam Constitutional:      Appearance: Normal appearance. He is normal weight.  HENT:     Head: Normocephalic.     Right Ear: Tympanic membrane, ear canal and external ear normal.     Left Ear: Tympanic membrane, ear canal and external ear normal.     Nose: Nose normal. No congestion.     Mouth/Throat:     Mouth: Mucous membranes are moist.     Pharynx: Oropharynx is clear. No oropharyngeal exudate or posterior oropharyngeal erythema.  Eyes:     General: No scleral icterus.    Extraocular Movements: Extraocular movements intact.     Pupils: Pupils are equal, round, and reactive to light.  Cardiovascular:     Rate and Rhythm: Normal rate and regular rhythm.     Heart sounds: Normal heart sounds. No murmur heard.  No gallop.   Pulmonary:     Effort: Pulmonary effort is normal. No respiratory distress.     Breath sounds: Normal breath sounds. No wheezing or rhonchi.  Abdominal:     General: Abdomen is flat. Bowel sounds are normal. There is no distension.     Palpations: Abdomen is soft.     Tenderness: There is no abdominal tenderness.  Genitourinary:    Comments: Deferred using shared decision making Musculoskeletal:        General: No swelling or tenderness. Normal range of motion.     Cervical back: Normal range of motion and neck supple. No rigidity or tenderness.     Right lower leg: No edema.     Left lower leg: No edema.  Lymphadenopathy:     Cervical: No cervical adenopathy.  Skin:    General: Skin is warm and dry.     Coloration: Skin is not jaundiced.     Findings: No lesion.  Neurological:     General: No focal deficit present.     Mental Status: He is alert and oriented to person, place, and time. Mental status is at baseline.  Psychiatric:        Mood and Affect: Mood normal.        Behavior: Behavior normal.        Thought Content: Thought content normal.        Judgment: Judgment normal.      Assessment & Plan:   Problem List Items Addressed This Visit      Cardiovascular and Mediastinum   Essential hypertension    Chronic, ongoing.  Slightly elevated and above goal in office today, patient reports this is normal for him when he goes to the doctor.  Continue to monitor BP at home and return to clinic if >140/90.  Continue amlodipine and benazepril for now.  CMP checked today. Continue working on other lifestyle changes.   Follow up 6 months or sooner of anything changes.      Relevant Medications   amLODipine (NORVASC) 10 MG tablet   benazepril (LOTENSIN) 40 MG tablet   colesevelam (WELCHOL) 625 MG tablet   metoprolol succinate (TOPROL-XL) 100 MG 24 hr tablet   rosuvastatin (CRESTOR) 40 MG tablet   Other Relevant Orders   Lipid Panel w/o Chol/HDL Ratio   Comprehensive metabolic panel   CAD (coronary artery disease)    Chronic, ongoing.  On high-intensity statin, rosuvastatin 40 mg daily - continue this and refills given.  Cholesterol checked today.  Not interested in quitting smoking to minimize risk factors for further disease at this time, will be available to patient if he changes mind in future.  Continue working on other lifestyle changes.   Follow up 6 months or sooner of anything changes.      Relevant Medications   amLODipine (NORVASC) 10 MG tablet   benazepril (LOTENSIN) 40 MG tablet   colesevelam (WELCHOL) 625 MG tablet   metoprolol succinate (TOPROL-XL) 100 MG 24 hr tablet   rosuvastatin (CRESTOR) 40 MG tablet   Aortic atherosclerosis (HCC)    Chronic, stable.  Initial finding on CT scan Nov. 2018.  Continue high-intensity statin,  rosuvastatin 40 mg daily, refills given.  Cholesterol checked today.  Not interested in quitting smoking to minimize risk factors for further disease at this time, will be available to patient if he changes mind in future.  Continue working on other lifestyle changes.   Follow up 6 months or sooner of anything changes.      Relevant Medications   amLODipine (NORVASC) 10 MG tablet   benazepril (LOTENSIN) 40 MG tablet   colesevelam (WELCHOL) 625 MG tablet   metoprolol succinate (TOPROL-XL) 100 MG 24 hr tablet   rosuvastatin (CRESTOR) 40 MG tablet   Other Relevant Orders   Lipid Panel w/o Chol/HDL Ratio   PAD (peripheral artery disease) (HCC)    Chronic, stable.  Continue high-intensity statin, rosuvastatin 40 mg daily, refills given today.  Cholesterol checked today.  Not interested in quitting smoking to minimize risk factors for further disease at this time, will be available to patient if he changes mind in future.  Continue working on other lifestyle changes.   Follow up 6 months or sooner of anything changes.      Relevant Medications   amLODipine (NORVASC) 10 MG tablet   benazepril (LOTENSIN) 40 MG tablet   colesevelam (WELCHOL) 625 MG tablet   metoprolol succinate (TOPROL-XL) 100 MG 24 hr tablet  rosuvastatin (CRESTOR) 40 MG tablet   Other Relevant Orders   Lipid Panel w/o Chol/HDL Ratio     Respiratory   COPD (chronic obstructive pulmonary disease) with emphysema (HCC)    Chronic, stable without medication.  Discussed great possibility of needing medication in future and Spirometry if symptoms of shortness of breath or cough develop.      Relevant Orders   CBC with Differential/Platelet     Digestive   GERD (gastroesophageal reflux disease)    Chronic, stable on omeprazole 20 mg daily.  Continue for now.  Discussed side effects of long-term use.  May consider trial of discontinuance and only prn use in future.  May also consider referral to GI for upper EGD if symptoms  persist or worsen.       Relevant Medications   omeprazole (PRILOSEC) 20 MG capsule   Other Relevant Orders   CBC with Differential/Platelet   Magnesium     Endocrine   Controlled type 2 diabetes mellitus without complication, without long-term current use of insulin (HCC)    Chronic, stable.  Continue metformin 500 mg once daily for now.  HbA1c checked today, will adjust metformin as needed to get to goal.  Continue lifestyle modifications to help keep blood sugar down.  Follow up in 6 months.      Relevant Medications   benazepril (LOTENSIN) 40 MG tablet   metFORMIN (GLUCOPHAGE) 500 MG tablet   rosuvastatin (CRESTOR) 40 MG tablet   Other Relevant Orders   HgB A1c     Other   Hyperlipidemia    Chronic, ongoing.  On high-intensity statin, rosuvastatin 40 mg daily - continue this and refills given.  Cholesterol checked today.  Not interested in quitting smoking to minimize risk factors for further disease at this time, will be available to patient if he changes mind in future.  Continue working on other lifestyle changes.   Follow up 6 months or sooner of anything changes.      Relevant Medications   amLODipine (NORVASC) 10 MG tablet   benazepril (LOTENSIN) 40 MG tablet   colesevelam (WELCHOL) 625 MG tablet   metoprolol succinate (TOPROL-XL) 100 MG 24 hr tablet   rosuvastatin (CRESTOR) 40 MG tablet   Smoking greater than 40 pack years    Discussed smoking cessation - patient not interested at this time.  Will continue to be available to patient if he desires to stop smoking in future.  Collaborated with lung cancer screening folks to get yearly low-dose CT scheduled.       Other Visit Diagnoses    Annual physical exam    -  Primary   Prostate cancer screening       Relevant Orders   PSA   Thyroid disorder screen       Relevant Orders   TSH       Discussed aspirin prophylaxis for myocardial infarction prevention and decision was made to continue ASA  LABORATORY  TESTING:  Health maintenance labs ordered today as discussed above.   The natural history of prostate cancer and ongoing controversy regarding screening and potential treatment outcomes of prostate cancer has been discussed with the patient. The meaning of a false positive PSA and a false negative PSA has been discussed. He indicates understanding of the limitations of this screening test and wishes to proceed with screening PSA testing.   IMMUNIZATIONS:   - Tdap: Tetanus vaccination status reviewed: last tetanus booster within 10 years. - Influenza: Postponed to flu season -  Pneumovax: Up to date - Prevnar: Not applicable - HPV: Not applicable - Zostavax vaccine: Not applicable  SCREENING: - Colonoscopy: Up to date  Discussed with patient purpose of the colonoscopy is to detect colon cancer at curable precancerous or early stages   - AAA Screening: Done elsewhere  -Hearing Test: Not applicable  -Spirometry: Not applicable; may want to consider in future   PATIENT COUNSELING:    Sexuality: Discussed sexually transmitted diseases, partner selection, use of condoms, avoidance of unintended pregnancy  and contraceptive alternatives.   Advised to avoid cigarette smoking.  I discussed with the patient that most people either abstain from alcohol or drink within safe limits (<=14/week and <=4 drinks/occasion for males, <=7/weeks and <= 3 drinks/occasion for females) and that the risk for alcohol disorders and other health effects rises proportionally with the number of drinks per week and how often a drinker exceeds daily limits.  Discussed cessation/primary prevention of drug use and availability of treatment for abuse.   Diet: Encouraged to adjust caloric intake to maintain  or achieve ideal body weight, to reduce intake of dietary saturated fat and total fat, to limit sodium intake by avoiding high sodium foods and not adding table salt, and to maintain adequate dietary potassium and  calcium preferably from fresh fruits, vegetables, and low-fat dairy products.    stressed the importance of regular exercise  Injury prevention: Discussed safety belts, safety helmets, smoke detector, smoking near bedding or upholstery.   Dental health: Discussed importance of regular tooth brushing, flossing, and dental visits.   Follow up plan: NEXT PREVENTATIVE PHYSICAL DUE IN 1 YEAR. Return in about 6 months (around 02/20/2020) for follow up.

## 2019-08-20 NOTE — Telephone Encounter (Signed)
Patient has been notified that annual lung cancer screening low dose CT scan is due currently or will be in near future. Confirmed that patient is within the age range of 55-77, and asymptomatic, (no signs or symptoms of lung cancer). Patient denies illness that would prevent curative treatment for lung cancer if found. Verified smoking history, (current, 44 pack year). The shared decision making visit was done 07/28/15. Patient is agreeable for CT scan being scheduled.

## 2019-08-21 LAB — COMPREHENSIVE METABOLIC PANEL
ALT: 43 IU/L (ref 0–44)
AST: 33 IU/L (ref 0–40)
Albumin/Globulin Ratio: 1.9 (ref 1.2–2.2)
Albumin: 4.7 g/dL (ref 3.8–4.8)
Alkaline Phosphatase: 91 IU/L (ref 48–121)
BUN/Creatinine Ratio: 18 (ref 10–24)
BUN: 17 mg/dL (ref 8–27)
Bilirubin Total: 0.4 mg/dL (ref 0.0–1.2)
CO2: 22 mmol/L (ref 20–29)
Calcium: 9.6 mg/dL (ref 8.6–10.2)
Chloride: 99 mmol/L (ref 96–106)
Creatinine, Ser: 0.97 mg/dL (ref 0.76–1.27)
GFR calc Af Amer: 96 mL/min/{1.73_m2} (ref >59)
GFR calc non Af Amer: 83 mL/min/{1.73_m2} (ref >59)
Globulin, Total: 2.5 g/dL (ref 1.5–4.5)
Glucose: 166 mg/dL — ABNORMAL HIGH (ref 65–99)
Potassium: 4.3 mmol/L (ref 3.5–5.2)
Sodium: 137 mmol/L (ref 134–144)
Total Protein: 7.2 g/dL (ref 6.0–8.5)

## 2019-08-21 LAB — CBC WITH DIFFERENTIAL/PLATELET
Basophils Absolute: 0.1 10*3/uL (ref 0.0–0.2)
Basos: 1 %
EOS (ABSOLUTE): 0.1 10*3/uL (ref 0.0–0.4)
Eos: 1 %
Hematocrit: 52.6 % — ABNORMAL HIGH (ref 37.5–51.0)
Hemoglobin: 17.8 g/dL — ABNORMAL HIGH (ref 13.0–17.7)
Immature Grans (Abs): 0.1 10*3/uL (ref 0.0–0.1)
Immature Granulocytes: 1 %
Lymphocytes Absolute: 2.4 10*3/uL (ref 0.7–3.1)
Lymphs: 18 %
MCH: 31.6 pg (ref 26.6–33.0)
MCHC: 33.8 g/dL (ref 31.5–35.7)
MCV: 93 fL (ref 79–97)
Monocytes Absolute: 1.2 10*3/uL — ABNORMAL HIGH (ref 0.1–0.9)
Monocytes: 9 %
Neutrophils Absolute: 9.2 10*3/uL — ABNORMAL HIGH (ref 1.4–7.0)
Neutrophils: 70 %
Platelets: 160 10*3/uL (ref 150–450)
RBC: 5.63 x10E6/uL (ref 4.14–5.80)
RDW: 13.8 % (ref 11.6–15.4)
WBC: 12.9 10*3/uL — ABNORMAL HIGH (ref 3.4–10.8)

## 2019-08-21 LAB — LIPID PANEL W/O CHOL/HDL RATIO
Cholesterol, Total: 166 mg/dL (ref 100–199)
HDL: 40 mg/dL (ref >39)
LDL Chol Calc (NIH): 57 mg/dL (ref 0–99)
Triglycerides: 455 mg/dL — ABNORMAL HIGH (ref 0–149)
VLDL Cholesterol Cal: 69 mg/dL — ABNORMAL HIGH (ref 5–40)

## 2019-08-21 LAB — HEMOGLOBIN A1C
Est. average glucose Bld gHb Est-mCnc: 151 mg/dL
Hgb A1c MFr Bld: 6.9 % — ABNORMAL HIGH (ref 4.8–5.6)

## 2019-08-21 LAB — MAGNESIUM: Magnesium: 1.5 mg/dL — ABNORMAL LOW (ref 1.6–2.3)

## 2019-08-21 LAB — TSH: TSH: 1.62 u[IU]/mL (ref 0.450–4.500)

## 2019-08-21 LAB — PSA: Prostate Specific Ag, Serum: 3.8 ng/mL (ref 0.0–4.0)

## 2019-08-24 ENCOUNTER — Telehealth: Payer: Self-pay | Admitting: Nurse Practitioner

## 2019-08-24 DIAGNOSIS — D751 Secondary polycythemia: Secondary | ICD-10-CM

## 2019-08-24 DIAGNOSIS — R972 Elevated prostate specific antigen [PSA]: Secondary | ICD-10-CM

## 2019-08-24 MED ORDER — OMEGA-3-ACID ETHYL ESTERS 1 G PO CAPS
2.0000 g | ORAL_CAPSULE | Freq: Two times a day (BID) | ORAL | 1 refills | Status: DC
Start: 2019-08-24 — End: 2019-12-21

## 2019-08-24 NOTE — Telephone Encounter (Signed)
Called patient to discuss lab results from last week.  Worried about triglyceride levels - will send in prescription for Lovaza to take 2 capsules bid.  Will also recheck CBC in 1 week for abnormal levels.  With steady increase in PSA - will send referral to Urology.  Patient verbalized understanding and all questions answered.

## 2019-08-27 ENCOUNTER — Ambulatory Visit
Admission: RE | Admit: 2019-08-27 | Discharge: 2019-08-27 | Disposition: A | Discharge status: Home / Self Care | Payer: 59 | Source: Ambulatory Visit | Attending: Nurse Practitioner | Admitting: Nurse Practitioner

## 2019-08-27 ENCOUNTER — Other Ambulatory Visit: Payer: Self-pay

## 2019-08-27 DIAGNOSIS — Z122 Encounter for screening for malignant neoplasm of respiratory organs: Secondary | ICD-10-CM

## 2019-08-27 DIAGNOSIS — Z87891 Personal history of nicotine dependence: Secondary | ICD-10-CM | POA: Diagnosis present

## 2019-08-29 NOTE — Progress Notes (Addendum)
08/30/2019 2:34 PM   Maxwell Young May 05, 1955 [id number]  Referring provider: Helayne Seminole, NP [address] [place],  [place] [address] No chief complaint on file.   HPI: Maxwell Young is a 64 y.o. male seen at the request of Maxwell Guadalajara, NP for evaluation of an elevated PSA.   -Recent PSA was 3.8 as of 08/20/2019. -Patient has a history of diabetes, CAD and COPD.  -No prior urologic history -Mild to moderate BPH symptoms including urinary frequency, urgency and urinary hesitancy  -Denies gross hematuria and dysuria. -Patient has a 40+ pack smoking history. -No FHx of prostate cancer.  PSA trend: Component     Latest Ref Rng & Units 12/14/2014 01/01/2016 02/24/2017 03/02/2018  Prostate Specific Ag, Serum     0.0 - 4.0 ng/mL 2.0 1.9 2.3 2.6   Component     Latest Ref Rng & Units 08/20/2019  Prostate Specific Ag, Serum     0.0 - 4.0 ng/mL 3.8    PMH: Past Medical History:  Diagnosis Date  . CAD (coronary artery disease)   . Colon polyps 2011  . Diverticula of colon 2011  . Elevated fasting glucose   . GERD (gastroesophageal reflux disease)   . History of hemorrhoids 2011   internal  . Hyperlipidemia   . Hypertension   . Personal history of tobacco use, presenting hazards to health 07/27/2015  . Wears dentures    full upper and lower    Surgical History: Past Surgical History:  Procedure Laterality Date  . CERVICAL FUSION  06/09/003  . COLONOSCOPY    . COLONOSCOPY WITH PROPOFOL N/A 07/24/2015   Procedure: COLONOSCOPY WITH PROPOFOL;  Surgeon: Maxwell Lame, MD;  Location: [place];  Service: Endoscopy;  Laterality: N/A;  . CORONARY ARTERY BYPASS GRAFT  2008   [facility] - 3 vessel  . POLYPECTOMY  07/24/2015   Procedure: POLYPECTOMY;  Surgeon: Maxwell Lame, MD;  Location: [place];  Service: Endoscopy;;    Home Medications:  Allergies as of 08/30/2019   No Known Allergies     Medication List       Accurate as of August 30, 2019  2:34  PM. If you have any questions, ask your nurse or doctor.        amLODipine 10 MG tablet Commonly known as: NORVASC Take 1 tablet (10 mg total) by mouth daily.   aspirin EC 325 MG tablet Take 325 mg by mouth daily.   benazepril 40 MG tablet Commonly known as: LOTENSIN Take 1 tablet (40 mg total) by mouth daily.   colesevelam 625 MG tablet Commonly known as: WELCHOL Take 3 tablets (1,875 mg total) by mouth 2 (two) times daily.   KRILL OIL PO Take by mouth.   metFORMIN 500 MG tablet Commonly known as: GLUCOPHAGE Take 1 tablet (500 mg total) by mouth daily after supper.   metoprolol succinate 100 MG 24 hr tablet Commonly known as: TOPROL-XL Take 1 tablet (100 mg total) by mouth daily. Take with or immediately following a meal.   MULTIVITAMIN ADULT PO Take by mouth daily.   omega-3 acid ethyl esters 1 g capsule Commonly known as: LOVAZA Take 2 capsules (2 g total) by mouth 2 (two) times daily.   omeprazole 20 MG capsule Commonly known as: PRILOSEC Take 20 mg by mouth daily.   rosuvastatin 40 MG tablet Commonly known as: CRESTOR Take 1 tablet (40 mg total) by mouth daily.       Allergies: No Known  Allergies  Family History: Family History  Problem Relation Age of Onset  . Hypertension Mother   . Heart disease Mother        CAD  . Cancer Father        colon, prostate  . Stroke Father   . Parkinson's disease Father     Social History:  reports that he has been smoking cigarettes. He has a 40.00 pack-year smoking history. He has never used smokeless tobacco. He reports that he does not drink alcohol and does not use drugs.   Physical Exam: BP (!) 138/92   Pulse (!) 44   Ht 5\' 10"  (1.778 m)   Wt 182 lb 14.4 oz (83 kg)   BMI 26.24 kg/m   Constitutional:  Alert and oriented, No acute distress. HEENT: [place] AT, moist mucus membranes.  Trachea midline, no masses. Cardiovascular: No clubbing, cyanosis, or edema. Respiratory: Normal respiratory effort, no  increased work of breathing. GI: Abdomen is soft, nontender, nondistended, no abdominal masses GU: Phallus without lesions, testes descended bilaterally without masses or tenderness.  Prostate 45 g, smooth without nodules Neurologic: Grossly intact, no focal deficits, moving all 4 extremities. Psychiatric: Normal mood and affect.  Laboratory Data:  Lab Results  Component Value Date   CREATININE 0.97 08/20/2019    Assessment & Plan:    1.  Abnormal PSA - Recent PSA is 3.8 as of 08/20/2019.  We discussed this PSA was elevated above baseline however that an abnormal PSA velocity is based on 3 successive readings and not an isolated reading  - Although PSA is a prostate cancer screening test he was informed that cancer is not the most common cause of an elevated PSA. Other potential causes including BPH and inflammation were discussed. He was informed that the only way to adequately diagnose prostate cancer would be a transrectal ultrasound and biopsy of the prostate. The procedure was discussed including potential risks of bleeding and infection/sepsis. He was also informed that a negative biopsy does not conclusively rule out the possibility that prostate cancer may be present and that continued monitoring is required. The use of newer adjunctive blood tests including PHI and 4kScore were discussed. The use of multiparametric prostate MRI was also discussed however is not typically used for initial evaluation of an elevated PSA. Continued periodic surveillance was also discussed.  -[facility]osin [facility] sent [facility] in [place], [place].  -RTC in 1 month for repeat PSA.   2.  BPH with LUTS - He will assess symptom improvement with the above 30-day trial of tamsulosin   [facility] [address] [place], [place] [address] [phone]  I, Maxwell Young, am acting as a scribe for Dr. Nicki Reaper C. Tranae Young,  I have reviewed the above documentation for accuracy and  completeness, and I agree with the above.    Maxwell Sons, MD

## 2019-08-30 ENCOUNTER — Ambulatory Visit (INDEPENDENT_AMBULATORY_CARE_PROVIDER_SITE_OTHER): Payer: 59 | Admitting: Urology

## 2019-08-30 ENCOUNTER — Other Ambulatory Visit: Payer: Self-pay

## 2019-08-30 VITALS — BP 138/92 | HR 44 | Ht 70.0 in | Wt 182.9 lb

## 2019-08-30 DIAGNOSIS — N401 Enlarged prostate with lower urinary tract symptoms: Secondary | ICD-10-CM | POA: Diagnosis not present

## 2019-08-30 DIAGNOSIS — R972 Elevated prostate specific antigen [PSA]: Secondary | ICD-10-CM

## 2019-08-30 MED ORDER — TAMSULOSIN HCL 0.4 MG PO CAPS
0.4000 mg | ORAL_CAPSULE | Freq: Every day | ORAL | 0 refills | Status: DC
Start: 2019-08-30 — End: 2019-09-23

## 2019-08-31 ENCOUNTER — Encounter: Payer: Self-pay | Admitting: Urology

## 2019-08-31 DIAGNOSIS — R972 Elevated prostate specific antigen [PSA]: Secondary | ICD-10-CM | POA: Insufficient documentation

## 2019-08-31 DIAGNOSIS — N401 Enlarged prostate with lower urinary tract symptoms: Secondary | ICD-10-CM | POA: Insufficient documentation

## 2019-09-01 ENCOUNTER — Other Ambulatory Visit: Payer: 59

## 2019-09-01 ENCOUNTER — Other Ambulatory Visit: Payer: Self-pay

## 2019-09-01 DIAGNOSIS — D751 Secondary polycythemia: Secondary | ICD-10-CM

## 2019-09-01 DIAGNOSIS — J189 Pneumonia, unspecified organism: Secondary | ICD-10-CM

## 2019-09-02 ENCOUNTER — Telehealth: Payer: Self-pay | Admitting: Nurse Practitioner

## 2019-09-02 ENCOUNTER — Encounter: Payer: Self-pay | Admitting: Nurse Practitioner

## 2019-09-02 ENCOUNTER — Other Ambulatory Visit: Payer: Self-pay | Admitting: Nurse Practitioner

## 2019-09-02 LAB — CBC WITH DIFFERENTIAL/PLATELET
Basophils Absolute: 0.1 10*3/uL (ref 0.0–0.2)
Basos: 0 %
EOS (ABSOLUTE): 0.1 10*3/uL (ref 0.0–0.4)
Eos: 1 %
Hematocrit: 50.2 % (ref 37.5–51.0)
Hemoglobin: 16.8 g/dL (ref 13.0–17.7)
Immature Grans (Abs): 0.1 10*3/uL (ref 0.0–0.1)
Immature Granulocytes: 1 %
Lymphocytes Absolute: 2.2 10*3/uL (ref 0.7–3.1)
Lymphs: 18 %
MCH: 31.6 pg (ref 26.6–33.0)
MCHC: 33.5 g/dL (ref 31.5–35.7)
MCV: 94 fL (ref 79–97)
Monocytes Absolute: 1 10*3/uL — ABNORMAL HIGH (ref 0.1–0.9)
Monocytes: 8 %
Neutrophils Absolute: 9.3 10*3/uL — ABNORMAL HIGH (ref 1.4–7.0)
Neutrophils: 72 %
Platelets: 171 10*3/uL (ref 150–450)
RBC: 5.32 x10E6/uL (ref 4.14–5.80)
RDW: 14.2 % (ref 11.6–15.4)
WBC: 12.7 10*3/uL — ABNORMAL HIGH (ref 3.4–10.8)

## 2019-09-02 MED ORDER — AZITHROMYCIN 250 MG PO TABS: ORAL_TABLET | ORAL | 0 refills | Status: DC

## 2019-09-02 NOTE — Telephone Encounter (Signed)
Called patient and discussed findings from low-dose CT showing atypical infection.  Along with continued elevated WBC counts, thinking this may be an typical pneumonia presentation.  Will treat with azithromycin and re-check blood work in 4 weeks.  May also consider a 6-week chest x-ray follow up after treatment to confirm resolution.

## 2019-09-06 ENCOUNTER — Encounter: Payer: Self-pay | Admitting: *Deleted

## 2019-09-21 ENCOUNTER — Other Ambulatory Visit: Payer: 59

## 2019-09-21 ENCOUNTER — Other Ambulatory Visit: Payer: Self-pay

## 2019-09-21 DIAGNOSIS — J189 Pneumonia, unspecified organism: Secondary | ICD-10-CM

## 2019-09-22 LAB — CBC WITH DIFFERENTIAL/PLATELET
Basophils Absolute: 0.1 10*3/uL (ref 0.0–0.2)
Basos: 1 %
EOS (ABSOLUTE): 0.1 10*3/uL (ref 0.0–0.4)
Eos: 1 %
Hematocrit: 48.9 % (ref 37.5–51.0)
Hemoglobin: 16.3 g/dL (ref 13.0–17.7)
Immature Grans (Abs): 0 10*3/uL (ref 0.0–0.1)
Immature Granulocytes: 0 %
Lymphocytes Absolute: 2.2 10*3/uL (ref 0.7–3.1)
Lymphs: 23 %
MCH: 31.3 pg (ref 26.6–33.0)
MCHC: 33.3 g/dL (ref 31.5–35.7)
MCV: 94 fL (ref 79–97)
Monocytes Absolute: 0.9 10*3/uL (ref 0.1–0.9)
Monocytes: 9 %
Neutrophils Absolute: 6.5 10*3/uL (ref 1.4–7.0)
Neutrophils: 66 %
Platelets: 153 10*3/uL (ref 150–450)
RBC: 5.21 x10E6/uL (ref 4.14–5.80)
RDW: 13.9 % (ref 11.6–15.4)
WBC: 9.8 10*3/uL (ref 3.4–10.8)

## 2019-09-22 NOTE — Progress Notes (Signed)
Contacted via MyChart  Good afternoon Maxwell Young, your white blood cell count and neutrophils have now trended down to normal range.  We can recheck this at next visit, but overall good news.  Have a great day!!

## 2019-09-23 ENCOUNTER — Other Ambulatory Visit: Payer: Self-pay | Admitting: Urology

## 2019-09-24 ENCOUNTER — Other Ambulatory Visit: Payer: Self-pay

## 2019-09-24 DIAGNOSIS — N401 Enlarged prostate with lower urinary tract symptoms: Secondary | ICD-10-CM

## 2019-09-24 DIAGNOSIS — R972 Elevated prostate specific antigen [PSA]: Secondary | ICD-10-CM

## 2019-09-28 ENCOUNTER — Other Ambulatory Visit: Payer: 59

## 2019-09-28 ENCOUNTER — Other Ambulatory Visit: Payer: Self-pay

## 2019-09-28 DIAGNOSIS — N401 Enlarged prostate with lower urinary tract symptoms: Secondary | ICD-10-CM

## 2019-09-28 DIAGNOSIS — R972 Elevated prostate specific antigen [PSA]: Secondary | ICD-10-CM

## 2019-09-29 LAB — PSA: Prostate Specific Ag, Serum: 3.1 ng/mL (ref 0.0–4.0)

## 2019-09-30 ENCOUNTER — Other Ambulatory Visit: Payer: Self-pay | Admitting: *Deleted

## 2019-09-30 ENCOUNTER — Encounter: Payer: Self-pay | Admitting: Urology

## 2019-09-30 MED ORDER — TAMSULOSIN HCL 0.4 MG PO CAPS: 0.4000 mg | ORAL_CAPSULE | Freq: Every day | ORAL | 1 refills | Status: DC

## 2019-10-01 ENCOUNTER — Telehealth: Payer: Self-pay | Admitting: *Deleted

## 2019-10-01 NOTE — Telephone Encounter (Signed)
-----   Message from Abbie Sons, MD sent at 10/01/2019  8:18 AM EDT ----- Repeat PSA better at 3.1 and within acceptable range.  Would recommend repeating PSA with his PCP January/February 2022

## 2019-10-01 NOTE — Telephone Encounter (Signed)
Notified patient as instructed, patient pleased. Discussed follow-up appointments, patient agrees  

## 2019-10-20 ENCOUNTER — Other Ambulatory Visit: Payer: Self-pay

## 2019-10-20 ENCOUNTER — Other Ambulatory Visit: Payer: Self-pay | Admitting: Urology

## 2019-10-20 MED ORDER — METFORMIN HCL 500 MG PO TABS: 500.0000 mg | ORAL_TABLET | Freq: Every day | ORAL | 1 refills | Status: DC

## 2019-10-20 NOTE — Telephone Encounter (Signed)
[facility] faxed a RX refill request

## 2019-12-21 ENCOUNTER — Other Ambulatory Visit: Payer: Self-pay

## 2019-12-21 MED ORDER — OMEGA-3-ACID ETHYL ESTERS 1 G PO CAPS: 2.0000 g | ORAL_CAPSULE | Freq: Two times a day (BID) | ORAL | 1 refills | Status: DC

## 2019-12-21 NOTE — Telephone Encounter (Signed)
2nd request from pharmacy

## 2020-01-27 ENCOUNTER — Other Ambulatory Visit: Payer: Self-pay | Admitting: Urology

## 2020-01-28 ENCOUNTER — Other Ambulatory Visit: Payer: Self-pay

## 2020-01-28 DIAGNOSIS — I10 Essential (primary) hypertension: Secondary | ICD-10-CM

## 2020-01-28 DIAGNOSIS — E7800 Pure hypercholesterolemia, unspecified: Secondary | ICD-10-CM

## 2020-01-28 NOTE — Telephone Encounter (Signed)
Patient last seen 08/20/19 and has appointment 02/21/20. Mail order request.

## 2020-01-31 ENCOUNTER — Other Ambulatory Visit: Payer: Self-pay | Admitting: Urology

## 2020-01-31 MED ORDER — AMLODIPINE BESYLATE 10 MG PO TABS: 10.0000 mg | ORAL_TABLET | Freq: Every day | ORAL | 1 refills | Status: DC

## 2020-01-31 MED ORDER — ROSUVASTATIN CALCIUM 40 MG PO TABS: 40.0000 mg | ORAL_TABLET | Freq: Every day | ORAL | 1 refills | Status: DC

## 2020-01-31 MED ORDER — METOPROLOL SUCCINATE ER 100 MG PO TB24: 100.0000 mg | ORAL_TABLET | Freq: Every day | ORAL | 1 refills | Status: DC

## 2020-01-31 MED ORDER — BENAZEPRIL HCL 40 MG PO TABS: 40.0000 mg | ORAL_TABLET | Freq: Every day | ORAL | 1 refills | Status: DC

## 2020-02-20 NOTE — Progress Notes (Signed)
BP 134/78   Pulse 75   Temp 97.9 F (36.6 C)   Ht 6' 0.01" (1.829 m)   Wt 189 lb 4 oz (85.8 kg)   SpO2 100%   BMI 25.66 kg/m    Subjective:    Patient ID: Maxwell Young, male    DOB: 1955/01/24, 65 y.o.   MRN: [id number]  HPI: Maxwell Young is a 65 y.o. male  Chief Complaint  Patient presents with  . Hypertension  . Hyperlipidemia  . Diabetes   HYPERTENSION / HYPERLIPIDEMIA Satisfied with current treatment? yes Duration of hypertension: chronic BP monitoring frequency: a few times a week BP range: 129/69 BP medication side effects: no Past BP meds: benazepril, amlodipine, metoprolol Duration of hyperlipidemia: chronic Cholesterol medication side effects: no Cholesterol supplements: fish oil Past cholesterol medications: crestor, welchol Medication compliance: excellent compliance Aspirin: yes Recent stressors: no Recurrent headaches: no Visual changes: no Palpitations: no Dyspnea: no Chest pain: no Lower extremity edema: no Dizzy/lightheaded: no  DIABETES Hypoglycemic episodes:no Polydipsia/polyuria: no Visual disturbance: no Chest pain: no Paresthesias: no Glucose Monitoring: no  Accucheck frequency: Not Checking Taking Insulin?: no Blood Pressure Monitoring: rarely Retinal Examination: Not up to Date Foot Exam: Up to Date Diabetic Education: Completed Pneumovax: Declined Influenza: Declined Aspirin: yes  COPD COPD status: stable Satisfied with current treatment?: yes Oxygen use: no Dyspnea frequency: occasionally  Cough frequency:  occasionally  Rescue inhaler frequency:   occasionally  Limitation of activity: no Productive cough: no Pneumovax: Declined Influenza: Declined  Relevant past medical, surgical, family and social history reviewed and updated as indicated. Interim medical history since our last visit reviewed. Allergies and medications reviewed and updated.  Review of Systems  Constitutional: Negative.   Respiratory:  Negative.   Cardiovascular: Negative.   Gastrointestinal: Negative.   Musculoskeletal: Negative.   Psychiatric/Behavioral: Negative.     Per HPI unless specifically indicated above     Objective:    BP 134/78   Pulse 75   Temp 97.9 F (36.6 C)   Ht 6' 0.01" (1.829 m)   Wt 189 lb 4 oz (85.8 kg)   SpO2 100%   BMI 25.66 kg/m   Wt Readings from Last 3 Encounters:  02/21/20 189 lb 4 oz (85.8 kg)  08/30/19 182 lb 14.4 oz (83 kg)  08/27/19 185 lb (83.9 kg)    Physical Exam Vitals and nursing note reviewed.  Constitutional:      General: He is not in acute distress.    Appearance: Normal appearance. He is not ill-appearing, toxic-appearing or diaphoretic.  HENT:     Head: Normocephalic and atraumatic.     Right Ear: External ear normal.     Left Ear: External ear normal.     Nose: Nose normal.     Mouth/Throat:     Mouth: Mucous membranes are moist.     Pharynx: Oropharynx is clear.  Eyes:     General: No scleral icterus.       Right eye: No discharge.        Left eye: No discharge.     Extraocular Movements: Extraocular movements intact.     Conjunctiva/sclera: Conjunctivae normal.     Pupils: Pupils are equal, round, and reactive to light.  Cardiovascular:     Rate and Rhythm: Normal rate and regular rhythm.     Pulses: Normal pulses.     Heart sounds: Normal heart sounds. No murmur heard. No friction rub. No gallop.   Pulmonary:  Effort: Pulmonary effort is normal. No respiratory distress.     Breath sounds: Normal breath sounds. No stridor. No wheezing, rhonchi or rales.  Chest:     Chest wall: No tenderness.  Musculoskeletal:        General: Normal range of motion.     Cervical back: Normal range of motion and neck supple.  Skin:    General: Skin is warm and dry.     Capillary Refill: Capillary refill takes less than 2 seconds.     Coloration: Skin is not jaundiced or pale.     Findings: No bruising, erythema, lesion or rash.  Neurological:      General: No focal deficit present.     Mental Status: He is alert and oriented to person, place, and time. Mental status is at baseline.  Psychiatric:        Mood and Affect: Mood normal.        Behavior: Behavior normal.        Thought Content: Thought content normal.        Judgment: Judgment normal.     Results for orders placed or performed in visit on 09/28/19  PSA  Result Value Ref Range   Prostate Specific Ag, Serum 3.1 0.0 - 4.0 ng/mL      Assessment & Plan:   Problem List Items Addressed This Visit      Cardiovascular and Mediastinum   Essential hypertension - Primary    Under good control on current regimen. Continue current regimen. Continue to monitor. Call with any concerns. Refills given. Labs drawn today.        Relevant Medications   omega-3 acid ethyl esters (LOVAZA) 1 g capsule   Other Relevant Orders   Comprehensive metabolic panel   CAD (coronary artery disease)    Will keep BP and cholesterol under good control. Continue to monitor Call with any concerns.       Relevant Medications   omega-3 acid ethyl esters (LOVAZA) 1 g capsule   Other Relevant Orders   CBC with Differential/Platelet   Comprehensive metabolic panel   Aortic atherosclerosis (HCC)    Will keep BP and cholesterol under good control. Continue to monitor Call with any concerns.       Relevant Medications   omega-3 acid ethyl esters (LOVAZA) 1 g capsule   Other Relevant Orders   CBC with Differential/Platelet   Comprehensive metabolic panel   PAD (peripheral artery disease) (HCC)    Will keep BP and cholesterol under good control. Continue to monitor Call with any concerns.       Relevant Medications   omega-3 acid ethyl esters (LOVAZA) 1 g capsule   Other Relevant Orders   Comprehensive metabolic panel     Respiratory   COPD (chronic obstructive pulmonary disease) with emphysema ([place])    Under good control on current regimen. Continue current regimen. Continue to monitor.  Call with any concerns. Refills given. Labs drawn today.       Relevant Orders   CBC with Differential/Platelet   Comprehensive metabolic panel     Endocrine   Controlled type 2 diabetes mellitus without complication, without long-term current use of insulin ([place])    Under good control on current regimen. Continue current regimen. Continue to monitor. Call with any concerns. Refills given. Labs drawn today.       Relevant Medications   metFORMIN (GLUCOPHAGE) 500 MG tablet   Other Relevant Orders   Bayer DCA Hb A1c Waived   Comprehensive  metabolic panel     Other   Hyperlipidemia    Under good control on current regimen. Continue current regimen. Continue to monitor. Call with any concerns. Refills given. Labs drawn today.       Relevant Medications   omega-3 acid ethyl esters (LOVAZA) 1 g capsule   Other Relevant Orders   Comprehensive metabolic panel   Lipid Panel w/o Chol/HDL Ratio   Abnormal PSA    Rechecking labs today. Await results. Treat as needed.       Relevant Orders   Comprehensive metabolic panel   PSA       Follow up plan: Return in about 6 months (around 08/20/2020) for physical.

## 2020-02-21 ENCOUNTER — Encounter: Payer: Self-pay | Admitting: Family Medicine

## 2020-02-21 ENCOUNTER — Other Ambulatory Visit: Payer: Self-pay

## 2020-02-21 ENCOUNTER — Ambulatory Visit (INDEPENDENT_AMBULATORY_CARE_PROVIDER_SITE_OTHER): Payer: 59 | Admitting: Family Medicine

## 2020-02-21 VITALS — BP 134/78 | HR 75 | Temp 97.9°F | Ht 72.01 in | Wt 189.2 lb

## 2020-02-21 DIAGNOSIS — I700 Atherosclerosis of aorta: Secondary | ICD-10-CM

## 2020-02-21 DIAGNOSIS — E119 Type 2 diabetes mellitus without complications: Secondary | ICD-10-CM

## 2020-02-21 DIAGNOSIS — E7800 Pure hypercholesterolemia, unspecified: Secondary | ICD-10-CM

## 2020-02-21 DIAGNOSIS — R972 Elevated prostate specific antigen [PSA]: Secondary | ICD-10-CM

## 2020-02-21 DIAGNOSIS — I739 Peripheral vascular disease, unspecified: Secondary | ICD-10-CM

## 2020-02-21 DIAGNOSIS — J439 Emphysema, unspecified: Secondary | ICD-10-CM

## 2020-02-21 DIAGNOSIS — I2510 Atherosclerotic heart disease of native coronary artery without angina pectoris: Secondary | ICD-10-CM | POA: Diagnosis not present

## 2020-02-21 DIAGNOSIS — I10 Essential (primary) hypertension: Secondary | ICD-10-CM

## 2020-02-21 LAB — BAYER DCA HB A1C WAIVED: HB A1C (BAYER DCA - WAIVED): 6.7 % (ref <7.0)

## 2020-02-21 MED ORDER — METFORMIN HCL 500 MG PO TABS: 500.0000 mg | ORAL_TABLET | Freq: Every day | ORAL | 1 refills | Status: DC

## 2020-02-21 MED ORDER — OMEGA-3-ACID ETHYL ESTERS 1 G PO CAPS: 2.0000 g | ORAL_CAPSULE | Freq: Two times a day (BID) | ORAL | 1 refills | Status: DC

## 2020-02-21 NOTE — Assessment & Plan Note (Signed)
Under good control on current regimen. Continue current regimen. Continue to monitor. Call with any concerns. Refills given. Labs drawn today.   

## 2020-02-21 NOTE — Assessment & Plan Note (Signed)
Will keep BP and cholesterol under good control. Continue to monitor. Call with any concerns.  

## 2020-02-21 NOTE — Assessment & Plan Note (Signed)
Rechecking labs today. Await results. Treat as needed.  °

## 2020-02-22 LAB — CBC WITH DIFFERENTIAL/PLATELET
Basophils Absolute: 0 10*3/uL (ref 0.0–0.2)
Basos: 0 %
EOS (ABSOLUTE): 0.1 10*3/uL (ref 0.0–0.4)
Eos: 1 %
Hematocrit: 50 % (ref 37.5–51.0)
Hemoglobin: 16.7 g/dL (ref 13.0–17.7)
Immature Grans (Abs): 0 10*3/uL (ref 0.0–0.1)
Immature Granulocytes: 0 %
Lymphocytes Absolute: 1.7 10*3/uL (ref 0.7–3.1)
Lymphs: 21 %
MCH: 31 pg (ref 26.6–33.0)
MCHC: 33.4 g/dL (ref 31.5–35.7)
MCV: 93 fL (ref 79–97)
Monocytes Absolute: 0.8 10*3/uL (ref 0.1–0.9)
Monocytes: 9 %
Neutrophils Absolute: 5.6 10*3/uL (ref 1.4–7.0)
Neutrophils: 69 %
Platelets: 138 10*3/uL — ABNORMAL LOW (ref 150–450)
RBC: 5.39 x10E6/uL (ref 4.14–5.80)
RDW: 13.7 % (ref 11.6–15.4)
WBC: 8.2 10*3/uL (ref 3.4–10.8)

## 2020-02-22 LAB — COMPREHENSIVE METABOLIC PANEL
ALT: 41 IU/L (ref 0–44)
AST: 32 IU/L (ref 0–40)
Albumin/Globulin Ratio: 1.8 (ref 1.2–2.2)
Albumin: 4.6 g/dL (ref 3.8–4.8)
Alkaline Phosphatase: 88 IU/L (ref 44–121)
BUN/Creatinine Ratio: 12 (ref 10–24)
BUN: 12 mg/dL (ref 8–27)
Bilirubin Total: 0.5 mg/dL (ref 0.0–1.2)
CO2: 22 mmol/L (ref 20–29)
Calcium: 9.3 mg/dL (ref 8.6–10.2)
Chloride: 102 mmol/L (ref 96–106)
Creatinine, Ser: 1.04 mg/dL (ref 0.76–1.27)
GFR calc Af Amer: 89 mL/min/{1.73_m2} (ref >59)
GFR calc non Af Amer: 77 mL/min/{1.73_m2} (ref >59)
Globulin, Total: 2.5 g/dL (ref 1.5–4.5)
Glucose: 209 mg/dL — ABNORMAL HIGH (ref 65–99)
Potassium: 4.8 mmol/L (ref 3.5–5.2)
Sodium: 138 mmol/L (ref 134–144)
Total Protein: 7.1 g/dL (ref 6.0–8.5)

## 2020-02-22 LAB — LIPID PANEL W/O CHOL/HDL RATIO
Cholesterol, Total: 144 mg/dL (ref 100–199)
HDL: 41 mg/dL (ref >39)
LDL Chol Calc (NIH): 61 mg/dL (ref 0–99)
Triglycerides: 266 mg/dL — ABNORMAL HIGH (ref 0–149)
VLDL Cholesterol Cal: 42 mg/dL — ABNORMAL HIGH (ref 5–40)

## 2020-02-22 LAB — PSA: Prostate Specific Ag, Serum: 3.3 ng/mL (ref 0.0–4.0)

## 2020-07-20 ENCOUNTER — Other Ambulatory Visit: Payer: Self-pay | Admitting: Family Medicine

## 2020-08-06 ENCOUNTER — Other Ambulatory Visit: Payer: Self-pay | Admitting: Family Medicine

## 2020-08-06 DIAGNOSIS — I10 Essential (primary) hypertension: Secondary | ICD-10-CM

## 2020-08-06 NOTE — Telephone Encounter (Signed)
Requested Prescriptions  Pending Prescriptions Disp Refills  . amLODipine (NORVASC) 10 MG tablet [Pharmacy Med Name: amLODIPine Besylate 10 MG Oral Tablet] 90 tablet 1    Sig: TAKE 1 TABLET BY MOUTH  DAILY     Cardiovascular:  Calcium Channel Blockers Passed - 08/06/2020  1:30 AM      Passed - Last BP in normal range    BP Readings from Last 1 Encounters:  02/21/20 134/78         Passed - Valid encounter within last 6 months    Recent Outpatient Visits          5 months ago Essential hypertension   [place], [place], DO   11 months ago Annual physical exam   [facility] Eulogio Bear, NP   1 year ago Essential hypertension   [facility] [place], [place]   1 year ago Essential hypertension   [place], Jeannette How, MD   2 years ago Essential hypertension   [place], Jeannette How, MD      Future Appointments            In 2 weeks Wynetta Emery, Barb Merino, DO [place], [place]

## 2020-08-23 ENCOUNTER — Other Ambulatory Visit: Payer: Self-pay

## 2020-08-23 ENCOUNTER — Encounter: Payer: Self-pay | Admitting: Family Medicine

## 2020-08-23 ENCOUNTER — Ambulatory Visit (INDEPENDENT_AMBULATORY_CARE_PROVIDER_SITE_OTHER): Payer: 59 | Admitting: Family Medicine

## 2020-08-23 VITALS — BP 132/72 | HR 80 | Temp 97.8°F | Ht 71.8 in | Wt 179.2 lb

## 2020-08-23 DIAGNOSIS — K219 Gastro-esophageal reflux disease without esophagitis: Secondary | ICD-10-CM

## 2020-08-23 DIAGNOSIS — Z1211 Encounter for screening for malignant neoplasm of colon: Secondary | ICD-10-CM

## 2020-08-23 DIAGNOSIS — I10 Essential (primary) hypertension: Secondary | ICD-10-CM | POA: Diagnosis not present

## 2020-08-23 DIAGNOSIS — Z23 Encounter for immunization: Secondary | ICD-10-CM

## 2020-08-23 DIAGNOSIS — E7800 Pure hypercholesterolemia, unspecified: Secondary | ICD-10-CM

## 2020-08-23 DIAGNOSIS — Z0000 Encounter for general adult medical examination without abnormal findings: Secondary | ICD-10-CM | POA: Diagnosis not present

## 2020-08-23 DIAGNOSIS — E119 Type 2 diabetes mellitus without complications: Secondary | ICD-10-CM

## 2020-08-23 DIAGNOSIS — J439 Emphysema, unspecified: Secondary | ICD-10-CM | POA: Diagnosis not present

## 2020-08-23 DIAGNOSIS — I700 Atherosclerosis of aorta: Secondary | ICD-10-CM

## 2020-08-23 DIAGNOSIS — N401 Enlarged prostate with lower urinary tract symptoms: Secondary | ICD-10-CM

## 2020-08-23 LAB — URINALYSIS, ROUTINE W REFLEX MICROSCOPIC
Bilirubin, UA: NEGATIVE
Ketones, UA: NEGATIVE
Leukocytes,UA: NEGATIVE
Nitrite, UA: NEGATIVE
Protein,UA: NEGATIVE
Specific Gravity, UA: 1.015 (ref 1.005–1.030)
Urobilinogen, Ur: 0.2 mg/dL (ref 0.2–1.0)
pH, UA: 6 (ref 5.0–7.5)

## 2020-08-23 LAB — MICROSCOPIC EXAMINATION
Bacteria, UA: NONE SEEN
Epithelial Cells (non renal): NONE SEEN /hpf (ref 0–10)
WBC, UA: NONE SEEN /hpf (ref 0–5)

## 2020-08-23 LAB — MICROALBUMIN, URINE WAIVED
Creatinine, Urine Waived: 200 mg/dL (ref 10–300)
Microalb, Ur Waived: 80 mg/L — ABNORMAL HIGH (ref 0–19)

## 2020-08-23 LAB — BAYER DCA HB A1C WAIVED: HB A1C (BAYER DCA - WAIVED): 11.2 % — ABNORMAL HIGH (ref <7.0)

## 2020-08-23 MED ORDER — METOPROLOL SUCCINATE ER 100 MG PO TB24: 100.0000 mg | ORAL_TABLET | Freq: Every day | ORAL | 1 refills | Status: DC

## 2020-08-23 MED ORDER — TAMSULOSIN HCL 0.4 MG PO CAPS: 0.4000 mg | ORAL_CAPSULE | Freq: Every day | ORAL | 3 refills | Status: DC

## 2020-08-23 MED ORDER — METFORMIN HCL 500 MG PO TABS: 1000.0000 mg | ORAL_TABLET | Freq: Two times a day (BID) | ORAL | 1 refills | Status: DC

## 2020-08-23 MED ORDER — COLESEVELAM HCL 625 MG PO TABS: 1875.0000 mg | ORAL_TABLET | Freq: Two times a day (BID) | ORAL | 3 refills | Status: DC

## 2020-08-23 MED ORDER — OMEGA-3-ACID ETHYL ESTERS 1 G PO CAPS: 2.0000 g | ORAL_CAPSULE | Freq: Two times a day (BID) | ORAL | 1 refills | Status: DC

## 2020-08-23 MED ORDER — ROSUVASTATIN CALCIUM 40 MG PO TABS: 40.0000 mg | ORAL_TABLET | Freq: Every day | ORAL | 1 refills | Status: DC

## 2020-08-23 MED ORDER — BENAZEPRIL HCL 40 MG PO TABS: 40.0000 mg | ORAL_TABLET | Freq: Every day | ORAL | 1 refills | Status: DC

## 2020-08-23 NOTE — Assessment & Plan Note (Signed)
Under good control on current regimen. Continue current regimen. Continue to monitor. Call with any concerns. Refills given. Labs drawn today.   

## 2020-08-23 NOTE — Assessment & Plan Note (Signed)
Significantly worse with A1c of 11.2 up from 6.7- will confirm with send out and increase his metformin to '1000mg'$  BID. Recheck 3 months. Call with any concerns.

## 2020-08-23 NOTE — Progress Notes (Signed)
BP 132/72   Pulse 80   Temp 97.8 F (36.6 C) (Oral)   Ht 5' 11.8" (1.824 m)   Wt 179 lb 3.2 oz (81.3 kg)   SpO2 98%   BMI 24.44 kg/m    Subjective:    Patient ID: Maxwell Young, male    DOB: Jun 20, 1955, 65 y.o.   MRN: [id number]  HPI: Maxwell Young is a 65 y.o. male presenting on 08/23/2020 for comprehensive medical examination. Current medical complaints include:  DIABETES Hypoglycemic episodes:no Polydipsia/polyuria: no Visual disturbance: no Chest pain: no Paresthesias: no Glucose Monitoring: no  Accucheck frequency: Not Checking Taking Insulin?: no Blood Pressure Monitoring: not checking Retinal Examination: Not up to Date Foot Exam: Done today Diabetic Education: Completed Pneumovax: Not up to Date Influenza: Up to Date Aspirin: yes  HYPERTENSION / HYPERLIPIDEMIA Satisfied with current treatment? no Duration of hypertension: chronic BP monitoring frequency: not checking BP medication side effects: no Past BP meds: metoprolol, amlodipine, benazepril Duration of hyperlipidemia: chronic Cholesterol medication side effects: no Cholesterol supplements: fish oil Past cholesterol medications: atorvastatin, welchol, lovaza Medication compliance: excellent compliance Aspirin: yes Recent stressors: no Recurrent headaches: no Visual changes: no Palpitations: no Dyspnea: no Chest pain: no Lower extremity edema: no Dizzy/lightheaded: no  Interim Problems from his last visit: no  Depression Screen done today and results listed below:  Depression screen [facility] 2/9 08/23/2020 08/20/2019 03/02/2018 08/26/2017 02/24/2017  Decreased Interest 0 0 0 0 0  Down, Depressed, Hopeless 0 0 0 0 0  PHQ - 2 Score 0 0 0 0 0  Altered sleeping - 0 0 - -  Tired, decreased energy - 0 0 - -  Change in appetite - 0 0 - -  Feeling bad or failure about yourself  - 0 0 - -  Trouble concentrating - 0 0 - -  Moving slowly or fidgety/restless - 0 0 - -  Suicidal thoughts - 0 - - -  PHQ-9  Score - 0 0 - -     Past Medical History:  Past Medical History:  Diagnosis Date   CAD (coronary artery disease)    Colon polyps 2011   Diverticula of colon 2011   Elevated fasting glucose    GERD (gastroesophageal reflux disease)    History of hemorrhoids 2011   internal   Hyperlipidemia    Hypertension    Personal history of tobacco use, presenting hazards to health 07/27/2015   Wears dentures    full upper and lower    Surgical History:  Past Surgical History:  Procedure Laterality Date   CERVICAL FUSION  06/09/003   COLONOSCOPY     COLONOSCOPY WITH PROPOFOL N/A 07/24/2015   Procedure: COLONOSCOPY WITH PROPOFOL;  Surgeon: Lucilla Lame, MD;  Location: [place];  Service: Endoscopy;  Laterality: N/A;   CORONARY ARTERY BYPASS GRAFT  2008   [facility] - 3 vessel   POLYPECTOMY  07/24/2015   Procedure: POLYPECTOMY;  Surgeon: Lucilla Lame, MD;  Location: [place];  Service: Endoscopy;;    Medications:  Current Outpatient Medications on File Prior to Visit  Medication Sig   aspirin EC 325 MG tablet Take 325 mg by mouth daily.   Multiple Vitamins-Minerals (MULTIVITAMIN ADULT PO) Take by mouth daily.   omeprazole (PRILOSEC) 20 MG capsule Take 20 mg by mouth daily.   No current facility-administered medications on file prior to visit.    Allergies:  No Known Allergies  Social History:  Social History   Socioeconomic History  Marital status: Married    Spouse name: Not on file   Number of children: Not on file   Years of education: Not on file   Highest education level: Not on file  Occupational History   Not on file  Tobacco Use   Smoking status: Every Day    Packs/day: 1.00    Years: 40.00    Pack years: 40.00    Types: Cigarettes   Smokeless tobacco: Never  Vaping Use   Vaping Use: Never used  Substance and Sexual Activity   Alcohol use: No   Drug use: No   Sexual activity: Yes  Other Topics Concern   Not on file  Social History Narrative    Not on file   Social Determinants of Health   Financial Resource Strain: Not on file  Food Insecurity: Not on file  Transportation Needs: Not on file  Physical Activity: Not on file  Stress: Not on file  Social Connections: Not on file  Intimate Partner Violence: Not on file   Social History   Tobacco Use  Smoking Status Every Day   Packs/day: 1.00   Years: 40.00   Pack years: 40.00   Types: Cigarettes  Smokeless Tobacco Never   Social History   Substance and Sexual Activity  Alcohol Use No    Family History:  Family History  Problem Relation Age of Onset   Hypertension Mother    Heart disease Mother        CAD   Cancer Father        colon, prostate   Stroke Father    Parkinson's disease Father     Past medical history, surgical history, medications, allergies, family history and social history reviewed with patient today and changes made to appropriate areas of the chart.   Review of Systems  Constitutional: Negative.   HENT: Negative.    Eyes: Negative.   Respiratory: Negative.    Cardiovascular: Negative.   Gastrointestinal: Negative.   Genitourinary: Negative.   Musculoskeletal: Negative.   Skin: Negative.   Neurological: Negative.   Endo/Heme/Allergies: Negative.   Psychiatric/Behavioral: Negative.    All other ROS negative except what is listed above and in the HPI.      Objective:    BP 132/72   Pulse 80   Temp 97.8 F (36.6 C) (Oral)   Ht 5' 11.8" (1.824 m)   Wt 179 lb 3.2 oz (81.3 kg)   SpO2 98%   BMI 24.44 kg/m   Wt Readings from Last 3 Encounters:  08/23/20 179 lb 3.2 oz (81.3 kg)  02/21/20 189 lb 4 oz (85.8 kg)  08/30/19 182 lb 14.4 oz (83 kg)    Physical Exam Vitals and nursing note reviewed.  Constitutional:      General: He is not in acute distress.    Appearance: Normal appearance. He is normal weight. He is not ill-appearing, toxic-appearing or diaphoretic.  HENT:     Head: Normocephalic and atraumatic.     Right Ear:  Tympanic membrane, ear canal and external ear normal. There is no impacted cerumen.     Left Ear: Tympanic membrane, ear canal and external ear normal. There is no impacted cerumen.     Nose: Nose normal. No congestion or rhinorrhea.     Mouth/Throat:     Mouth: Mucous membranes are moist.     Pharynx: Oropharynx is clear. No oropharyngeal exudate or posterior oropharyngeal erythema.  Eyes:     General: No scleral icterus.  Right eye: No discharge.        Left eye: No discharge.     Extraocular Movements: Extraocular movements intact.     Conjunctiva/sclera: Conjunctivae normal.     Pupils: Pupils are equal, round, and reactive to light.  Neck:     Vascular: No carotid bruit.  Cardiovascular:     Rate and Rhythm: Normal rate and regular rhythm.     Pulses: Normal pulses.     Heart sounds: No murmur heard.   No friction rub. No gallop.  Pulmonary:     Effort: Pulmonary effort is normal. No respiratory distress.     Breath sounds: Normal breath sounds. No stridor. No wheezing, rhonchi or rales.  Chest:     Chest wall: No tenderness.  Abdominal:     General: Abdomen is flat. Bowel sounds are normal. There is no distension.     Palpations: Abdomen is soft. There is no mass.     Tenderness: There is no abdominal tenderness. There is no right CVA tenderness, left CVA tenderness, guarding or rebound.     Hernia: No hernia is present.  Genitourinary:    Comments: Genital exam deferred with shared decision making Musculoskeletal:        General: No swelling, tenderness, deformity or signs of injury.     Cervical back: Normal range of motion and neck supple. No rigidity. No muscular tenderness.     Right lower leg: No edema.     Left lower leg: No edema.  Lymphadenopathy:     Cervical: No cervical adenopathy.  Skin:    General: Skin is warm and dry.     Capillary Refill: Capillary refill takes less than 2 seconds.     Coloration: Skin is not jaundiced or pale.     Findings: No  bruising, erythema, lesion or rash.  Neurological:     General: No focal deficit present.     Mental Status: He is alert and oriented to person, place, and time.     Cranial Nerves: No cranial nerve deficit.     Sensory: No sensory deficit.     Motor: No weakness.     Coordination: Coordination normal.     Gait: Gait normal.     Deep Tendon Reflexes: Reflexes normal.  Psychiatric:        Mood and Affect: Mood normal.        Behavior: Behavior normal.        Thought Content: Thought content normal.        Judgment: Judgment normal.    Results for orders placed or performed in visit on 02/21/20  CBC with Differential/Platelet  Result Value Ref Range   WBC 8.2 3.4 - 10.8 x10E3/uL   RBC 5.39 4.14 - 5.80 x10E6/uL   Hemoglobin 16.7 13.0 - 17.7 g/dL   Hematocrit 50.0 37.5 - 51.0 %   MCV 93 79 - 97 fL   MCH 31.0 26.6 - 33.0 pg   MCHC 33.4 31.5 - 35.7 g/dL   RDW 13.7 11.6 - 15.4 %   Platelets 138 (L) 150 - 450 x10E3/uL   Neutrophils 69 Not Estab. %   Lymphs 21 Not Estab. %   Monocytes 9 Not Estab. %   Eos 1 Not Estab. %   Basos 0 Not Estab. %   Neutrophils Absolute 5.6 1.4 - 7.0 x10E3/uL   Lymphocytes Absolute 1.7 0.7 - 3.1 x10E3/uL   Monocytes Absolute 0.8 0.1 - 0.9 x10E3/uL   EOS (ABSOLUTE) 0.1 0.0 -  0.4 x10E3/uL   Basophils Absolute 0.0 0.0 - 0.2 x10E3/uL   Immature Granulocytes 0 Not Estab. %   Immature Grans (Abs) 0.0 0.0 - 0.1 x10E3/uL  Bayer DCA Hb A1c Waived  Result Value Ref Range   HB A1C (BAYER DCA - WAIVED) 6.7 <7.0 %  Comprehensive metabolic panel  Result Value Ref Range   Glucose 209 (H) 65 - 99 mg/dL   BUN 12 8 - 27 mg/dL   Creatinine, Ser 1.04 0.76 - 1.27 mg/dL   GFR calc non Af Amer 77 >59 mL/min/1.73   GFR calc Af Amer 89 >59 mL/min/1.73   BUN/Creatinine Ratio 12 10 - 24   Sodium 138 134 - 144 mmol/L   Potassium 4.8 3.5 - 5.2 mmol/L   Chloride 102 96 - 106 mmol/L   CO2 22 20 - 29 mmol/L   Calcium 9.3 8.6 - 10.2 mg/dL   Total Protein 7.1 6.0 - 8.5 g/dL    Albumin 4.6 3.8 - 4.8 g/dL   Globulin, Total 2.5 1.5 - 4.5 g/dL   Albumin/Globulin Ratio 1.8 1.2 - 2.2   Bilirubin Total 0.5 0.0 - 1.2 mg/dL   Alkaline Phosphatase 88 44 - 121 IU/L   AST 32 0 - 40 IU/L   ALT 41 0 - 44 IU/L  Lipid Panel w/o Chol/HDL Ratio  Result Value Ref Range   Cholesterol, Total 144 100 - 199 mg/dL   Triglycerides 266 (H) 0 - 149 mg/dL   HDL 41 >39 mg/dL   VLDL Cholesterol Cal 42 (H) 5 - 40 mg/dL   LDL Chol Calc (NIH) 61 0 - 99 mg/dL  PSA  Result Value Ref Range   Prostate Specific Ag, Serum 3.3 0.0 - 4.0 ng/mL      Assessment & Plan:   Problem List Items Addressed This Visit       Cardiovascular and Mediastinum   Essential hypertension    Under good control on current regimen. Continue current regimen. Continue to monitor. Call with any concerns. Refills given. Labs drawn today.         Relevant Medications   colesevelam (WELCHOL) 625 MG tablet   benazepril (LOTENSIN) 40 MG tablet   metoprolol succinate (TOPROL-XL) 100 MG 24 hr tablet   omega-3 acid ethyl esters (LOVAZA) 1 g capsule   rosuvastatin (CRESTOR) 40 MG tablet   Other Relevant Orders   Comprehensive metabolic panel   CBC with Differential/Platelet   TSH   Urinalysis, Routine w reflex microscopic   Microalbumin, Urine Waived   Aortic atherosclerosis (HCC)    Will keep BP, cholesterol and sugars under good control. Continue to monitor. Call with any concerns.        Relevant Medications   colesevelam (WELCHOL) 625 MG tablet   benazepril (LOTENSIN) 40 MG tablet   metoprolol succinate (TOPROL-XL) 100 MG 24 hr tablet   omega-3 acid ethyl esters (LOVAZA) 1 g capsule   rosuvastatin (CRESTOR) 40 MG tablet   Other Relevant Orders   Comprehensive metabolic panel   CBC with Differential/Platelet   Lipid Panel w/o Chol/HDL Ratio   TSH   Urinalysis, Routine w reflex microscopic     Respiratory   COPD (chronic obstructive pulmonary disease) with emphysema (HCC)    Under good control  on current regimen. Continue current regimen. Continue to monitor. Call with any concerns. Refills given. Labs drawn today.         Relevant Orders   Comprehensive metabolic panel   CBC with Differential/Platelet   TSH  Urinalysis, Routine w reflex microscopic     Digestive   GERD (gastroesophageal reflux disease)    Under good control on current regimen. Continue current regimen. Continue to monitor. Call with any concerns. Refills given. Labs drawn today.         Relevant Orders   Comprehensive metabolic panel   CBC with Differential/Platelet   TSH   Urinalysis, Routine w reflex microscopic     Endocrine   Controlled type 2 diabetes mellitus without complication, without long-term current use of insulin (HCC)    Significantly worse with A1c of 11.2 up from 6.7- will confirm with send out and increase his metformin to '1000mg'$  BID. Recheck 3 months. Call with any concerns.        Relevant Medications   benazepril (LOTENSIN) 40 MG tablet   metFORMIN (GLUCOPHAGE) 500 MG tablet   rosuvastatin (CRESTOR) 40 MG tablet   Other Relevant Orders   Comprehensive metabolic panel   CBC with Differential/Platelet   TSH   Urinalysis, Routine w reflex microscopic   Microalbumin, Urine Waived   Bayer DCA Hb A1c Waived   Hgb A1c w/o eAG     Genitourinary   Benign prostatic hyperplasia with lower urinary tract symptoms    Under good control on current regimen. Continue current regimen. Continue to monitor. Call with any concerns. Refills given. Labs drawn today.         Relevant Medications   tamsulosin (FLOMAX) 0.4 MG CAPS capsule   Other Relevant Orders   Comprehensive metabolic panel   CBC with Differential/Platelet   PSA   TSH   Urinalysis, Routine w reflex microscopic     Other   Hyperlipidemia    Under good control on current regimen. Continue current regimen. Continue to monitor. Call with any concerns. Refills given. Labs drawn today.         Relevant  Medications   colesevelam (WELCHOL) 625 MG tablet   benazepril (LOTENSIN) 40 MG tablet   metoprolol succinate (TOPROL-XL) 100 MG 24 hr tablet   omega-3 acid ethyl esters (LOVAZA) 1 g capsule   rosuvastatin (CRESTOR) 40 MG tablet   Other Relevant Orders   Comprehensive metabolic panel   CBC with Differential/Platelet   Lipid Panel w/o Chol/HDL Ratio   TSH   Urinalysis, Routine w reflex microscopic   Other Visit Diagnoses     Routine general medical examination at a health care facility    -  Primary   Vaccines up to date. Screening labs checked today. Colonoscopy ordered. Continue diet and exercise. Call wiht any concerns. Continue to monitor.    Screening for colon cancer       Referral to GI made today.   Relevant Orders   Ambulatory referral to Gastroenterology   Ambulatory referral to Gastroenterology        LABORATORY TESTING:  Health maintenance labs ordered today as discussed above.   The natural history of prostate cancer and ongoing controversy regarding screening and potential treatment outcomes of prostate cancer has been discussed with the patient. The meaning of a false positive PSA and a false negative PSA has been discussed. He indicates understanding of the limitations of this screening test and wishes to proceed with screening PSA testing.   IMMUNIZATIONS:   - Tdap: Tetanus vaccination status reviewed: last tetanus booster within 10 years. - Influenza: Postponed to flu season - Pneumovax: Administered today - Prevnar: Not applicable - COVID: Up to date  SCREENING: - Colonoscopy: Ordered today  Discussed with patient  purpose of the colonoscopy is to detect colon cancer at curable precancerous or early stages   PATIENT COUNSELING:    Sexuality: Discussed sexually transmitted diseases, partner selection, use of condoms, avoidance of unintended pregnancy  and contraceptive alternatives.   Advised to avoid cigarette smoking.  I discussed with the patient  that most people either abstain from alcohol or drink within safe limits (<=14/week and <=4 drinks/occasion for males, <=7/weeks and <= 3 drinks/occasion for females) and that the risk for alcohol disorders and other health effects rises proportionally with the number of drinks per week and how often a drinker exceeds daily limits.  Discussed cessation/primary prevention of drug use and availability of treatment for abuse.   Diet: Encouraged to adjust caloric intake to maintain  or achieve ideal body weight, to reduce intake of dietary saturated fat and total fat, to limit sodium intake by avoiding high sodium foods and not adding table salt, and to maintain adequate dietary potassium and calcium preferably from fresh fruits, vegetables, and low-fat dairy products.    stressed the importance of regular exercise  Injury prevention: Discussed safety belts, safety helmets, smoke detector, smoking near bedding or upholstery.   Dental health: Discussed importance of regular tooth brushing, flossing, and dental visits.   Follow up plan: NEXT PREVENTATIVE PHYSICAL DUE IN 1 YEAR. Return in about 3 months (around 11/23/2020).

## 2020-08-23 NOTE — Assessment & Plan Note (Signed)
Will keep BP, cholesterol and sugars under good control. Continue to monitor. Call with any concerns.  

## 2020-08-24 LAB — CBC WITH DIFFERENTIAL/PLATELET
Basophils Absolute: 0 10*3/uL (ref 0.0–0.2)
Basos: 1 %
EOS (ABSOLUTE): 0.1 10*3/uL (ref 0.0–0.4)
Eos: 1 %
Hematocrit: 52.9 % — ABNORMAL HIGH (ref 37.5–51.0)
Hemoglobin: 17.7 g/dL (ref 13.0–17.7)
Immature Grans (Abs): 0 10*3/uL (ref 0.0–0.1)
Immature Granulocytes: 0 %
Lymphocytes Absolute: 1.9 10*3/uL (ref 0.7–3.1)
Lymphs: 22 %
MCH: 31.6 pg (ref 26.6–33.0)
MCHC: 33.5 g/dL (ref 31.5–35.7)
MCV: 95 fL (ref 79–97)
Monocytes Absolute: 0.8 10*3/uL (ref 0.1–0.9)
Monocytes: 10 %
Neutrophils Absolute: 5.6 10*3/uL (ref 1.4–7.0)
Neutrophils: 66 %
Platelets: 146 10*3/uL — ABNORMAL LOW (ref 150–450)
RBC: 5.6 x10E6/uL (ref 4.14–5.80)
RDW: 13.1 % (ref 11.6–15.4)
WBC: 8.4 10*3/uL (ref 3.4–10.8)

## 2020-08-24 LAB — COMPREHENSIVE METABOLIC PANEL
ALT: 37 IU/L (ref 0–44)
AST: 28 IU/L (ref 0–40)
Albumin/Globulin Ratio: 1.8 (ref 1.2–2.2)
Albumin: 4.6 g/dL (ref 3.8–4.8)
Alkaline Phosphatase: 109 IU/L (ref 44–121)
BUN/Creatinine Ratio: 10 (ref 10–24)
BUN: 10 mg/dL (ref 8–27)
Bilirubin Total: 0.6 mg/dL (ref 0.0–1.2)
CO2: 23 mmol/L (ref 20–29)
Calcium: 9.5 mg/dL (ref 8.6–10.2)
Chloride: 95 mmol/L — ABNORMAL LOW (ref 96–106)
Creatinine, Ser: 1.04 mg/dL (ref 0.76–1.27)
Globulin, Total: 2.5 g/dL (ref 1.5–4.5)
Glucose: 392 mg/dL — ABNORMAL HIGH (ref 65–99)
Potassium: 3.7 mmol/L (ref 3.5–5.2)
Sodium: 137 mmol/L (ref 134–144)
Total Protein: 7.1 g/dL (ref 6.0–8.5)
eGFR: 81 mL/min/{1.73_m2} (ref >59)

## 2020-08-24 LAB — LIPID PANEL W/O CHOL/HDL RATIO
Cholesterol, Total: 154 mg/dL (ref 100–199)
HDL: 35 mg/dL — ABNORMAL LOW (ref >39)
LDL Chol Calc (NIH): 37 mg/dL (ref 0–99)
Triglycerides: 584 mg/dL (ref 0–149)
VLDL Cholesterol Cal: 82 mg/dL — ABNORMAL HIGH (ref 5–40)

## 2020-08-24 LAB — TSH: TSH: 2.02 u[IU]/mL (ref 0.450–4.500)

## 2020-08-24 LAB — PSA: Prostate Specific Ag, Serum: 2.3 ng/mL (ref 0.0–4.0)

## 2020-08-24 LAB — HGB A1C W/O EAG: Hgb A1c MFr Bld: 10.6 % — ABNORMAL HIGH (ref 4.8–5.6)

## 2020-08-28 ENCOUNTER — Other Ambulatory Visit: Payer: Self-pay

## 2020-08-28 MED ORDER — CLENPIQ 10-3.5-12 MG-GM -GM/160ML PO SOLN: 320.0000 mL | ORAL | 0 refills | Status: DC

## 2020-09-14 ENCOUNTER — Encounter
Admission: RE | Disposition: A | Discharge status: Home / Self Care | Payer: Self-pay | Source: Home / Self Care | Attending: Gastroenterology

## 2020-09-14 ENCOUNTER — Encounter: Payer: Self-pay | Admitting: Gastroenterology

## 2020-09-14 ENCOUNTER — Ambulatory Visit: Payer: 59 | Admitting: Anesthesiology

## 2020-09-14 ENCOUNTER — Ambulatory Visit
Admission: RE | Admit: 2020-09-14 | Discharge: 2020-09-14 | Disposition: A | Discharge status: Home / Self Care | Payer: 59 | Attending: Gastroenterology | Admitting: Gastroenterology

## 2020-09-14 DIAGNOSIS — K635 Polyp of colon: Secondary | ICD-10-CM

## 2020-09-14 DIAGNOSIS — Z1211 Encounter for screening for malignant neoplasm of colon: Secondary | ICD-10-CM | POA: Insufficient documentation

## 2020-09-14 DIAGNOSIS — Z951 Presence of aortocoronary bypass graft: Secondary | ICD-10-CM | POA: Diagnosis not present

## 2020-09-14 DIAGNOSIS — K648 Other hemorrhoids: Secondary | ICD-10-CM | POA: Diagnosis not present

## 2020-09-14 DIAGNOSIS — I10 Essential (primary) hypertension: Secondary | ICD-10-CM | POA: Diagnosis not present

## 2020-09-14 DIAGNOSIS — Z86010 Personal history of colonic polyps: Secondary | ICD-10-CM | POA: Diagnosis not present

## 2020-09-14 DIAGNOSIS — D125 Benign neoplasm of sigmoid colon: Secondary | ICD-10-CM | POA: Diagnosis not present

## 2020-09-14 DIAGNOSIS — Z800 Family history of malignant neoplasm of digestive organs: Secondary | ICD-10-CM | POA: Diagnosis not present

## 2020-09-14 DIAGNOSIS — E785 Hyperlipidemia, unspecified: Secondary | ICD-10-CM | POA: Diagnosis not present

## 2020-09-14 DIAGNOSIS — E119 Type 2 diabetes mellitus without complications: Secondary | ICD-10-CM | POA: Diagnosis not present

## 2020-09-14 DIAGNOSIS — K5730 Diverticulosis of large intestine without perforation or abscess without bleeding: Secondary | ICD-10-CM | POA: Diagnosis not present

## 2020-09-14 DIAGNOSIS — Z79899 Other long term (current) drug therapy: Secondary | ICD-10-CM | POA: Insufficient documentation

## 2020-09-14 DIAGNOSIS — D123 Benign neoplasm of transverse colon: Secondary | ICD-10-CM | POA: Insufficient documentation

## 2020-09-14 DIAGNOSIS — F17210 Nicotine dependence, cigarettes, uncomplicated: Secondary | ICD-10-CM | POA: Insufficient documentation

## 2020-09-14 DIAGNOSIS — Z7984 Long term (current) use of oral hypoglycemic drugs: Secondary | ICD-10-CM | POA: Diagnosis not present

## 2020-09-14 DIAGNOSIS — I2510 Atherosclerotic heart disease of native coronary artery without angina pectoris: Secondary | ICD-10-CM | POA: Diagnosis not present

## 2020-09-14 DIAGNOSIS — Z8249 Family history of ischemic heart disease and other diseases of the circulatory system: Secondary | ICD-10-CM | POA: Diagnosis not present

## 2020-09-14 HISTORY — DX: Type 2 diabetes mellitus without complications: E11.9

## 2020-09-14 HISTORY — PX: COLONOSCOPY: SHX5424

## 2020-09-14 LAB — GLUCOSE, CAPILLARY: Glucose-Capillary: 189 mg/dL — ABNORMAL HIGH (ref 70–99)

## 2020-09-14 SURGERY — COLONOSCOPY
Anesthesia: General

## 2020-09-14 MED ORDER — PROPOFOL 10 MG/ML IV BOLUS
INTRAVENOUS | Status: DC | PRN
  Administered 2020-09-14: 50 mg via INTRAVENOUS

## 2020-09-14 MED ORDER — SODIUM CHLORIDE 0.9 % IV SOLN: INTRAVENOUS | Status: DC

## 2020-09-14 MED ORDER — PHENYLEPHRINE HCL (PRESSORS) 10 MG/ML IV SOLN
INTRAVENOUS | Status: DC | PRN
  Administered 2020-09-14: 50 ug via INTRAVENOUS

## 2020-09-14 MED ORDER — LIDOCAINE HCL (CARDIAC) PF 100 MG/5ML IV SOSY
PREFILLED_SYRINGE | INTRAVENOUS | Status: DC | PRN
Start: 2020-09-14 — End: 2020-09-14
  Administered 2020-09-14: 50 mg via INTRAVENOUS

## 2020-09-14 MED ORDER — PROPOFOL 500 MG/50ML IV EMUL
INTRAVENOUS | Status: AC
  Filled 2020-09-14: qty 50

## 2020-09-14 MED ORDER — PROPOFOL 500 MG/50ML IV EMUL
INTRAVENOUS | Status: DC | PRN
  Administered 2020-09-14: 150 ug/kg/min via INTRAVENOUS

## 2020-09-14 NOTE — H&P (Signed)
Lucilla Lame, MD [address] [place], [place] [address] Phone:[phone] Fax : [phone]  Primary Care Physician:  Valerie Roys, DO Primary Gastroenterologist:  Dr. Allen Norris  Pre-Procedure History & Physical: HPI:  Maxwell Young is a 65 y.o. male is here for an colonoscopy.   Past Medical History:  Diagnosis Date   CAD (coronary artery disease)    Colon polyps 01/21/2009   Diabetes mellitus without complication ([place])    Diverticula of colon 01/21/2009   Elevated fasting glucose    GERD (gastroesophageal reflux disease)    History of hemorrhoids 01/21/2009   internal   Hyperlipidemia    Hypertension    Personal history of tobacco use, presenting hazards to health 07/27/2015   Wears dentures    full upper and lower    Past Surgical History:  Procedure Laterality Date   CERVICAL FUSION  06/09/003   COLONOSCOPY     COLONOSCOPY WITH PROPOFOL N/A 07/24/2015   Procedure: COLONOSCOPY WITH PROPOFOL;  Surgeon: Lucilla Lame, MD;  Location: [facility];  Service: Endoscopy;  Laterality: N/A;   CORONARY ARTERY BYPASS GRAFT  2008   [facility] - 3 vessel   POLYPECTOMY  07/24/2015   Procedure: POLYPECTOMY;  Surgeon: Lucilla Lame, MD;  Location: [facility];  Service: Endoscopy;;    Prior to Admission medications   Medication Sig Start Date End Date Taking? Authorizing Provider  benazepril (LOTENSIN) 40 MG tablet Take 1 tablet (40 mg total) by mouth daily. 08/23/20  Yes Johnson, Megan P, DO  colesevelam (WELCHOL) 625 MG tablet Take 3 tablets (1,875 mg total) by mouth 2 (two) times daily. 08/23/20  Yes Johnson, Megan P, DO  metoprolol succinate (TOPROL-XL) 100 MG 24 hr tablet Take 1 tablet (100 mg total) by mouth daily. Take with or immediately following a meal. 08/23/20  Yes Johnson, Megan P, DO  rosuvastatin (CRESTOR) 40 MG tablet Take 1 tablet (40 mg total) by mouth daily. 08/23/20  Yes Johnson, Megan P, DO  tamsulosin (FLOMAX) 0.4 MG CAPS capsule Take 1 capsule  (0.4 mg total) by mouth daily. 08/23/20  Yes Johnson, Megan P, DO  aspirin EC 325 MG tablet Take 325 mg by mouth daily.    [provider]  metFORMIN (GLUCOPHAGE) 500 MG tablet Take 2 tablets (1,000 mg total) by mouth 2 (two) times daily with a meal. 08/23/20   Johnson, Megan P, DO  Multiple Vitamins-Minerals (MULTIVITAMIN ADULT PO) Take by mouth daily.    [provider]  omega-3 acid ethyl esters (LOVAZA) 1 g capsule Take 2 capsules (2 g total) by mouth 2 (two) times daily. 08/23/20   Johnson, Megan P, DO  omeprazole (PRILOSEC) 20 MG capsule Take 20 mg by mouth daily.    [provider]  Sod Picosulfate-Mag Ox-Cit Acd (CLENPIQ) 10-3.5-12 MG-GM -GM/160ML SOLN Take 320 mLs by mouth as directed. 08/28/20   Lucilla Lame, MD    Allergies as of 08/28/2020   (No Known Allergies)    Family History  Problem Relation Age of Onset   Hypertension Mother    Heart disease Mother        CAD   Cancer Father        colon, prostate   Stroke Father    Parkinson's disease Father     Social History   Socioeconomic History   Marital status: Married    Spouse name: Not on file   Number of children: Not on file   Years of education: Not on  file   Highest education level: Not on file  Occupational History   Not on file  Tobacco Use   Smoking status: Every Day    Packs/day: 1.00    Years: 40.00    Pack years: 40.00    Types: Cigarettes   Smokeless tobacco: Never  Vaping Use   Vaping Use: Never used  Substance and Sexual Activity   Alcohol use: No   Drug use: No   Sexual activity: Yes  Other Topics Concern   Not on file  Social History Narrative   Not on file   Social Determinants of Health   Financial Resource Strain: Not on file  Food Insecurity: Not on file  Transportation Needs: Not on file  Physical Activity: Not on file  Stress: Not on file  Social Connections: Not on file  Intimate Partner Violence: Not on file    Review of Systems: See HPI, otherwise  negative ROS  Physical Exam: BP 106/72   Pulse 99   Temp (!) 96.6 F (35.9 C) (Temporal)   Resp 18   Ht '5\' 10"'$  (1.778 m)   Wt 83.9 kg   SpO2 98%   BMI 26.54 kg/m  General:   Alert,  pleasant and cooperative in NAD Head:  Normocephalic and atraumatic. Neck:  Supple; no masses or thyromegaly. Lungs:  Clear throughout to auscultation.    Heart:  Regular rate and rhythm. Abdomen:  Soft, nontender and nondistended. Normal bowel sounds, without guarding, and without rebound.   Neurologic:  Alert and  oriented x4;  grossly normal neurologically.  Impression/Plan: Maxwell Young is here for an colonoscopy to be performed for a history of adenomatous polyps in 2017  Risks, benefits, limitations, and alternatives regarding  colonoscopy have been reviewed with the patient.  Questions have been answered.  All parties agreeable.   Lucilla Lame, MD  09/14/2020, 7:27 AM

## 2020-09-14 NOTE — Anesthesia Procedure Notes (Signed)
Procedure Name: MAC Date/Time: 09/14/2020 8:05 AM Performed by: Jerrye Noble, CRNA Pre-anesthesia Checklist: Patient identified, Emergency Drugs available, Suction available and Patient being monitored Patient Re-evaluated:Patient Re-evaluated prior to induction Oxygen Delivery Method: Nasal cannula

## 2020-09-14 NOTE — Anesthesia Postprocedure Evaluation (Signed)
Anesthesia Post Note  Patient: Maxwell Young  Procedure(s) Performed: COLONOSCOPY  Patient location during evaluation: Endoscopy Anesthesia Type: General Level of consciousness: awake and alert Pain management: pain level controlled Vital Signs Assessment: post-procedure vital signs reviewed and stable Respiratory status: spontaneous breathing, nonlabored ventilation, respiratory function stable and patient connected to nasal cannula oxygen Cardiovascular status: blood pressure returned to baseline and stable Postop Assessment: no apparent nausea or vomiting Anesthetic complications: no   No notable events documented.   Last Vitals:  Vitals:   09/14/20 0721 09/14/20 0818  BP: 106/72 (!) 79/56  Pulse: 99 71  Resp: 18 (!) 23  Temp: (!) 35.9 C 36.4 C  SpO2: 98% 95%    Last Pain:  Vitals:   09/14/20 0838  TempSrc:   PainSc: 0-No pain                 Martha Clan

## 2020-09-14 NOTE — Anesthesia Preprocedure Evaluation (Signed)
Anesthesia Evaluation  Patient identified by MRN, date of birth, ID band Patient awake    Reviewed: Allergy & Precautions, H&P , NPO status , Patient's Chart, lab work & pertinent test results, reviewed documented beta blocker date and time   History of Anesthesia Complications Negative for: history of anesthetic complications  Airway Mallampati: III  TM Distance: >3 FB Neck ROM: full    Dental  (+) Upper Dentures, Lower Dentures, Dental Advidsory Given   Pulmonary neg shortness of breath, COPD, neg recent URI, Current Smoker,    Pulmonary exam normal breath sounds clear to auscultation       Cardiovascular Exercise Tolerance: Good hypertension, (-) angina+ CAD, + CABG (3 vessels) and + Peripheral Vascular Disease  (-) Past MI and (-) Cardiac Stents Normal cardiovascular exam(-) dysrhythmias (-) Valvular Problems/Murmurs Rhythm:regular Rate:Normal     Neuro/Psych negative neurological ROS  negative psych ROS   GI/Hepatic Neg liver ROS, GERD  ,  Endo/Other  diabetes  Renal/GU negative Renal ROS  negative genitourinary   Musculoskeletal   Abdominal   Peds  Hematology negative hematology ROS (+)   Anesthesia Other Findings Past Medical History: No date: CAD (coronary artery disease) 01/21/2009: Colon polyps No date: Diabetes mellitus without complication ([place]) 123456: Diverticula of colon No date: Elevated fasting glucose No date: GERD (gastroesophageal reflux disease) 01/21/2009: History of hemorrhoids     Comment:  internal No date: Hyperlipidemia No date: Hypertension 07/27/2015: Personal history of tobacco use, presenting hazards to  health No date: Wears dentures     Comment:  full upper and lower   Reproductive/Obstetrics negative OB ROS                             Anesthesia Physical Anesthesia Plan  ASA: 3  Anesthesia Plan: General   Post-op Pain Management:     Induction: Intravenous  PONV Risk Score and Plan: 1 and TIVA and Propofol infusion  Airway Management Planned: Natural Airway and Nasal Cannula  Additional Equipment:   Intra-op Plan:   Post-operative Plan:   Informed Consent: I have reviewed the patients History and Physical, chart, labs and discussed the procedure including the risks, benefits and alternatives for the proposed anesthesia with the patient or authorized representative who has indicated his/her understanding and acceptance.     Dental Advisory Given  Plan Discussed with: Anesthesiologist, CRNA and Surgeon  Anesthesia Plan Comments:         Anesthesia Quick Evaluation

## 2020-09-14 NOTE — Op Note (Signed)
[facility] Gastroenterology Patient Name: Maxwell Young Procedure Date: 09/14/2020 7:59 AM MRN: [id number] Account #: [id number] Date of Birth: 1955-11-15 Admit Type: Outpatient Age: 65 Room: [facility] ENDO ROOM 4 Gender: Male Note Status: Finalized Procedure:             Colonoscopy Indications:           High risk colon cancer surveillance: Personal history                         of colonic polyps Providers:             Lucilla Lame MD, MD Referring MD:          Valerie Roys (Referring MD) Medicines:             Propofol per Anesthesia Complications:         No immediate complications. Procedure:             Pre-Anesthesia Assessment:                        - Prior to the procedure, a History and Physical was                         performed, and patient medications and allergies were                         reviewed. The patient's tolerance of previous                         anesthesia was also reviewed. The risks and benefits                         of the procedure and the sedation options and risks                         were discussed with the patient. All questions were                         answered, and informed consent was obtained. Prior                         Anticoagulants: The patient has taken no previous                         anticoagulant or antiplatelet agents. ASA Grade                         Assessment: II - A patient with mild systemic disease.                         After reviewing the risks and benefits, the patient                         was deemed in satisfactory condition to undergo the                         procedure.  After obtaining informed consent, the colonoscope was                         passed under direct vision. Throughout the procedure,                         the patient's blood pressure, pulse, and oxygen                         saturations were monitored continuously. The                          Colonoscope was introduced through the anus and                         advanced to the the cecum, identified by appendiceal                         orifice and ileocecal valve. The colonoscopy was                         performed without difficulty. The patient tolerated                         the procedure well. The quality of the bowel                         preparation was excellent. Findings:      The perianal and digital rectal examinations were normal.      Three sessile polyps were found in the transverse colon. The polyps were       3 to 4 mm in size. These polyps were removed with a cold snare.       Resection and retrieval were complete.      A 4 mm polyp was found in the sigmoid colon. The polyp was sessile. The       polyp was removed with a cold snare. Resection and retrieval were       complete.      Multiple small-mouthed diverticula were found in the sigmoid colon.      Non-bleeding internal hemorrhoids were found during retroflexion. The       hemorrhoids were Grade II (internal hemorrhoids that prolapse but reduce       spontaneously). Impression:            - Three 3 to 4 mm polyps in the transverse colon,                         removed with a cold snare. Resected and retrieved.                        - One 4 mm polyp in the sigmoid colon, removed with a                         cold snare. Resected and retrieved.                        - Diverticulosis in the sigmoid colon.                        -  Non-bleeding internal hemorrhoids. Recommendation:        - Discharge patient to home.                        - Resume previous diet.                        - Continue present medications.                        - Repeat colonoscopy in 5 years for surveillance. Procedure Code(s):     --- Professional ---                        [phone], Colonoscopy, flexible; with removal of                         tumor(s), polyp(s), or other lesion(s) by snare                          technique Diagnosis Code(s):     --- Professional ---                        Z86.010, Personal history of colonic polyps                        K63.5, Polyp of colon CPT copyright 2019 American Medical Association. All rights reserved. The codes documented in this report are preliminary and upon coder review may  be revised to meet current compliance requirements. Lucilla Lame MD, MD 09/14/2020 8:17:47 AM This report has been signed electronically. Number of Addenda: 0 Note Initiated On: 09/14/2020 7:59 AM Scope Withdrawal Time: 0 hours 5 minutes 20 seconds  Total Procedure Duration: 0 hours 8 minutes 46 seconds  Estimated Blood Loss:  Estimated blood loss: none.      [facility]

## 2020-09-14 NOTE — Transfer of Care (Signed)
Immediate Anesthesia Transfer of Care Note  Patient: Maxwell Young  Procedure(s) Performed: COLONOSCOPY  Patient Location: PACU  Anesthesia Type:General  Level of Consciousness: awake and patient cooperative  Airway & Oxygen Therapy: Patient Spontanous Breathing  Post-op Assessment: Report given to RN and Post -op Vital signs reviewed and stable  Post vital signs: Reviewed and stable  Last Vitals:  Vitals Value Taken Time  BP 79/56 09/14/20 0822  Temp    Pulse 83 09/14/20 0822  Resp 19 09/14/20 0822  SpO2 97 % 09/14/20 0822  Vitals shown include unvalidated device data.  Last Pain:  Vitals:   09/14/20 0721  TempSrc: Temporal  PainSc: 0-No pain         Complications: No notable events documented.

## 2020-09-15 ENCOUNTER — Encounter: Payer: Self-pay | Admitting: Gastroenterology

## 2020-09-15 LAB — SURGICAL PATHOLOGY

## 2020-09-21 ENCOUNTER — Encounter: Payer: Self-pay | Admitting: Gastroenterology

## 2020-11-23 ENCOUNTER — Ambulatory Visit (INDEPENDENT_AMBULATORY_CARE_PROVIDER_SITE_OTHER): Payer: 59 | Admitting: Family Medicine

## 2020-11-23 ENCOUNTER — Other Ambulatory Visit: Payer: Self-pay

## 2020-11-23 ENCOUNTER — Encounter: Payer: Self-pay | Admitting: Family Medicine

## 2020-11-23 VITALS — BP 119/70 | HR 81 | Temp 97.9°F | Wt 183.8 lb

## 2020-11-23 DIAGNOSIS — E119 Type 2 diabetes mellitus without complications: Secondary | ICD-10-CM | POA: Diagnosis not present

## 2020-11-23 DIAGNOSIS — Z87891 Personal history of nicotine dependence: Secondary | ICD-10-CM

## 2020-11-23 DIAGNOSIS — Z23 Encounter for immunization: Secondary | ICD-10-CM | POA: Diagnosis not present

## 2020-11-23 DIAGNOSIS — E7800 Pure hypercholesterolemia, unspecified: Secondary | ICD-10-CM | POA: Diagnosis not present

## 2020-11-23 DIAGNOSIS — I10 Essential (primary) hypertension: Secondary | ICD-10-CM | POA: Diagnosis not present

## 2020-11-23 DIAGNOSIS — S51802A Unspecified open wound of left forearm, initial encounter: Secondary | ICD-10-CM | POA: Diagnosis not present

## 2020-11-23 LAB — BAYER DCA HB A1C WAIVED: HB A1C (BAYER DCA - WAIVED): 7 % — ABNORMAL HIGH (ref 4.8–5.6)

## 2020-11-23 MED ORDER — METFORMIN HCL 500 MG PO TABS: 1000.0000 mg | ORAL_TABLET | Freq: Two times a day (BID) | ORAL | 1 refills | Status: DC

## 2020-11-23 MED ORDER — AMLODIPINE BESYLATE 10 MG PO TABS: 10.0000 mg | ORAL_TABLET | Freq: Every day | ORAL | 1 refills | Status: DC

## 2020-11-23 MED ORDER — ROSUVASTATIN CALCIUM 40 MG PO TABS: 40.0000 mg | ORAL_TABLET | Freq: Every day | ORAL | 1 refills | Status: DC

## 2020-11-23 MED ORDER — OMEGA-3-ACID ETHYL ESTERS 1 G PO CAPS: 2.0000 g | ORAL_CAPSULE | Freq: Two times a day (BID) | ORAL | 1 refills | Status: DC

## 2020-11-23 MED ORDER — METOPROLOL SUCCINATE ER 100 MG PO TB24: 100.0000 mg | ORAL_TABLET | Freq: Every day | ORAL | 1 refills | Status: DC

## 2020-11-23 NOTE — Progress Notes (Signed)
BP 119/70   Pulse 81   Temp 97.9 F (36.6 C)   Wt 183 lb 12.8 oz (83.4 kg)   SpO2 97%   BMI 26.37 kg/m    Subjective:    Patient ID: Maxwell Young, male    DOB: [birth year], 65 y.o.   MRN: [id number]  HPI: Maxwell Young is a 65 y.o. male  Chief Complaint  Patient presents with   Diabetes   DIABETES Hypoglycemic episodes:no Polydipsia/polyuria: yes Visual disturbance: yes Chest pain: no Paresthesias: no Glucose Monitoring: yes  Accucheck frequency: Not Checking Taking Insulin?: no Blood Pressure Monitoring: not checking Retinal Examination: Not up to Date Foot Exam: Up to Date Diabetic Education: Completed Pneumovax: Up to Date Influenza: Up to Date Aspirin: no  Relevant past medical, surgical, family and social history reviewed and updated as indicated. Interim medical history since our last visit reviewed. Allergies and medications reviewed and updated.  Review of Systems  Constitutional: Negative.   Respiratory: Negative.    Cardiovascular: Negative.   Gastrointestinal: Negative.   Musculoskeletal: Negative.   Psychiatric/Behavioral: Negative.     Per HPI unless specifically indicated above     Objective:    BP 119/70   Pulse 81   Temp 97.9 F (36.6 C)   Wt 183 lb 12.8 oz (83.4 kg)   SpO2 97%   BMI 26.37 kg/m   Wt Readings from Last 3 Encounters:  11/23/20 183 lb 12.8 oz (83.4 kg)  09/14/20 185 lb (83.9 kg)  08/23/20 179 lb 3.2 oz (81.3 kg)    Physical Exam Vitals and nursing note reviewed.  Constitutional:      General: He is not in acute distress.    Appearance: Normal appearance. He is not ill-appearing, toxic-appearing or diaphoretic.  HENT:     Head: Normocephalic and atraumatic.     Right Ear: External ear normal.     Left Ear: External ear normal.     Nose: Nose normal.     Mouth/Throat:     Mouth: Mucous membranes are moist.     Pharynx: Oropharynx is clear.  Eyes:     General: No scleral icterus.       Right eye: No  discharge.        Left eye: No discharge.     Extraocular Movements: Extraocular movements intact.     Conjunctiva/sclera: Conjunctivae normal.     Pupils: Pupils are equal, round, and reactive to light.  Cardiovascular:     Rate and Rhythm: Normal rate and regular rhythm.     Pulses: Normal pulses.     Heart sounds: Normal heart sounds. No murmur heard.   No friction rub. No gallop.  Pulmonary:     Effort: Pulmonary effort is normal. No respiratory distress.     Breath sounds: Normal breath sounds. No stridor. No wheezing, rhonchi or rales.  Chest:     Chest wall: No tenderness.  Musculoskeletal:        General: Normal range of motion.     Cervical back: Normal range of motion and neck supple.  Skin:    General: Skin is warm and dry.     Capillary Refill: Capillary refill takes less than 2 seconds.     Coloration: Skin is not jaundiced or pale.     Findings: No bruising, erythema, lesion or rash.     Comments: Small scratch on L forearm, healing well  Neurological:     General: No focal deficit present.  Mental Status: He is alert and oriented to person, place, and time. Mental status is at baseline.  Psychiatric:        Mood and Affect: Mood normal.        Behavior: Behavior normal.        Thought Content: Thought content normal.        Judgment: Judgment normal.    Results for orders placed or performed during the hospital encounter of 09/14/20  Glucose, capillary  Result Value Ref Range   Glucose-Capillary 189 (H) 70 - 99 mg/dL  Surgical pathology  Result Value Ref Range   SURGICAL PATHOLOGY      SURGICAL PATHOLOGY CASE: [id number] PATIENT: [place] Surgical Pathology Report     Specimen Submitted: A. Colon polyp, sigmoid; cold snare B. Colon polyp x3, transverse; cold snare  Clinical History: History of colon polyps and family history of colon cancer.  Colon polyps      DIAGNOSIS: A. COLON POLYP, SIGMOID; COLD SNARE: - TUBULAR ADENOMA. -  NEGATIVE FOR HIGH-GRADE DYSPLASIA AND MALIGNANCY.  B.  COLON POLYP X3, TRANSVERSE; COLD SNARE: - TUBULAR ADENOMA (3). - NEGATIVE FOR HIGH-GRADE DYSPLASIA AND MALIGNANCY.  GROSS DESCRIPTION: A. Labeled: Cold snare sigmoid colon polyp Received: Formalin Collection time: 8:07 AM on 09/14/2020 Placed into formalin time: 8:07 AM on 09/14/2020 Tissue fragment(s): 4 Size: Aggregate, 1.1 x 0.4 x 0.1 cm Description: Tan soft tissue fragments Entirely submitted in 1 cassette.  B. Labeled: Cold snare transverse colon polyp x3 Received: Formalin Collection time: 8:09 AM on 09/14/2020 Placed into formalin time : 8:09 AM on 09/14/2020 Tissue fragment(s): Multiple Size: Aggregate, 1.5 x 0.5 x 0.1 cm Description: Tan soft tissue fragments, admixed with intestinal debris. The ratio of soft tissue to intestinal debris is 50: 50. Entirely submitted in 1 cassette.  [facility] 09/14/2020  Final Diagnosis performed by Quay Burow, MD.   Electronically signed 09/15/2020 8:27:15AM The electronic signature indicates that the named Attending Pathologist has evaluated the specimen Technical component performed at [facility], [address], [place], [place] [address] Lab: [phone] Dir: Rush Farmer, MD, MMM  Professional component performed at [facility], [facility], [place], [place], [place] [address] Lab: [phone] Dir: Dellia Nims. Rubinas, MD       Assessment & Plan:   Problem List Items Addressed This Visit       Cardiovascular and Mediastinum   Essential hypertension   Relevant Medications   amLODipine (NORVASC) 10 MG tablet   metoprolol succinate (TOPROL-XL) 100 MG 24 hr tablet   rosuvastatin (CRESTOR) 40 MG tablet   omega-3 acid ethyl esters (LOVAZA) 1 g capsule     Endocrine   Controlled type 2 diabetes mellitus without complication, without long-term current use of insulin ([place]) - Primary    Doing much better with A1c of 7.0. Continue current regimen. Continue to  monitor. Call with any concerns.       Relevant Medications   metFORMIN (GLUCOPHAGE) 500 MG tablet   rosuvastatin (CRESTOR) 40 MG tablet   Other Relevant Orders   Bayer DCA Hb A1c Waived   Ambulatory referral to Ophthalmology     Other   Hyperlipidemia    Under good control on current regimen. Continue current regimen. Continue to monitor. Call with any concerns. Refills given.        Relevant Medications   amLODipine (NORVASC) 10 MG tablet   metoprolol succinate (TOPROL-XL) 100 MG 24 hr tablet   rosuvastatin (CRESTOR) 40 MG tablet   omega-3 acid ethyl  esters (LOVAZA) 1 g capsule   Personal history of tobacco use, presenting hazards to health    Referral for CT lung screening sent today.       Relevant Orders   Ambulatory Referral Lung Cancer Screening [facility]   Other Visit Diagnoses     Need for influenza vaccination       Relevant Orders   Flu Vaccine QUAD 6+ mos PF IM (Fluarix Quad PF) (Completed)   Open wound of left forearm, initial encounter       Relevant Orders   Td : Tetanus/diphtheria >7yo Preservative  free (Completed)        Follow up plan: Return in about 6 months (around 05/23/2021).

## 2020-11-23 NOTE — Assessment & Plan Note (Signed)
Doing much better with A1c of 7.0. Continue current regimen. Continue to monitor. Call with any concerns.

## 2020-11-23 NOTE — Assessment & Plan Note (Signed)
Under good control on current regimen. Continue current regimen. Continue to monitor. Call with any concerns. Refills given.   

## 2020-11-23 NOTE — Assessment & Plan Note (Signed)
Referral for CT lung screening sent today.

## 2021-01-02 ENCOUNTER — Encounter: Payer: Self-pay | Admitting: Family Medicine

## 2021-01-10 ENCOUNTER — Other Ambulatory Visit: Payer: Self-pay | Admitting: Family Medicine

## 2021-01-10 DIAGNOSIS — I10 Essential (primary) hypertension: Secondary | ICD-10-CM

## 2021-01-10 NOTE — Telephone Encounter (Signed)
Requested medications are due for refill today.  no  Requested medications are on the active medications list.  no  Last refill. 08/23/2020  Future visit scheduled.   yes  Notes to clinic.  This medication was d/c'd on 11/23/2020.    Requested Prescriptions  Pending Prescriptions Disp Refills   benazepril (LOTENSIN) 40 MG tablet [Pharmacy Med Name: Benazepril HCl 40 MG Oral Tablet] 90 tablet 3    Sig: TAKE 1 TABLET BY MOUTH  DAILY     Cardiovascular:  ACE Inhibitors Passed - 01/10/2021 10:11 PM      Passed - Cr in normal range and within 180 days    Creatinine, Ser  Date Value Ref Range Status  08/23/2020 1.04 0.76 - 1.27 mg/dL Final          Passed - K in normal range and within 180 days    Potassium  Date Value Ref Range Status  08/23/2020 3.7 3.5 - 5.2 mmol/L Final          Passed - Patient is not pregnant      Passed - Last BP in normal range    BP Readings from Last 1 Encounters:  11/23/20 119/70          Passed - Valid encounter within last 6 months    Recent Outpatient Visits           1 month ago Controlled type 2 diabetes mellitus without complication, without long-term current use of insulin ([place])   [place], Megan P, DO   4 months ago Routine general medical examination at a health care facility   [facility], [place], DO   10 months ago Essential hypertension   [place], [place], DO   1 year ago Annual physical exam   [facility] Eulogio Bear, NP   1 year ago Essential hypertension   [facility] [place], [place]       Future Appointments             In 4 months Wynetta Emery, Barb Merino, DO [employer], [place]

## 2021-02-28 ENCOUNTER — Other Ambulatory Visit: Payer: Self-pay

## 2021-02-28 ENCOUNTER — Other Ambulatory Visit: Payer: Self-pay | Admitting: Family Medicine

## 2021-02-28 DIAGNOSIS — F17210 Nicotine dependence, cigarettes, uncomplicated: Secondary | ICD-10-CM

## 2021-02-28 DIAGNOSIS — Z87891 Personal history of nicotine dependence: Secondary | ICD-10-CM

## 2021-02-28 NOTE — Telephone Encounter (Signed)
Medication Refill - Medication: omega-3 acid ethyl esters (LOVAZA) 1 g capsule  Has the patient contacted their pharmacy? Yes.    (Agent: If yes, when and what did the pharmacy advise?) Contact PCP office, patient states pharmacy sent in a request in December and it was denied he has been out for 3 days  Preferred Pharmacy (with phone number or street name): Patient would like a short supply sent to   [id number] - [place], [place] Phone:  [phone]  Fax:  [phone]        And please send in a 3 month supply to   [facility] ([place], [address] [place] [address], [place] [address]  Phone:  [phone]  Fax:  [phone]     Has the patient been seen for an appointment in the last year OR does the patient have an upcoming appointment? Yes.    Agent: Please be advised that RX refills may take up to 3 business days. We ask that you follow-up with your pharmacy.

## 2021-03-01 MED ORDER — OMEGA-3-ACID ETHYL ESTERS 1 G PO CAPS: 2.0000 g | ORAL_CAPSULE | Freq: Two times a day (BID) | ORAL | 0 refills | Status: DC

## 2021-03-01 NOTE — Telephone Encounter (Signed)
Patient requesting short supply sent to local pharmacy. Attempted to call patient to see if he has been able to reach [facility] regarding RF- no answer and VM not set up Requested Prescriptions  Pending Prescriptions Disp Refills   omega-3 acid ethyl esters (LOVAZA) 1 g capsule 120 capsule 0    Sig: Take 2 capsules (2 g total) by mouth 2 (two) times daily.     Endocrinology:  Nutritional Agents - omega-3 acid ethyl esters Failed - 02/28/2021  3:17 PM      Failed - Lipid Panel in normal range within the last 12 months    Cholesterol, Total  Date Value Ref Range Status  08/23/2020 154 100 - 199 mg/dL Final   Cholesterol Piccolo, Waived  Date Value Ref Range Status  08/26/2017 165 <200 mg/dL Final    Comment:                            Desirable                <200                         Borderline High      200- 239                         High                     >239    LDL Chol Calc (NIH)  Date Value Ref Range Status  08/23/2020 37 0 - 99 mg/dL Final   HDL  Date Value Ref Range Status  08/23/2020 35 (L) >39 mg/dL Final   Triglycerides  Date Value Ref Range Status  08/23/2020 584 (HH) 0 - 149 mg/dL Final   Triglycerides Piccolo,Waived  Date Value Ref Range Status  08/26/2017 412 (H) <150 mg/dL Final    Comment:                            Normal                   <150                         Borderline High     150 - 199                         High                200 - 499                         Very High                >499          Passed - Valid encounter within last 12 months    Recent Outpatient Visits          3 months ago Controlled type 2 diabetes mellitus without complication, without long-term current use of insulin ([place])   [place], Megan P, DO   6 months ago Routine general medical examination at a health care facility   [place], [place], DO   1 year ago Essential hypertension   [place]  Valerie Roys, DO   1 year ago Annual physical exam   [facility] Eulogio Bear, NP   2 years ago Essential hypertension   [facility] [place], [place]      Future Appointments            In 2 months Wynetta Emery, Barb Merino, DO [employer], [facility]

## 2021-03-14 ENCOUNTER — Ambulatory Visit
Admission: RE | Admit: 2021-03-14 | Discharge: 2021-03-14 | Disposition: A | Discharge status: Home / Self Care | Payer: 59 | Source: Ambulatory Visit | Attending: Acute Care | Admitting: Acute Care

## 2021-03-14 ENCOUNTER — Other Ambulatory Visit: Payer: Self-pay

## 2021-03-14 DIAGNOSIS — Z87891 Personal history of nicotine dependence: Secondary | ICD-10-CM | POA: Insufficient documentation

## 2021-03-14 DIAGNOSIS — F17210 Nicotine dependence, cigarettes, uncomplicated: Secondary | ICD-10-CM | POA: Insufficient documentation

## 2021-03-23 ENCOUNTER — Other Ambulatory Visit: Payer: Self-pay

## 2021-03-23 DIAGNOSIS — F17210 Nicotine dependence, cigarettes, uncomplicated: Secondary | ICD-10-CM

## 2021-03-23 DIAGNOSIS — Z87891 Personal history of nicotine dependence: Secondary | ICD-10-CM

## 2021-03-25 ENCOUNTER — Other Ambulatory Visit: Payer: Self-pay | Admitting: Family Medicine

## 2021-03-27 NOTE — Telephone Encounter (Signed)
Requested Prescriptions  ?Pending Prescriptions Disp Refills  ?? omega-3 acid ethyl esters (LOVAZA) 1 g capsule [Pharmacy Med Name: OMEGA-3 ETHYL ESTERS 1 GM CAP] 120 capsule 4  ?  Sig: TAKE 2 CAPSULES BY MOUTH 2 TIMES DAILY.  ?  ? Endocrinology:  Nutritional Agents - omega-3 acid ethyl esters Failed - 03/25/2021 11:33 AM  ?  ?  Failed - Lipid Panel in normal range within the last 12 months  ?  Cholesterol, Total  ?Date Value Ref Range Status  ?08/23/2020 154 100 - 199 mg/dL Final  ? ?Cholesterol Piccolo, [place]  ?Date Value Ref Range Status  ?08/26/2017 165 <200 mg/dL Final  ?  Comment:  ?                          Desirable                <200 ?                        Borderline High      200- 239 ?                        High                     >239 ?  ? ?LDL Chol Calc (NIH)  ?Date Value Ref Range Status  ?08/23/2020 37 0 - 99 mg/dL Final  ? ?HDL  ?Date Value Ref Range Status  ?08/23/2020 35 (L) >39 mg/dL Final  ? ?Triglycerides  ?Date Value Ref Range Status  ?08/23/2020 584 (HH) 0 - 149 mg/dL Final  ? ?Triglycerides Piccolo,Waived  ?Date Value Ref Range Status  ?08/26/2017 412 (H) <150 mg/dL Final  ?  Comment:  ?                          Normal                   <150 ?                        Borderline High     150 - 199 ?                        High                200 - 499 ?                        Very High                >499 ?  ? ?  ?  ?  Passed - Valid encounter within last 12 months  ?  Recent Outpatient Visits   ?      ? 4 months ago Controlled type 2 diabetes mellitus without complication, without long-term current use of insulin ([place])  ? [place], DO  ? 7 months ago Routine general medical examination at a health care facility  ? [place], Megan P, DO  ? 1 year ago Essential hypertension  ? [place], [place], DO  ? 1 year ago Annual physical exam  ? [facility] Eulogio Bear, NP  ? 2 years ago Essential  hypertension  ? [place], [place], [place]  ?  ?  ?Future Appointments   ?        ? In 1 month [place], Barb Merino, DO [facility], [facility]  ?  ? ?  ?  ?  ? ?

## 2021-04-01 ENCOUNTER — Other Ambulatory Visit: Payer: Self-pay | Admitting: Family Medicine

## 2021-04-02 NOTE — Telephone Encounter (Signed)
Requested Prescriptions  ?Pending Prescriptions Disp Refills  ?? amLODipine (NORVASC) 10 MG tablet [Pharmacy Med Name: amLODIPine Besylate 10 MG Oral Tablet] 90 tablet 0  ?  Sig: TAKE 1 TABLET BY MOUTH DAILY  ?  ? Cardiovascular: Calcium Channel Blockers 2 Passed - 04/01/2021 10:10 PM  ?  ?  Passed - Last BP in normal range  ?  BP Readings from Last 1 Encounters:  ?11/23/20 119/70  ?   ?  ?  Passed - Last Heart Rate in normal range  ?  Pulse Readings from Last 1 Encounters:  ?11/23/20 81  ?   ?  ?  Passed - Valid encounter within last 6 months  ?  Recent Outpatient Visits   ?      ? 4 months ago Controlled type 2 diabetes mellitus without complication, without long-term current use of insulin ([place])  ? [place], DO  ? 7 months ago Routine general medical examination at a health care facility  ? [place], Megan P, DO  ? 1 year ago Essential hypertension  ? [place], Megan [place], DO  ? 1 year ago Annual physical exam  ? [facility] Eulogio Bear, NP  ? 2 years ago Essential hypertension  ? [place], [place], [place]  ?  ?  ?Future Appointments   ?        ? In 1 month Johnson, Barb Merino, DO [facility], [facility]  ?  ? ?  ?  ?  ? ?

## 2021-05-08 ENCOUNTER — Other Ambulatory Visit: Payer: Self-pay | Admitting: Family Medicine

## 2021-05-08 DIAGNOSIS — E7800 Pure hypercholesterolemia, unspecified: Secondary | ICD-10-CM

## 2021-05-09 NOTE — Telephone Encounter (Signed)
Requested Prescriptions  ?Pending Prescriptions Disp Refills  ?? rosuvastatin (CRESTOR) 40 MG tablet [Pharmacy Med Name: Rosuvastatin Calcium 40 MG Oral Tablet] 90 tablet 1  ?  Sig: TAKE 1 TABLET BY MOUTH DAILY  ?  ? Cardiovascular:  Antilipid - Statins 2 Failed - 05/08/2021 11:21 PM  ?  ?  Failed - Lipid Panel in normal range within the last 12 months  ?  Cholesterol, Total  ?Date Value Ref Range Status  ?08/23/2020 154 100 - 199 mg/dL Final  ? ?Cholesterol Piccolo, Armorel  ?Date Value Ref Range Status  ?08/26/2017 165 <200 mg/dL Final  ?  Comment:  ?                          Desirable                <200 ?                        Borderline High      200- 239 ?                        High                     >239 ?  ? ?LDL Chol Calc (NIH)  ?Date Value Ref Range Status  ?08/23/2020 37 0 - 99 mg/dL Final  ? ?HDL  ?Date Value Ref Range Status  ?08/23/2020 35 (L) >39 mg/dL Final  ? ?Triglycerides  ?Date Value Ref Range Status  ?08/23/2020 584 (HH) 0 - 149 mg/dL Final  ? ?Triglycerides Piccolo,Waived  ?Date Value Ref Range Status  ?08/26/2017 412 (H) <150 mg/dL Final  ?  Comment:  ?                          Normal                   <150 ?                        Borderline High     150 - 199 ?                        High                200 - 499 ?                        Very High                >499 ?  ? ?  ?  ?  Passed - Cr in normal range and within 360 days  ?  Creatinine, Ser  ?Date Value Ref Range Status  ?08/23/2020 1.04 0.76 - 1.27 mg/dL Final  ?   ?  ?  Passed - Patient is not pregnant  ?  ?  Passed - Valid encounter within last 12 months  ?  Recent Outpatient Visits   ?      ? 5 months ago Controlled type 2 diabetes mellitus without complication, without long-term current use of insulin ([place])  ? [place], DO  ? 8 months ago Routine general medical examination at a health care facility  ? Kemp, May[place],  DO  ? 1 year ago Essential hypertension  ? Blanchard, Megan [place], DO  ? 1 year ago Annual physical exam  ? [facility] Eulogio Bear, NP  ? 2 years ago Essential hypertension  ? [place], [place], [place]  ?  ?  ?Future Appointments   ?        ? In 2 weeks Wynetta Emery, Barb Merino, DO [place], [facility]  ?  ? ?  ?  ?  ? ? ?

## 2021-05-23 ENCOUNTER — Encounter: Payer: Self-pay | Admitting: Family Medicine

## 2021-05-23 ENCOUNTER — Ambulatory Visit (INDEPENDENT_AMBULATORY_CARE_PROVIDER_SITE_OTHER): Payer: 59 | Admitting: Family Medicine

## 2021-05-23 VITALS — BP 120/74 | HR 82 | Temp 98.0°F | Wt 178.8 lb

## 2021-05-23 DIAGNOSIS — E7800 Pure hypercholesterolemia, unspecified: Secondary | ICD-10-CM

## 2021-05-23 DIAGNOSIS — I739 Peripheral vascular disease, unspecified: Secondary | ICD-10-CM

## 2021-05-23 DIAGNOSIS — I2510 Atherosclerotic heart disease of native coronary artery without angina pectoris: Secondary | ICD-10-CM

## 2021-05-23 DIAGNOSIS — I700 Atherosclerosis of aorta: Secondary | ICD-10-CM

## 2021-05-23 DIAGNOSIS — N401 Enlarged prostate with lower urinary tract symptoms: Secondary | ICD-10-CM

## 2021-05-23 DIAGNOSIS — E119 Type 2 diabetes mellitus without complications: Secondary | ICD-10-CM | POA: Diagnosis not present

## 2021-05-23 DIAGNOSIS — I10 Essential (primary) hypertension: Secondary | ICD-10-CM | POA: Diagnosis not present

## 2021-05-23 LAB — BAYER DCA HB A1C WAIVED: HB A1C (BAYER DCA - WAIVED): 7.6 % — ABNORMAL HIGH (ref 4.8–5.6)

## 2021-05-23 MED ORDER — COLESEVELAM HCL 625 MG PO TABS: 1875.0000 mg | ORAL_TABLET | Freq: Two times a day (BID) | ORAL | 3 refills | Status: DC

## 2021-05-23 MED ORDER — OMEGA-3-ACID ETHYL ESTERS 1 G PO CAPS: ORAL_CAPSULE | ORAL | 1 refills | Status: DC

## 2021-05-23 MED ORDER — AMLODIPINE BESYLATE 10 MG PO TABS: 10.0000 mg | ORAL_TABLET | Freq: Every day | ORAL | 1 refills | Status: DC

## 2021-05-23 MED ORDER — METOPROLOL SUCCINATE ER 100 MG PO TB24: 100.0000 mg | ORAL_TABLET | Freq: Every day | ORAL | 1 refills | Status: DC

## 2021-05-23 MED ORDER — TAMSULOSIN HCL 0.4 MG PO CAPS: 0.4000 mg | ORAL_CAPSULE | Freq: Every day | ORAL | 3 refills | Status: DC

## 2021-05-23 MED ORDER — METFORMIN HCL 500 MG PO TABS: 1000.0000 mg | ORAL_TABLET | Freq: Two times a day (BID) | ORAL | 1 refills | Status: DC

## 2021-05-23 NOTE — Assessment & Plan Note (Signed)
Under good control on current regimen. Continue current regimen. Continue to monitor. Call with any concerns. Refills given. Labs drawn today.   

## 2021-05-23 NOTE — Assessment & Plan Note (Signed)
Doing OK with A1c of 7.6 up from 7.0. Will continue current regimen and recheck in 3 months. Call with any concerns. Continue to monitor.  ?

## 2021-05-23 NOTE — Assessment & Plan Note (Signed)
Will keep BP and cholesterol under good control. Continue to monitor. Call with any concerns.  

## 2021-05-23 NOTE — Progress Notes (Signed)
? ?BP 120/74   Pulse 82   Temp 98 ?F (36.7 ?C)   Wt 178 lb 12.8 oz (81.1 kg)   SpO2 98%   BMI 24.25 kg/m?   ? ?Subjective:  ? ? Patient ID: Maxwell Young, male    DOB: [birth year], 66 y.o.   MRN: [id number] ? ?HPI: ?Maxwell Young is a 66 y.o. male ? ?Chief Complaint  ?Patient presents with  ? Diabetes  ?  Patient has not had eye exam in the past year   ? Hyperlipidemia  ? Hypertension  ? ?DIABETES ?Hypoglycemic episodes:no ?Polydipsia/polyuria: yes ?Visual disturbance: no ?Chest pain: no ?Paresthesias: no ?Glucose Monitoring: no ? Accucheck frequency: Not Checking ?Taking Insulin?: no ?Blood Pressure Monitoring: not checking ?Retinal Examination:Not Up to Date ?Foot Exam: Up to Date ?Diabetic Education: Completed ?Pneumovax: Up to Date ?Influenza: Up to Date ?Aspirin: yes ? ?HYPERTENSION / HYPERLIPIDEMIA ?Satisfied with current treatment? yes ?Duration of hypertension: chronic ?BP monitoring frequency: not checking ?BP medication side effects: no ?Past BP meds: metoprolol, amlodipine ?Duration of hyperlipidemia: chronic ?Cholesterol medication side effects: no ?Cholesterol supplements: none ?Past cholesterol medications: lovaza, welchol, crestor ?Medication compliance: excellent compliance ?Aspirin: yes ?Recent stressors: no ?Recurrent headaches: no ?Visual changes: no ?Palpitations: no ?Dyspnea: no ?Chest pain: no ?Lower extremity edema: no ?Dizzy/lightheaded: no ? ?Relevant past medical, surgical, family and social history reviewed and updated as indicated. Interim medical history since our last visit reviewed. ?Allergies and medications reviewed and updated. ? ?Review of Systems  ?Constitutional: Negative.   ?Respiratory: Negative.    ?Cardiovascular: Negative.   ?Gastrointestinal: Negative.   ?Musculoskeletal: Negative.   ?Psychiatric/Behavioral: Negative.    ? ?Per HPI unless specifically indicated above ? ?   ?Objective:  ?  ?BP 120/74   Pulse 82   Temp 98 ?F (36.7 ?C)   Wt 178 lb 12.8 oz (81.1 kg)    SpO2 98%   BMI 24.25 kg/m?   ?Wt Readings from Last 3 Encounters:  ?05/23/21 178 lb 12.8 oz (81.1 kg)  ?03/14/21 184 lb (83.5 kg)  ?11/23/20 183 lb 12.8 oz (83.4 kg)  ?  ?Physical Exam ?Vitals and nursing note reviewed.  ?Constitutional:   ?   General: He is not in acute distress. ?   Appearance: Normal appearance. He is not ill-appearing, toxic-appearing or diaphoretic.  ?HENT:  ?   Head: Normocephalic and atraumatic.  ?   Right Ear: External ear normal.  ?   Left Ear: External ear normal.  ?   Nose: Nose normal.  ?   Mouth/Throat:  ?   Mouth: Mucous membranes are moist.  ?   Pharynx: Oropharynx is clear.  ?Eyes:  ?   General: No scleral icterus.    ?   Right eye: No discharge.     ?   Left eye: No discharge.  ?   Extraocular Movements: Extraocular movements intact.  ?   Conjunctiva/sclera: Conjunctivae normal.  ?   Pupils: Pupils are equal, round, and reactive to light.  ?Cardiovascular:  ?   Rate and Rhythm: Normal rate and regular rhythm.  ?   Pulses: Normal pulses.  ?   Heart sounds: Normal heart sounds. No murmur heard. ?  No friction rub. No gallop.  ?Pulmonary:  ?   Effort: Pulmonary effort is normal. No respiratory distress.  ?   Breath sounds: Normal breath sounds. No stridor. No wheezing, rhonchi or rales.  ?Chest:  ?   Chest wall: No tenderness.  ?Musculoskeletal:     ?  General: Normal range of motion.  ?   Cervical back: Normal range of motion and neck supple.  ?Skin: ?   General: Skin is warm and dry.  ?   Capillary Refill: Capillary refill takes less than 2 seconds.  ?   Coloration: Skin is not jaundiced or pale.  ?   Findings: No bruising, erythema, lesion or rash.  ?Neurological:  ?   General: No focal deficit present.  ?   Mental Status: He is alert and oriented to person, place, and time. Mental status is at baseline.  ?Psychiatric:     ?   Mood and Affect: Mood normal.     ?   Behavior: Behavior normal.     ?   Thought Content: Thought content normal.     ?   Judgment: Judgment normal.   ? ? ?Results for orders placed or performed in visit on 05/23/21  ?Bayer DCA Hb A1c Waived  ?Result Value Ref Range  ? HB A1C (BAYER DCA - WAIVED) 7.6 (H) 4.8 - 5.6 %  ? ?   ?Assessment & Plan:  ? ?Problem List Items Addressed This Visit   ? ?  ? Cardiovascular and Mediastinum  ? Essential hypertension  ?  Under good control on current regimen. Continue current regimen. Continue to monitor. Call with any concerns. Refills given. Labs drawn today. ? ? ?  ?  ? Relevant Medications  ? omega-3 acid ethyl esters (LOVAZA) 1 g capsule  ? metoprolol succinate (TOPROL-XL) 100 MG 24 hr tablet  ? colesevelam (WELCHOL) 625 MG tablet  ? amLODipine (NORVASC) 10 MG tablet  ? Other Relevant Orders  ? Comprehensive metabolic panel  ? CBC with Differential/Platelet  ? CAD (coronary artery disease)  ?  Will keep BP and cholesterol under good control. Continue to monitor. Call with any concerns.  ? ?  ?  ? Relevant Medications  ? omega-3 acid ethyl esters (LOVAZA) 1 g capsule  ? metoprolol succinate (TOPROL-XL) 100 MG 24 hr tablet  ? colesevelam (WELCHOL) 625 MG tablet  ? amLODipine (NORVASC) 10 MG tablet  ? Aortic atherosclerosis ([place])  ?  Will keep BP and cholesterol under good control. Continue to monitor. Call with any concerns.  ? ?  ?  ? Relevant Medications  ? omega-3 acid ethyl esters (LOVAZA) 1 g capsule  ? metoprolol succinate (TOPROL-XL) 100 MG 24 hr tablet  ? colesevelam (WELCHOL) 625 MG tablet  ? amLODipine (NORVASC) 10 MG tablet  ? PAD (peripheral artery disease) ([place])  ?  Will keep BP and cholesterol under good control. Continue to monitor. Call with any concerns.  ? ?  ?  ? Relevant Medications  ? omega-3 acid ethyl esters (LOVAZA) 1 g capsule  ? metoprolol succinate (TOPROL-XL) 100 MG 24 hr tablet  ? colesevelam (WELCHOL) 625 MG tablet  ? amLODipine (NORVASC) 10 MG tablet  ?  ? Endocrine  ? Controlled type 2 diabetes mellitus without complication, without long-term current use of insulin ([place]) - Primary  ?  Doing OK  with A1c of 7.6 up from 7.0. Will continue current regimen and recheck in 3 months. Call with any concerns. Continue to monitor.  ? ?  ?  ? Relevant Medications  ? metFORMIN (GLUCOPHAGE) 500 MG tablet  ? Other Relevant Orders  ? Comprehensive metabolic panel  ? CBC with Differential/Platelet  ? Bayer DCA Hb A1c Waived (Completed)  ?  ? Genitourinary  ? Benign prostatic hyperplasia with lower urinary tract symptoms  ?  Under good control on current regimen. Continue current regimen. Continue to monitor. Call with any concerns. Refills given. Labs drawn today.  ? ? ?  ?  ? Relevant Medications  ? tamsulosin (FLOMAX) 0.4 MG CAPS capsule  ?  ? Other  ? Hyperlipidemia  ?  Under good control on current regimen. Continue current regimen. Continue to monitor. Call with any concerns. Refills given. Labs drawn today. ? ? ?  ?  ? Relevant Medications  ? omega-3 acid ethyl esters (LOVAZA) 1 g capsule  ? metoprolol succinate (TOPROL-XL) 100 MG 24 hr tablet  ? colesevelam (WELCHOL) 625 MG tablet  ? amLODipine (NORVASC) 10 MG tablet  ? Other Relevant Orders  ? Comprehensive metabolic panel  ? CBC with Differential/Platelet  ? Lipid Panel w/o Chol/HDL Ratio  ?  ? ?Follow up plan: ?Return in about 3 months (around 08/23/2021). ? ? ? ? ? ?

## 2021-05-24 LAB — CBC WITH DIFFERENTIAL/PLATELET
Basophils Absolute: 0.1 10*3/uL (ref 0.0–0.2)
Basos: 1 %
EOS (ABSOLUTE): 0.1 10*3/uL (ref 0.0–0.4)
Eos: 1 %
Hematocrit: 50.5 % (ref 37.5–51.0)
Hemoglobin: 17.1 g/dL (ref 13.0–17.7)
Immature Grans (Abs): 0 10*3/uL (ref 0.0–0.1)
Immature Granulocytes: 0 %
Lymphocytes Absolute: 2.2 10*3/uL (ref 0.7–3.1)
Lymphs: 24 %
MCH: 30.9 pg (ref 26.6–33.0)
MCHC: 33.9 g/dL (ref 31.5–35.7)
MCV: 91 fL (ref 79–97)
Monocytes Absolute: 0.9 10*3/uL (ref 0.1–0.9)
Monocytes: 10 %
Neutrophils Absolute: 5.7 10*3/uL (ref 1.4–7.0)
Neutrophils: 64 %
Platelets: 196 10*3/uL (ref 150–450)
RBC: 5.54 x10E6/uL (ref 4.14–5.80)
RDW: 13.6 % (ref 11.6–15.4)
WBC: 9 10*3/uL (ref 3.4–10.8)

## 2021-05-24 LAB — COMPREHENSIVE METABOLIC PANEL
ALT: 29 IU/L (ref 0–44)
AST: 22 IU/L (ref 0–40)
Albumin/Globulin Ratio: 1.8 (ref 1.2–2.2)
Albumin: 4.5 g/dL (ref 3.8–4.8)
Alkaline Phosphatase: 79 IU/L (ref 44–121)
BUN/Creatinine Ratio: 14 (ref 10–24)
BUN: 13 mg/dL (ref 8–27)
Bilirubin Total: 0.3 mg/dL (ref 0.0–1.2)
CO2: 23 mmol/L (ref 20–29)
Calcium: 9.8 mg/dL (ref 8.6–10.2)
Chloride: 101 mmol/L (ref 96–106)
Creatinine, Ser: 0.95 mg/dL (ref 0.76–1.27)
Globulin, Total: 2.5 g/dL (ref 1.5–4.5)
Glucose: 180 mg/dL — ABNORMAL HIGH (ref 70–99)
Potassium: 4.4 mmol/L (ref 3.5–5.2)
Sodium: 141 mmol/L (ref 134–144)
Total Protein: 7 g/dL (ref 6.0–8.5)
eGFR: 89 mL/min/{1.73_m2} (ref >59)

## 2021-05-24 LAB — LIPID PANEL W/O CHOL/HDL RATIO
Cholesterol, Total: 123 mg/dL (ref 100–199)
HDL: 44 mg/dL (ref >39)
LDL Chol Calc (NIH): 34 mg/dL (ref 0–99)
Triglycerides: 308 mg/dL — ABNORMAL HIGH (ref 0–149)
VLDL Cholesterol Cal: 45 mg/dL — ABNORMAL HIGH (ref 5–40)

## 2021-08-24 ENCOUNTER — Ambulatory Visit: Payer: 59 | Admitting: Family Medicine

## 2021-08-31 ENCOUNTER — Ambulatory Visit (INDEPENDENT_AMBULATORY_CARE_PROVIDER_SITE_OTHER): Payer: 59 | Admitting: Family Medicine

## 2021-08-31 ENCOUNTER — Encounter: Payer: Self-pay | Admitting: Family Medicine

## 2021-08-31 VITALS — BP 127/74 | HR 81 | Temp 98.2°F | Wt 175.3 lb

## 2021-08-31 DIAGNOSIS — E1151 Type 2 diabetes mellitus with diabetic peripheral angiopathy without gangrene: Secondary | ICD-10-CM

## 2021-08-31 DIAGNOSIS — R1310 Dysphagia, unspecified: Secondary | ICD-10-CM | POA: Diagnosis not present

## 2021-08-31 LAB — MICROALBUMIN, URINE WAIVED
Creatinine, Urine Waived: 300 mg/dL (ref 10–300)
Microalb, Ur Waived: 150 mg/L — ABNORMAL HIGH (ref 0–19)

## 2021-08-31 LAB — BAYER DCA HB A1C WAIVED: HB A1C (BAYER DCA - WAIVED): 6.7 % — ABNORMAL HIGH (ref 4.8–5.6)

## 2021-08-31 NOTE — Progress Notes (Signed)
BP 127/74   Pulse 81   Temp 98.2 F (36.8 C)   Wt 175 lb 4.8 oz (79.5 kg)   SpO2 96%   BMI 23.77 kg/m    Subjective:    Patient ID: Maxwell Young, male    DOB: [birth year], 66 y.o.   MRN: [id number]  HPI: Maxwell Young is a 66 y.o. male  Chief Complaint  Patient presents with   Diabetes    Patient states he has not had eye exam this year yet    Hiccups    Patient states he's had the hiccups a few times while he's eating and it causes him to get choked.    DIABETES Hypoglycemic episodes:no Polydipsia/polyuria: no Visual disturbance: no Chest pain: no Paresthesias: no Glucose Monitoring: yes  Accucheck frequency:  occasionally Taking Insulin?: no Blood Pressure Monitoring: not checking Retinal Examination: Up to Date Foot Exam: Up to Date Diabetic Education: Completed Pneumovax: Up to Date Influenza: Up to Date Aspirin: yes  DYSPHAGIA Duration: weeks Description of symptom: hiccups and then can't swallow and throws up Onset: Several seconds after swallowing Location of dysphagia: throat Dysphagia to solids only: no Dysphagia to solids & liquids: no  Frequency:intermittent  Progressively getting worse: yes Alleviatiating factors: nothing Provoking factors: unknown Status: worse EGD: no Weight loss: yes Sensation of lump in throat: no Heartburn: no Odynophagia: no Nausea: no Vomiting: yes Drooling/nasal regurgitation/food spillage: no Coughing/choking/dysphonia: yes Dysarthria: no Hematemesis: no Regurgitation of undigested food/halitosis: no Chest pain: no  Relevant past medical, surgical, family and social history reviewed and updated as indicated. Interim medical history since our last visit reviewed. Allergies and medications reviewed and updated.  Review of Systems  Constitutional: Negative.   HENT:  Positive for trouble swallowing. Negative for congestion, dental problem, drooling, ear discharge, ear pain, facial swelling, hearing loss,  mouth sores, nosebleeds, postnasal drip, rhinorrhea, sinus pressure, sinus pain, sneezing, sore throat, tinnitus and voice change.   Respiratory: Negative.    Cardiovascular: Negative.   Gastrointestinal: Negative.   Neurological: Negative.   Psychiatric/Behavioral: Negative.      Per HPI unless specifically indicated above     Objective:    BP 127/74   Pulse 81   Temp 98.2 F (36.8 C)   Wt 175 lb 4.8 oz (79.5 kg)   SpO2 96%   BMI 23.77 kg/m   Wt Readings from Last 3 Encounters:  08/31/21 175 lb 4.8 oz (79.5 kg)  05/23/21 178 lb 12.8 oz (81.1 kg)  03/14/21 184 lb (83.5 kg)    Physical Exam Vitals and nursing note reviewed.  Constitutional:      General: He is not in acute distress.    Appearance: Normal appearance. He is normal weight. He is not ill-appearing, toxic-appearing or diaphoretic.  HENT:     Head: Normocephalic and atraumatic.     Right Ear: External ear normal.     Left Ear: External ear normal.     Nose: Nose normal.     Mouth/Throat:     Mouth: Mucous membranes are moist.     Pharynx: Oropharynx is clear.  Eyes:     General: No scleral icterus.       Right eye: No discharge.        Left eye: No discharge.     Extraocular Movements: Extraocular movements intact.     Conjunctiva/sclera: Conjunctivae normal.     Pupils: Pupils are equal, round, and reactive to light.  Cardiovascular:     Rate and  Rhythm: Normal rate and regular rhythm.     Pulses: Normal pulses.     Heart sounds: Normal heart sounds. No murmur heard.    No friction rub. No gallop.  Pulmonary:     Effort: Pulmonary effort is normal. No respiratory distress.     Breath sounds: Normal breath sounds. No stridor. No wheezing, rhonchi or rales.  Chest:     Chest wall: No tenderness.  Musculoskeletal:        General: Normal range of motion.     Cervical back: Normal range of motion and neck supple.  Skin:    General: Skin is warm and dry.     Capillary Refill: Capillary refill takes  less than 2 seconds.     Coloration: Skin is not jaundiced or pale.     Findings: No bruising, erythema, lesion or rash.  Neurological:     General: No focal deficit present.     Mental Status: He is alert and oriented to person, place, and time. Mental status is at baseline.  Psychiatric:        Mood and Affect: Mood normal.        Behavior: Behavior normal.        Thought Content: Thought content normal.        Judgment: Judgment normal.     Results for orders placed or performed in visit on 05/23/21  Comprehensive metabolic panel  Result Value Ref Range   Glucose 180 (H) 70 - 99 mg/dL   BUN 13 8 - 27 mg/dL   Creatinine, Ser 0.95 0.76 - 1.27 mg/dL   eGFR 89 >59 mL/min/1.73   BUN/Creatinine Ratio 14 10 - 24   Sodium 141 134 - 144 mmol/L   Potassium 4.4 3.5 - 5.2 mmol/L   Chloride 101 96 - 106 mmol/L   CO2 23 20 - 29 mmol/L   Calcium 9.8 8.6 - 10.2 mg/dL   Total Protein 7.0 6.0 - 8.5 g/dL   Albumin 4.5 3.8 - 4.8 g/dL   Globulin, Total 2.5 1.5 - 4.5 g/dL   Albumin/Globulin Ratio 1.8 1.2 - 2.2   Bilirubin Total 0.3 0.0 - 1.2 mg/dL   Alkaline Phosphatase 79 44 - 121 IU/L   AST 22 0 - 40 IU/L   ALT 29 0 - 44 IU/L  CBC with Differential/Platelet  Result Value Ref Range   WBC 9.0 3.4 - 10.8 x10E3/uL   RBC 5.54 4.14 - 5.80 x10E6/uL   Hemoglobin 17.1 13.0 - 17.7 g/dL   Hematocrit 50.5 37.5 - 51.0 %   MCV 91 79 - 97 fL   MCH 30.9 26.6 - 33.0 pg   MCHC 33.9 31.5 - 35.7 g/dL   RDW 13.6 11.6 - 15.4 %   Platelets 196 150 - 450 x10E3/uL   Neutrophils 64 Not Estab. %   Lymphs 24 Not Estab. %   Monocytes 10 Not Estab. %   Eos 1 Not Estab. %   Basos 1 Not Estab. %   Neutrophils Absolute 5.7 1.4 - 7.0 x10E3/uL   Lymphocytes Absolute 2.2 0.7 - 3.1 x10E3/uL   Monocytes Absolute 0.9 0.1 - 0.9 x10E3/uL   EOS (ABSOLUTE) 0.1 0.0 - 0.4 x10E3/uL   Basophils Absolute 0.1 0.0 - 0.2 x10E3/uL   Immature Granulocytes 0 Not Estab. %   Immature Grans (Abs) 0.0 0.0 - 0.1 x10E3/uL  Bayer DCA  Hb A1c Waived  Result Value Ref Range   HB A1C (BAYER DCA - WAIVED) 7.6 (H) 4.8 - 5.6 %  Lipid Panel w/o Chol/HDL Ratio  Result Value Ref Range   Cholesterol, Total 123 100 - 199 mg/dL   Triglycerides 308 (H) 0 - 149 mg/dL   HDL 44 >39 mg/dL   VLDL Cholesterol Cal 45 (H) 5 - 40 mg/dL   LDL Chol Calc (NIH) 34 0 - 99 mg/dL      Assessment & Plan:   Problem List Items Addressed This Visit       Endocrine   Type 2 diabetes mellitus with other specified complication ([place]) - Primary    Doing great with A1c of 6.7. Continue current regimen. Continue to monitor. Call with any concerns.       Other Visit Diagnoses     Dysphagia, unspecified type       Will get him in for SLP evaluation/Barium swallow. Await their input.    Relevant Orders   SLP eval and treat        Follow up plan: Return in about 3 months (around 12/01/2021) for physical.

## 2021-08-31 NOTE — Assessment & Plan Note (Signed)
Doing great with A1c of 6.7. Continue current regimen. Continue to monitor. Call with any concerns.

## 2021-09-17 ENCOUNTER — Other Ambulatory Visit: Payer: Self-pay | Admitting: Family Medicine

## 2021-09-17 DIAGNOSIS — E7800 Pure hypercholesterolemia, unspecified: Secondary | ICD-10-CM

## 2021-09-18 NOTE — Telephone Encounter (Signed)
Requested medications are due for refill today.  No  Requested medications are on the active medications list.  yes  Last refill. Lovaza Refilled 05/23/2021 #360 1 refill. Statin refilled 05/09/2021 #90 1 refill  Future visit scheduled.   yes  Notes to clinic.  Pt is requesting a 1 year supply.    Requested Prescriptions  Pending Prescriptions Disp Refills   omega-3 acid ethyl esters (LOVAZA) 1 g capsule [Pharmacy Med Name: Omega-3-acid Ethyl Esters 1 GM Oral Capsule] 360 capsule 3    Sig: TAKE 2 CAPSULES BY MOUTH TWICE  DAILY     Endocrinology:  Nutritional Agents - omega-3 acid ethyl esters Failed - 09/17/2021 11:18 AM      Failed - Lipid Panel in normal range within the last 12 months    Cholesterol, Total  Date Value Ref Range Status  05/23/2021 123 100 - 199 mg/dL Final   Cholesterol Piccolo, Waived  Date Value Ref Range Status  08/26/2017 165 <200 mg/dL Final    Comment:                            Desirable                <200                         Borderline High      200- 239                         High                     >239    LDL Chol Calc (NIH)  Date Value Ref Range Status  05/23/2021 34 0 - 99 mg/dL Final   HDL  Date Value Ref Range Status  05/23/2021 44 >39 mg/dL Final   Triglycerides  Date Value Ref Range Status  05/23/2021 308 (H) 0 - 149 mg/dL Final   Triglycerides Piccolo,Waived  Date Value Ref Range Status  08/26/2017 412 (H) <150 mg/dL Final    Comment:                            Normal                   <150                         Borderline High     150 - 199                         High                200 - 499                         Very High                >499          Passed - Valid encounter within last 12 months    Recent Outpatient Visits           2 weeks ago Type 2 diabetes mellitus with diabetic peripheral angiopathy without gangrene, without long-term current use of insulin ([place])   [place],  [place], DO  3 months ago Controlled type 2 diabetes mellitus without complication, without long-term current use of insulin ([place])   [place], Megan P, DO   9 months ago Controlled type 2 diabetes mellitus without complication, without long-term current use of insulin ([place])   [place], Megan P, DO   1 year ago Routine general medical examination at a health care facility   [facility], [place], DO   1 year ago Essential hypertension   [place], Megan P, DO       Future Appointments             In 2 months Johnson, Megan P, DO [place], [facility]             rosuvastatin (CRESTOR) 40 MG tablet [Pharmacy Med Name: Rosuvastatin Calcium 40 MG Oral Tablet] 90 tablet 3    Sig: TAKE 1 TABLET BY MOUTH DAILY     Cardiovascular:  Antilipid - Statins 2 Failed - 09/17/2021 11:18 AM      Failed - Lipid Panel in normal range within the last 12 months    Cholesterol, Total  Date Value Ref Range Status  05/23/2021 123 100 - 199 mg/dL Final   Cholesterol Piccolo, Waived  Date Value Ref Range Status  08/26/2017 165 <200 mg/dL Final    Comment:                            Desirable                <200                         Borderline High      200- 239                         High                     >239    LDL Chol Calc (NIH)  Date Value Ref Range Status  05/23/2021 34 0 - 99 mg/dL Final   HDL  Date Value Ref Range Status  05/23/2021 44 >39 mg/dL Final   Triglycerides  Date Value Ref Range Status  05/23/2021 308 (H) 0 - 149 mg/dL Final   Triglycerides Piccolo,Waived  Date Value Ref Range Status  08/26/2017 412 (H) <150 mg/dL Final    Comment:                            Normal                   <150                         Borderline High     150 - 199                         High                200 - 499                         Very High                >499  Passed - Cr in normal range and within 360 days    Creatinine, Ser  Date Value Ref Range Status  05/23/2021 0.95 0.76 - 1.27 mg/dL Final         Passed - Patient is not pregnant      Passed - Valid encounter within last 12 months    Recent Outpatient Visits           2 weeks ago Type 2 diabetes mellitus with diabetic peripheral angiopathy without gangrene, without long-term current use of insulin ([place])   [place], Megan P, DO   3 months ago Controlled type 2 diabetes mellitus without complication, without long-term current use of insulin ([place])   [place], Megan P, DO   9 months ago Controlled type 2 diabetes mellitus without complication, without long-term current use of insulin ([place])   [place], Megan P, DO   1 year ago Routine general medical examination at a health care facility   [place], [place], DO   1 year ago Essential hypertension   [place], [place], DO       Future Appointments             In 2 months Wynetta Emery, Barb Merino, DO [place], [facility]             cf

## 2021-10-23 ENCOUNTER — Other Ambulatory Visit: Payer: Self-pay | Admitting: Family Medicine

## 2021-10-23 DIAGNOSIS — E7800 Pure hypercholesterolemia, unspecified: Secondary | ICD-10-CM

## 2021-10-23 NOTE — Telephone Encounter (Signed)
Requested Prescriptions  Pending Prescriptions Disp Refills  . rosuvastatin (CRESTOR) 40 MG tablet [Pharmacy Med Name: Rosuvastatin Calcium 40 MG Oral Tablet] 90 tablet 0    Sig: TAKE 1 TABLET BY MOUTH DAILY     Cardiovascular:  Antilipid - Statins 2 Failed - 10/23/2021  2:47 AM      Failed - Lipid Panel in normal range within the last 12 months    Cholesterol, Total  Date Value Ref Range Status  05/23/2021 123 100 - 199 mg/dL Final   Cholesterol Piccolo, Waived  Date Value Ref Range Status  08/26/2017 165 <200 mg/dL Final    Comment:                            Desirable                <200                         Borderline High      200- 239                         High                     >239    LDL Chol Calc (NIH)  Date Value Ref Range Status  05/23/2021 34 0 - 99 mg/dL Final   HDL  Date Value Ref Range Status  05/23/2021 44 >39 mg/dL Final   Triglycerides  Date Value Ref Range Status  05/23/2021 308 (H) 0 - 149 mg/dL Final   Triglycerides Piccolo,Waived  Date Value Ref Range Status  08/26/2017 412 (H) <150 mg/dL Final    Comment:                            Normal                   <150                         Borderline High     150 - 199                         High                200 - 499                         Very High                >499          Passed - Cr in normal range and within 360 days    Creatinine, Ser  Date Value Ref Range Status  05/23/2021 0.95 0.76 - 1.27 mg/dL Final         Passed - Patient is not pregnant      Passed - Valid encounter within last 12 months    Recent Outpatient Visits          1 month ago Type 2 diabetes mellitus with diabetic peripheral angiopathy without gangrene, without long-term current use of insulin ([place])   [facility] [place], Megan P, DO   5 months ago Controlled type 2 diabetes mellitus without complication, without long-term current use of insulin ([place])  [facility],  Megan P, DO   11 months ago Controlled type 2 diabetes mellitus without complication, without long-term current use of insulin ([place])   [place], [place], DO   1 year ago Routine general medical examination at a health care facility   [place], [place], DO   1 year ago Essential hypertension   [facility] Valerie Roys, DO      Future Appointments            In 1 month Johnson, Barb Merino, DO [employer], [facility]

## 2021-10-26 ENCOUNTER — Other Ambulatory Visit: Payer: Self-pay | Admitting: Family Medicine

## 2021-10-29 NOTE — Telephone Encounter (Signed)
Requested Prescriptions  Pending Prescriptions Disp Refills  . omega-3 acid ethyl esters (LOVAZA) 1 g capsule [Pharmacy Med Name: Omega-3-acid Ethyl Esters 1 GM Oral Capsule] 360 capsule 3    Sig: TAKE 2 CAPSULES BY MOUTH TWICE  DAILY     Endocrinology:  Nutritional Agents - omega-3 acid ethyl esters Failed - 10/26/2021 10:34 PM      Failed - Lipid Panel in normal range within the last 12 months    Cholesterol, Total  Date Value Ref Range Status  05/23/2021 123 100 - 199 mg/dL Final   Cholesterol Piccolo, Waived  Date Value Ref Range Status  08/26/2017 165 <200 mg/dL Final    Comment:                            Desirable                <200                         Borderline High      200- 239                         High                     >239    LDL Chol Calc (NIH)  Date Value Ref Range Status  05/23/2021 34 0 - 99 mg/dL Final   HDL  Date Value Ref Range Status  05/23/2021 44 >39 mg/dL Final   Triglycerides  Date Value Ref Range Status  05/23/2021 308 (H) 0 - 149 mg/dL Final   Triglycerides Piccolo,Waived  Date Value Ref Range Status  08/26/2017 412 (H) <150 mg/dL Final    Comment:                            Normal                   <150                         Borderline High     150 - 199                         High                200 - 499                         Very High                >499          Passed - Valid encounter within last 12 months    Recent Outpatient Visits          1 month ago Type 2 diabetes mellitus with diabetic peripheral angiopathy without gangrene, without long-term current use of insulin ([place])   [place], Megan P, DO   5 months ago Controlled type 2 diabetes mellitus without complication, without long-term current use of insulin ([place])   [place], Megan P, DO   11 months ago Controlled type 2 diabetes mellitus without complication, without long-term current use of insulin ([place])    [place], [place], DO  1 year ago Routine general medical examination at a health care facility   [place], [place], DO   1 year ago Essential hypertension   [facility] Valerie Roys, DO      Future Appointments            In 1 month Wynetta Emery, Barb Merino, DO [employer], [facility]

## 2021-11-24 ENCOUNTER — Other Ambulatory Visit: Payer: Self-pay | Admitting: Family Medicine

## 2021-11-24 DIAGNOSIS — E7800 Pure hypercholesterolemia, unspecified: Secondary | ICD-10-CM

## 2021-11-26 NOTE — Telephone Encounter (Signed)
Requested Prescriptions  Pending Prescriptions Disp Refills   rosuvastatin (CRESTOR) 40 MG tablet [Pharmacy Med Name: Rosuvastatin Calcium 40 MG Oral Tablet] 90 tablet 3    Sig: TAKE 1 TABLET BY MOUTH DAILY     Cardiovascular:  Antilipid - Statins 2 Failed - 11/24/2021 11:04 PM      Failed - Lipid Panel in normal range within the last 12 months    Cholesterol, Total  Date Value Ref Range Status  05/23/2021 123 100 - 199 mg/dL Final   Cholesterol Piccolo, Waived  Date Value Ref Range Status  08/26/2017 165 <200 mg/dL Final    Comment:                            Desirable                <200                         Borderline High      200- 239                         High                     >239    LDL Chol Calc (NIH)  Date Value Ref Range Status  05/23/2021 34 0 - 99 mg/dL Final   HDL  Date Value Ref Range Status  05/23/2021 44 >39 mg/dL Final   Triglycerides  Date Value Ref Range Status  05/23/2021 308 (H) 0 - 149 mg/dL Final   Triglycerides Piccolo,Waived  Date Value Ref Range Status  08/26/2017 412 (H) <150 mg/dL Final    Comment:                            Normal                   <150                         Borderline High     150 - 199                         High                200 - 499                         Very High                >499          Passed - Cr in normal range and within 360 days    Creatinine, Ser  Date Value Ref Range Status  05/23/2021 0.95 0.76 - 1.27 mg/dL Final         Passed - Patient is not pregnant      Passed - Valid encounter within last 12 months    Recent Outpatient Visits           2 months ago Type 2 diabetes mellitus with diabetic peripheral angiopathy without gangrene, without long-term current use of insulin ([place])   [facility] [place], Megan P, DO   6 months ago Controlled type 2 diabetes mellitus without complication, without long-term current use of insulin ([place])  [place] P, DO   1 year ago Controlled type 2 diabetes mellitus without complication, without long-term current use of insulin ([place])   [place], [place], DO   1 year ago Routine general medical examination at a health care facility   [facility], [place], DO   1 year ago Essential hypertension   [place], Megan P, DO       Future Appointments             In 1 week [place], Megan P, DO [place], [facility]             amLODipine (NORVASC) 10 MG tablet [Pharmacy Med Name: amLODIPine Besylate 10 MG Oral Tablet] 90 tablet 3    Sig: TAKE 1 TABLET BY MOUTH DAILY     Cardiovascular: Calcium Channel Blockers 2 Passed - 11/24/2021 11:04 PM      Passed - Last BP in normal range    BP Readings from Last 1 Encounters:  08/31/21 127/74         Passed - Last Heart Rate in normal range    Pulse Readings from Last 1 Encounters:  08/31/21 81         Passed - Valid encounter within last 6 months    Recent Outpatient Visits           2 months ago Type 2 diabetes mellitus with diabetic peripheral angiopathy without gangrene, without long-term current use of insulin ([place])   [facility], Megan P, DO   6 months ago Controlled type 2 diabetes mellitus without complication, without long-term current use of insulin ([place])   [place], Megan P, DO   1 year ago Controlled type 2 diabetes mellitus without complication, without long-term current use of insulin ([place])   [place], Megan P, DO   1 year ago Routine general medical examination at a health care facility   [place], [place], DO   1 year ago Essential hypertension   [place] [facility] Valerie Roys, DO       Future Appointments             In 1 week Wynetta Emery, Barb Merino, DO [facility], [facility]

## 2021-12-03 ENCOUNTER — Encounter: Payer: Self-pay | Admitting: Family Medicine

## 2021-12-03 ENCOUNTER — Ambulatory Visit (INDEPENDENT_AMBULATORY_CARE_PROVIDER_SITE_OTHER): Payer: 59 | Admitting: Family Medicine

## 2021-12-03 VITALS — BP 121/73 | HR 87 | Temp 98.9°F | Ht 72.5 in | Wt 178.3 lb

## 2021-12-03 DIAGNOSIS — J439 Emphysema, unspecified: Secondary | ICD-10-CM | POA: Diagnosis not present

## 2021-12-03 DIAGNOSIS — N401 Enlarged prostate with lower urinary tract symptoms: Secondary | ICD-10-CM

## 2021-12-03 DIAGNOSIS — I739 Peripheral vascular disease, unspecified: Secondary | ICD-10-CM

## 2021-12-03 DIAGNOSIS — E1169 Type 2 diabetes mellitus with other specified complication: Secondary | ICD-10-CM

## 2021-12-03 DIAGNOSIS — I10 Essential (primary) hypertension: Secondary | ICD-10-CM

## 2021-12-03 DIAGNOSIS — Z23 Encounter for immunization: Secondary | ICD-10-CM | POA: Diagnosis not present

## 2021-12-03 DIAGNOSIS — Z0000 Encounter for general adult medical examination without abnormal findings: Secondary | ICD-10-CM | POA: Diagnosis not present

## 2021-12-03 DIAGNOSIS — K219 Gastro-esophageal reflux disease without esophagitis: Secondary | ICD-10-CM

## 2021-12-03 DIAGNOSIS — I700 Atherosclerosis of aorta: Secondary | ICD-10-CM | POA: Diagnosis not present

## 2021-12-03 DIAGNOSIS — E7800 Pure hypercholesterolemia, unspecified: Secondary | ICD-10-CM

## 2021-12-03 DIAGNOSIS — I2510 Atherosclerotic heart disease of native coronary artery without angina pectoris: Secondary | ICD-10-CM

## 2021-12-03 LAB — URINALYSIS, ROUTINE W REFLEX MICROSCOPIC
Bilirubin, UA: NEGATIVE
Glucose, UA: NEGATIVE
Ketones, UA: NEGATIVE
Leukocytes,UA: NEGATIVE
Nitrite, UA: NEGATIVE
Protein,UA: NEGATIVE
RBC, UA: NEGATIVE
Specific Gravity, UA: 1.01 (ref 1.005–1.030)
Urobilinogen, Ur: 0.2 mg/dL (ref 0.2–1.0)
pH, UA: 6 (ref 5.0–7.5)

## 2021-12-03 LAB — MICROALBUMIN, URINE WAIVED
Creatinine, Urine Waived: 50 mg/dL (ref 10–300)
Microalb, Ur Waived: 30 mg/L — ABNORMAL HIGH (ref 0–19)

## 2021-12-03 LAB — BAYER DCA HB A1C WAIVED: HB A1C (BAYER DCA - WAIVED): 7.2 % — ABNORMAL HIGH (ref 4.8–5.6)

## 2021-12-03 MED ORDER — OMEGA-3-ACID ETHYL ESTERS 1 G PO CAPS: ORAL_CAPSULE | ORAL | 1 refills | Status: DC

## 2021-12-03 MED ORDER — AMLODIPINE BESYLATE 10 MG PO TABS: 10.0000 mg | ORAL_TABLET | Freq: Every day | ORAL | 1 refills | Status: DC

## 2021-12-03 MED ORDER — METFORMIN HCL 500 MG PO TABS: 1000.0000 mg | ORAL_TABLET | Freq: Two times a day (BID) | ORAL | 1 refills | Status: DC

## 2021-12-03 MED ORDER — METOPROLOL SUCCINATE ER 100 MG PO TB24: 100.0000 mg | ORAL_TABLET | Freq: Every day | ORAL | 1 refills | Status: DC

## 2021-12-03 MED ORDER — TAMSULOSIN HCL 0.4 MG PO CAPS: 0.8000 mg | ORAL_CAPSULE | Freq: Every day | ORAL | 1 refills | Status: DC

## 2021-12-03 NOTE — Assessment & Plan Note (Signed)
Under good control on current regimen. Continue current regimen. Continue to monitor. Call with any concerns. Refills given. Labs drawn today.   

## 2021-12-03 NOTE — Assessment & Plan Note (Signed)
Will keep BP, sugars and cholesterol under good control. Continue to monitor. Call with any concerns.  

## 2021-12-03 NOTE — Assessment & Plan Note (Signed)
Up slightly at 7.2. Will watch his diet and recheck 3 months. Call with any concerns.

## 2021-12-03 NOTE — Assessment & Plan Note (Signed)
Doing well not on medication. Continue to monitor. Call with any concerns.

## 2021-12-03 NOTE — Progress Notes (Signed)
BP 121/73   Pulse 87   Temp 98.9 F (37.2 C) (Oral)   Ht 6' 0.5" (1.842 m)   Wt 178 lb 4.8 oz (80.9 kg)   SpO2 98%   BMI 23.85 kg/m    Subjective:    Patient ID: Maxwell Young, male    DOB: April 12, 1955, 66 y.o.   MRN: [id number]  HPI: Maxwell Young is a 66 y.o. male presenting on 12/03/2021 for comprehensive medical examination. Current medical complaints include:  DIABETES Hypoglycemic episodes:no Polydipsia/polyuria: no Visual disturbance: no Chest pain: no Paresthesias: no Glucose Monitoring: no  Accucheck frequency: Not Checking Taking Insulin?: no Blood Pressure Monitoring: not checking Retinal Examination:  seeing them this week Foot Exam: Up to Date Diabetic Education: Completed Pneumovax: Up to Date Influenza: Up to Date Aspirin: yes  HYPERTENSION / HYPERLIPIDEMIA Satisfied with current treatment? yes Duration of hypertension: chronic BP monitoring frequency: not checking BP medication side effects: no Past BP meds: amlodipine, metoprolol Duration of hyperlipidemia: chronic Cholesterol medication side effects: no Cholesterol supplements: fish oil Past cholesterol medications: crestor, welchol Medication compliance: excellent compliance Aspirin: yes Recent stressors: no Recurrent headaches: no Visual changes: no Palpitations: no Dyspnea: no Chest pain: no Lower extremity edema: no Dizzy/lightheaded: no  BPH BPH status: uncontrolled Satisfied with current treatment?: no Medication side effects: no Medication compliance: excellent compliance Duration: chronic Nocturia: 2-3x per night Urinary frequency:no Incomplete voiding: yes Urgency: yes Weak urinary stream: yes Straining to start stream: yes Dysuria: no Onset: gradual Severity: moderate  Interim Problems from his last visit: no  Depression Screen done today and results listed below:     12/03/2021    9:44 AM 08/31/2021   10:11 AM 05/23/2021    8:32 AM 11/23/2020    8:40 AM  08/23/2020    8:08 AM  Depression screen PHQ 2/9  Decreased Interest 0 0 0 0 0  Down, Depressed, Hopeless 0 0 0 0 0  PHQ - 2 Score 0 0 0 0 0  Altered sleeping 1 0 0 0   Tired, decreased energy 1 0 0 0   Change in appetite 1 0 0 0   Feeling bad or failure about yourself  0 0 0 0   Trouble concentrating 0 0 0 0   Moving slowly or fidgety/restless 0 0 0 0   Suicidal thoughts 0 0 0 0   PHQ-9 Score 3 0 0 0   Difficult doing work/chores Not difficult at all Not difficult at all       Past Medical History:  Past Medical History:  Diagnosis Date   CAD (coronary artery disease)    Colon polyps 01/21/2009   Diabetes mellitus without complication (HCC)    Diverticula of colon 01/21/2009   Elevated fasting glucose    GERD (gastroesophageal reflux disease)    History of hemorrhoids 01/21/2009   internal   Hyperlipidemia    Hypertension    Personal history of tobacco use, presenting hazards to health 07/27/2015   Wears dentures    full upper and lower    Surgical History:  Past Surgical History:  Procedure Laterality Date   CERVICAL FUSION  06/09/003   COLONOSCOPY     COLONOSCOPY N/A 09/14/2020   Procedure: COLONOSCOPY;  Surgeon: Lucilla Lame, MD;  Location: [facility] ENDOSCOPY;  Service: Endoscopy;  Laterality: N/A;   COLONOSCOPY WITH PROPOFOL N/A 07/24/2015   Procedure: COLONOSCOPY WITH PROPOFOL;  Surgeon: Lucilla Lame, MD;  Location: [place];  Service: Endoscopy;  Laterality: N/A;  CORONARY ARTERY BYPASS GRAFT  2008   [facility] - 3 vessel   POLYPECTOMY  07/24/2015   Procedure: POLYPECTOMY;  Surgeon: Lucilla Lame, MD;  Location: [place];  Service: Endoscopy;;    Medications:  Current Outpatient Medications on File Prior to Visit  Medication Sig   aspirin EC 325 MG tablet Take 325 mg by mouth daily.   colesevelam (WELCHOL) 625 MG tablet Take 3 tablets (1,875 mg total) by mouth 2 (two) times daily.   Multiple Vitamins-Minerals (MULTIVITAMIN ADULT PO) Take by mouth  daily.   omeprazole (PRILOSEC) 20 MG capsule Take 20 mg by mouth daily.   rosuvastatin (CRESTOR) 40 MG tablet TAKE 1 TABLET BY MOUTH DAILY   No current facility-administered medications on file prior to visit.    Allergies:  No Known Allergies  Social History:  Social History   Socioeconomic History   Marital status: Married    Spouse name: Not on file   Number of children: Not on file   Years of education: Not on file   Highest education level: Not on file  Occupational History   Not on file  Tobacco Use   Smoking status: Every Day    Packs/day: 1.00    Years: 40.00    Total pack years: 40.00    Types: Cigarettes   Smokeless tobacco: Never  Vaping Use   Vaping Use: Never used  Substance and Sexual Activity   Alcohol use: No   Drug use: No   Sexual activity: Yes  Other Topics Concern   Not on file  Social History Narrative   Not on file   Social Determinants of Health   Financial Resource Strain: Not on file  Food Insecurity: Not on file  Transportation Needs: Not on file  Physical Activity: Not on file  Stress: Not on file  Social Connections: Not on file  Intimate Partner Violence: Not on file   Social History   Tobacco Use  Smoking Status Every Day   Packs/day: 1.00   Years: 40.00   Total pack years: 40.00   Types: Cigarettes  Smokeless Tobacco Never   Social History   Substance and Sexual Activity  Alcohol Use No    Family History:  Family History  Problem Relation Age of Onset   Hypertension Mother    Heart disease Mother        CAD   Cancer Father        colon, prostate   Stroke Father    Parkinson's disease Father     Past medical history, surgical history, medications, allergies, family history and social history reviewed with patient today and changes made to appropriate areas of the chart.   Review of Systems  Constitutional: Negative.   HENT:  Positive for congestion. Negative for ear discharge, ear pain, hearing loss,  nosebleeds, sinus pain, sore throat and tinnitus.   Eyes: Negative.   Respiratory:  Positive for cough. Negative for hemoptysis, sputum production, shortness of breath, wheezing and stridor.   Cardiovascular: Negative.   Gastrointestinal: Negative.   Genitourinary: Negative.   Musculoskeletal:  Positive for myalgias. Negative for back pain, falls, joint pain and neck pain.  Skin: Negative.   Neurological: Negative.   Endo/Heme/Allergies:  Positive for environmental allergies. Negative for polydipsia. Does not bruise/bleed easily.  Psychiatric/Behavioral: Negative.     All other ROS negative except what is listed above and in the HPI.      Objective:    BP 121/73   Pulse 87  Temp 98.9 F (37.2 C) (Oral)   Ht 6' 0.5" (1.842 m)   Wt 178 lb 4.8 oz (80.9 kg)   SpO2 98%   BMI 23.85 kg/m   Wt Readings from Last 3 Encounters:  12/03/21 178 lb 4.8 oz (80.9 kg)  08/31/21 175 lb 4.8 oz (79.5 kg)  05/23/21 178 lb 12.8 oz (81.1 kg)    Physical Exam Vitals and nursing note reviewed.  Constitutional:      General: He is not in acute distress.    Appearance: Normal appearance. He is normal weight. He is not ill-appearing, toxic-appearing or diaphoretic.  HENT:     Head: Normocephalic and atraumatic.     Right Ear: Tympanic membrane, ear canal and external ear normal. There is no impacted cerumen.     Left Ear: Tympanic membrane, ear canal and external ear normal. There is no impacted cerumen.     Nose: Nose normal. No congestion or rhinorrhea.     Mouth/Throat:     Mouth: Mucous membranes are moist.     Pharynx: Oropharynx is clear. No oropharyngeal exudate or posterior oropharyngeal erythema.  Eyes:     General: No scleral icterus.       Right eye: No discharge.        Left eye: No discharge.     Extraocular Movements: Extraocular movements intact.     Conjunctiva/sclera: Conjunctivae normal.     Pupils: Pupils are equal, round, and reactive to light.  Neck:     Vascular: No  carotid bruit.  Cardiovascular:     Rate and Rhythm: Normal rate and regular rhythm.     Pulses: Normal pulses.     Heart sounds: No murmur heard.    No friction rub. No gallop.  Pulmonary:     Effort: Pulmonary effort is normal. No respiratory distress.     Breath sounds: Normal breath sounds. No stridor. No wheezing, rhonchi or rales.  Chest:     Chest wall: No tenderness.  Abdominal:     General: Abdomen is flat. Bowel sounds are normal. There is no distension.     Palpations: Abdomen is soft. There is no mass.     Tenderness: There is no abdominal tenderness. There is no right CVA tenderness, left CVA tenderness, guarding or rebound.     Hernia: No hernia is present.  Genitourinary:    Comments: Genital exam deferred with shared decision making Musculoskeletal:        General: No swelling, tenderness, deformity or signs of injury.     Cervical back: Normal range of motion and neck supple. No rigidity. No muscular tenderness.     Right lower leg: No edema.     Left lower leg: No edema.  Lymphadenopathy:     Cervical: No cervical adenopathy.  Skin:    General: Skin is warm and dry.     Capillary Refill: Capillary refill takes less than 2 seconds.     Coloration: Skin is not jaundiced or pale.     Findings: No bruising, erythema, lesion or rash.  Neurological:     General: No focal deficit present.     Mental Status: He is alert and oriented to person, place, and time.     Cranial Nerves: No cranial nerve deficit.     Sensory: No sensory deficit.     Motor: No weakness.     Coordination: Coordination normal.     Gait: Gait normal.     Deep Tendon Reflexes: Reflexes normal.  Psychiatric:        Mood and Affect: Mood normal.        Behavior: Behavior normal.        Thought Content: Thought content normal.        Judgment: Judgment normal.     Results for orders placed or performed in visit on 08/31/21  Bayer DCA Hb A1c Waived  Result Value Ref Range   HB A1C (BAYER  DCA - WAIVED) 6.7 (H) 4.8 - 5.6 %  Microalbumin, Urine Waived  Result Value Ref Range   Microalb, Ur Waived 150 (H) 0 - 19 mg/L   Creatinine, Urine Waived 300 10 - 300 mg/dL   Microalb/Creat Ratio 30-300 (H) <30 mg/g      Assessment & Plan:   Problem List Items Addressed This Visit       Cardiovascular and Mediastinum   Essential hypertension    Under good control on current regimen. Continue current regimen. Continue to monitor. Call with any concerns. Refills given. Labs drawn today.       Relevant Medications   amLODipine (NORVASC) 10 MG tablet   metoprolol succinate (TOPROL-XL) 100 MG 24 hr tablet   omega-3 acid ethyl esters (LOVAZA) 1 g capsule   Other Relevant Orders   Comprehensive metabolic panel   CBC with Differential/Platelet   TSH   Urinalysis, Routine w reflex microscopic   Microalbumin, Urine Waived   CAD (coronary artery disease)    Will keep BP, sugars and cholesterol under good control. Continue to monitor. Call with any concerns.       Relevant Medications   amLODipine (NORVASC) 10 MG tablet   metoprolol succinate (TOPROL-XL) 100 MG 24 hr tablet   omega-3 acid ethyl esters (LOVAZA) 1 g capsule   Other Relevant Orders   Comprehensive metabolic panel   CBC with Differential/Platelet   Lipid Panel w/o Chol/HDL Ratio   Aortic atherosclerosis (HCC)    Will keep BP, sugars and cholesterol under good control. Continue to monitor. Call with any concerns.       Relevant Medications   amLODipine (NORVASC) 10 MG tablet   metoprolol succinate (TOPROL-XL) 100 MG 24 hr tablet   omega-3 acid ethyl esters (LOVAZA) 1 g capsule   Other Relevant Orders   Comprehensive metabolic panel   CBC with Differential/Platelet   Lipid Panel w/o Chol/HDL Ratio   PAD (peripheral artery disease) (HCC)    Will keep BP, sugars and cholesterol under good control. Continue to monitor. Call with any concerns.       Relevant Medications   amLODipine (NORVASC) 10 MG tablet    metoprolol succinate (TOPROL-XL) 100 MG 24 hr tablet   omega-3 acid ethyl esters (LOVAZA) 1 g capsule     Respiratory   COPD (chronic obstructive pulmonary disease) with emphysema (HCC)    Doing well not on medication. Continue to monitor. Call with any concerns.        Relevant Orders   Comprehensive metabolic panel   CBC with Differential/Platelet     Digestive   GERD (gastroesophageal reflux disease)    Under good control on current regimen. Continue current regimen. Continue to monitor. Call with any concerns. Refills given. Labs drawn today.        Relevant Orders   Comprehensive metabolic panel   CBC with Differential/Platelet     Endocrine   Type 2 diabetes mellitus with other specified complication ([place])    Up slightly at 7.2. Will watch his diet and recheck 3 months.  Call with any concerns.       Relevant Medications   metFORMIN (GLUCOPHAGE) 500 MG tablet   Other Relevant Orders   Comprehensive metabolic panel   CBC with Differential/Platelet   Lipid Panel w/o Chol/HDL Ratio   Microalbumin, Urine Waived   Bayer DCA Hb A1c Waived     Genitourinary   Benign prostatic hyperplasia with lower urinary tract symptoms    Not under great control. Will increase his flomax to 0.'8mg'$ . Recheck 3 months. Call with any concerns.       Relevant Medications   tamsulosin (FLOMAX) 0.4 MG CAPS capsule   Other Relevant Orders   Comprehensive metabolic panel   CBC with Differential/Platelet   PSA   Urinalysis, Routine w reflex microscopic     Other   Hyperlipidemia    Under good control on current regimen. Continue current regimen. Continue to monitor. Call with any concerns. Refills given. Labs drawn today.       Relevant Medications   amLODipine (NORVASC) 10 MG tablet   metoprolol succinate (TOPROL-XL) 100 MG 24 hr tablet   omega-3 acid ethyl esters (LOVAZA) 1 g capsule   Other Relevant Orders   Comprehensive metabolic panel   CBC with Differential/Platelet   Lipid  Panel w/o Chol/HDL Ratio   Other Visit Diagnoses     Routine general medical examination at a health care facility    -  Primary   Vaccines updated. Screening labs checked today. Colonoscopy up to date. Continue diet and exercise. Call with any concerns. Continue to monitor.   Relevant Orders   Comprehensive metabolic panel   CBC with Differential/Platelet   Lipid Panel w/o Chol/HDL Ratio   PSA   TSH   Urinalysis, Routine w reflex microscopic   Microalbumin, Urine Waived   Bayer DCA Hb A1c Waived   Flu vaccine need       Relevant Orders   Flu Vaccine QUAD 6+ mos PF IM (Fluarix Quad PF) (Completed)        Discussed aspirin prophylaxis for myocardial infarction prevention and decision was made to continue ASA  LABORATORY TESTING:  Health maintenance labs ordered today as discussed above.   The natural history of prostate cancer and ongoing controversy regarding screening and potential treatment outcomes of prostate cancer has been discussed with the patient. The meaning of a false positive PSA and a false negative PSA has been discussed. He indicates understanding of the limitations of this screening test and wishes to proceed with screening PSA testing.   IMMUNIZATIONS:   - Tdap: Tetanus vaccination status reviewed: last tetanus booster within 10 years. - Influenza: Administered today - Pneumovax: Up to date - Prevnar: Not applicable - COVID: Up to date - HPV: Not applicable - Shingrix vaccine: Administered today  SCREENING: - Colonoscopy: Up to date  Discussed with patient purpose of the colonoscopy is to detect colon cancer at curable precancerous or early stages    PATIENT COUNSELING:    Sexuality: Discussed sexually transmitted diseases, partner selection, use of condoms, avoidance of unintended pregnancy  and contraceptive alternatives.   Advised to avoid cigarette smoking.  I discussed with the patient that most people either abstain from alcohol or drink within  safe limits (<=14/week and <=4 drinks/occasion for males, <=7/weeks and <= 3 drinks/occasion for females) and that the risk for alcohol disorders and other health effects rises proportionally with the number of drinks per week and how often a drinker exceeds daily limits.  Discussed cessation/primary prevention of drug use  and availability of treatment for abuse.   Diet: Encouraged to adjust caloric intake to maintain  or achieve ideal body weight, to reduce intake of dietary saturated fat and total fat, to limit sodium intake by avoiding high sodium foods and not adding table salt, and to maintain adequate dietary potassium and calcium preferably from fresh fruits, vegetables, and low-fat dairy products.    stressed the importance of regular exercise  Injury prevention: Discussed safety belts, safety helmets, smoke detector, smoking near bedding or upholstery.   Dental health: Discussed importance of regular tooth brushing, flossing, and dental visits.   Follow up plan: NEXT PREVENTATIVE PHYSICAL DUE IN 1 YEAR. Return in about 3 months (around 03/05/2022) for follow up and 2nd shingles.

## 2021-12-03 NOTE — Assessment & Plan Note (Signed)
Not under great control. Will increase his flomax to 0.'8mg'$ . Recheck 3 months. Call with any concerns.

## 2021-12-04 LAB — COMPREHENSIVE METABOLIC PANEL
ALT: 21 IU/L (ref 0–44)
AST: 24 IU/L (ref 0–40)
Albumin/Globulin Ratio: 2 (ref 1.2–2.2)
Albumin: 4.7 g/dL (ref 3.9–4.9)
Alkaline Phosphatase: 79 IU/L (ref 44–121)
BUN/Creatinine Ratio: 10 (ref 10–24)
BUN: 9 mg/dL (ref 8–27)
Bilirubin Total: 0.4 mg/dL (ref 0.0–1.2)
CO2: 22 mmol/L (ref 20–29)
Calcium: 9.4 mg/dL (ref 8.6–10.2)
Chloride: 100 mmol/L (ref 96–106)
Creatinine, Ser: 0.91 mg/dL (ref 0.76–1.27)
Globulin, Total: 2.3 g/dL (ref 1.5–4.5)
Glucose: 197 mg/dL — ABNORMAL HIGH (ref 70–99)
Potassium: 4.4 mmol/L (ref 3.5–5.2)
Sodium: 141 mmol/L (ref 134–144)
Total Protein: 7 g/dL (ref 6.0–8.5)
eGFR: 94 mL/min/{1.73_m2} (ref >59)

## 2021-12-04 LAB — LIPID PANEL W/O CHOL/HDL RATIO
Cholesterol, Total: 119 mg/dL (ref 100–199)
HDL: 50 mg/dL (ref >39)
LDL Chol Calc (NIH): 34 mg/dL (ref 0–99)
Triglycerides: 225 mg/dL — ABNORMAL HIGH (ref 0–149)
VLDL Cholesterol Cal: 35 mg/dL (ref 5–40)

## 2021-12-04 LAB — CBC WITH DIFFERENTIAL/PLATELET
Basophils Absolute: 0 10*3/uL (ref 0.0–0.2)
Basos: 0 %
EOS (ABSOLUTE): 0.1 10*3/uL (ref 0.0–0.4)
Eos: 1 %
Hematocrit: 49.6 % (ref 37.5–51.0)
Hemoglobin: 16.5 g/dL (ref 13.0–17.7)
Immature Grans (Abs): 0 10*3/uL (ref 0.0–0.1)
Immature Granulocytes: 1 %
Lymphocytes Absolute: 1.7 10*3/uL (ref 0.7–3.1)
Lymphs: 19 %
MCH: 31.2 pg (ref 26.6–33.0)
MCHC: 33.3 g/dL (ref 31.5–35.7)
MCV: 94 fL (ref 79–97)
Monocytes Absolute: 0.8 10*3/uL (ref 0.1–0.9)
Monocytes: 9 %
Neutrophils Absolute: 6.2 10*3/uL (ref 1.4–7.0)
Neutrophils: 70 %
Platelets: 159 10*3/uL (ref 150–450)
RBC: 5.29 x10E6/uL (ref 4.14–5.80)
RDW: 13.7 % (ref 11.6–15.4)
WBC: 8.8 10*3/uL (ref 3.4–10.8)

## 2021-12-04 LAB — TSH: TSH: 2.24 u[IU]/mL (ref 0.450–4.500)

## 2021-12-04 LAB — PSA: Prostate Specific Ag, Serum: 3.6 ng/mL (ref 0.0–4.0)

## 2021-12-06 LAB — HM DIABETES EYE EXAM

## 2021-12-10 ENCOUNTER — Telehealth: Payer: Self-pay

## 2021-12-10 NOTE — Telephone Encounter (Signed)
Diabetic Eye Exam was abstracted in patient's chart and report is in provider's folder.

## 2021-12-10 NOTE — Telephone Encounter (Signed)
Spoke with Pamala Hurry from [facility] to have most recent eye exam to be faxed. Pamala Hurry said she has faxed the request over last week for the patient. Pamala Hurry says she will re-faxed it to our office.

## 2021-12-10 NOTE — Telephone Encounter (Signed)
-----   Message from Valerie Roys, DO sent at 12/03/2021  9:52 AM EST ----- Call to get eye exam from [place] in [facility]

## 2022-03-04 HISTORY — PX: CATARACT EXTRACTION W/ INTRAOCULAR LENS IMPLANT: SHX1309

## 2022-03-04 HISTORY — PX: CATARACT EXTRACTION: SUR2

## 2022-03-05 ENCOUNTER — Telehealth: Payer: Self-pay

## 2022-03-05 ENCOUNTER — Ambulatory Visit (INDEPENDENT_AMBULATORY_CARE_PROVIDER_SITE_OTHER): Payer: 59 | Admitting: Family Medicine

## 2022-03-05 ENCOUNTER — Ambulatory Visit: Payer: 59 | Admitting: Family Medicine

## 2022-03-05 ENCOUNTER — Encounter: Payer: Self-pay | Admitting: Family Medicine

## 2022-03-05 VITALS — BP 122/69 | HR 71 | Temp 98.2°F | Ht 72.5 in | Wt 166.5 lb

## 2022-03-05 DIAGNOSIS — Z23 Encounter for immunization: Secondary | ICD-10-CM

## 2022-03-05 DIAGNOSIS — E1169 Type 2 diabetes mellitus with other specified complication: Secondary | ICD-10-CM | POA: Diagnosis not present

## 2022-03-05 DIAGNOSIS — E1151 Type 2 diabetes mellitus with diabetic peripheral angiopathy without gangrene: Secondary | ICD-10-CM

## 2022-03-05 LAB — HM DIABETES EYE EXAM

## 2022-03-05 MED ORDER — GLUCOSE BLOOD VI STRP: ORAL_STRIP | 12 refills | Status: DC

## 2022-03-05 MED ORDER — ONETOUCH ULTRASOFT LANCETS MISC: 12 refills | Status: DC

## 2022-03-05 MED ORDER — ONETOUCH ULTRA 2 W/DEVICE KIT: PACK | 0 refills | Status: DC

## 2022-03-05 NOTE — Progress Notes (Signed)
BP 122/69   Pulse 71   Temp 98.2 F (36.8 C) (Oral)   Ht 6' 0.5" (1.842 m)   Wt 166 lb 8 oz (75.5 kg)   SpO2 97%   BMI 22.27 kg/m    Subjective:    Patient ID: Maxwell Young, male    DOB: [birth year], 67 y.o.   MRN: [id number]  HPI: Maxwell Young is a 67 y.o. male  Chief Complaint  Patient presents with   Diabetes   DIABETES Hypoglycemic episodes:no Polydipsia/polyuria: yes Visual disturbance: no Chest pain: no Paresthesias: no Glucose Monitoring: no  Accucheck frequency: Not Checking Taking Insulin?: no Blood Pressure Monitoring: not checking Retinal Examination: Up to Date Foot Exam: Up to Date Diabetic Education: Completed Pneumovax: Up to Date Influenza: Up to Date Aspirin: no   Relevant past medical, surgical, family and social history reviewed and updated as indicated. Interim medical history since our last visit reviewed. Allergies and medications reviewed and updated.  Review of Systems  Constitutional: Negative.   Respiratory: Negative.    Cardiovascular: Negative.   Gastrointestinal: Negative.   Musculoskeletal: Negative.   Neurological: Negative.   Psychiatric/Behavioral: Negative.      Per HPI unless specifically indicated above     Objective:    BP 122/69   Pulse 71   Temp 98.2 F (36.8 C) (Oral)   Ht 6' 0.5" (1.842 m)   Wt 166 lb 8 oz (75.5 kg)   SpO2 97%   BMI 22.27 kg/m   Wt Readings from Last 3 Encounters:  03/05/22 166 lb 8 oz (75.5 kg)  12/03/21 178 lb 4.8 oz (80.9 kg)  08/31/21 175 lb 4.8 oz (79.5 kg)    Physical Exam Vitals and nursing note reviewed.  Constitutional:      General: He is not in acute distress.    Appearance: Normal appearance. He is normal weight. He is not ill-appearing, toxic-appearing or diaphoretic.  HENT:     Head: Normocephalic and atraumatic.     Right Ear: External ear normal.     Left Ear: External ear normal.     Nose: Nose normal.     Mouth/Throat:     Mouth: Mucous membranes are  moist.     Pharynx: Oropharynx is clear.  Eyes:     General: No scleral icterus.       Right eye: No discharge.        Left eye: No discharge.     Extraocular Movements: Extraocular movements intact.     Conjunctiva/sclera: Conjunctivae normal.     Pupils: Pupils are equal, round, and reactive to light.  Cardiovascular:     Rate and Rhythm: Normal rate and regular rhythm.     Pulses: Normal pulses.     Heart sounds: Normal heart sounds. No murmur heard.    No friction rub. No gallop.  Pulmonary:     Effort: Pulmonary effort is normal. No respiratory distress.     Breath sounds: Normal breath sounds. No stridor. No wheezing, rhonchi or rales.  Chest:     Chest wall: No tenderness.  Musculoskeletal:        General: Normal range of motion.     Cervical back: Normal range of motion and neck supple.  Skin:    General: Skin is warm and dry.     Capillary Refill: Capillary refill takes less than 2 seconds.     Coloration: Skin is not jaundiced or pale.     Findings: No bruising, erythema, lesion  or rash.  Neurological:     General: No focal deficit present.     Mental Status: He is alert and oriented to person, place, and time. Mental status is at baseline.  Psychiatric:        Mood and Affect: Mood normal.        Behavior: Behavior normal.        Thought Content: Thought content normal.        Judgment: Judgment normal.     Results for orders placed or performed in visit on 12/07/21  HM DIABETES EYE EXAM  Result Value Ref Range   HM Diabetic Eye Exam No Retinopathy No Retinopathy      Assessment & Plan:   Problem List Items Addressed This Visit       Endocrine   Type 2 diabetes mellitus with other specified complication ([place])    Will recheck A1c today. Await results. Treat as needed. Call with any concerns.       Relevant Orders   Hgb A1c w/o eAG   Other Visit Diagnoses     Need for shingles vaccine    -  Primary   Relevant Orders   Zoster Recombinant (Shingrix  ) (Completed)        Follow up plan: Return in about 3 months (around 06/03/2022).

## 2022-03-05 NOTE — Assessment & Plan Note (Signed)
Will recheck A1c today. Await results. Treat as needed. Call with any concerns.

## 2022-03-05 NOTE — Telephone Encounter (Signed)
Diabetic Supplies have been ordered and sent to patient pharmacy per Dr.Johnson's request.

## 2022-03-05 NOTE — Telephone Encounter (Signed)
-----   Message from Valerie Roys, [place] sent at 03/05/2022  2:54 PM EST ----- Diabetic testing supplies and monitor please

## 2022-03-06 LAB — HGB A1C W/O EAG: Hgb A1c MFr Bld: 6.4 % — ABNORMAL HIGH (ref 4.8–5.6)

## 2022-03-07 ENCOUNTER — Other Ambulatory Visit: Payer: Self-pay | Admitting: Family Medicine

## 2022-03-07 DIAGNOSIS — I10 Essential (primary) hypertension: Secondary | ICD-10-CM

## 2022-03-08 NOTE — Telephone Encounter (Signed)
Requested Prescriptions  Pending Prescriptions Disp Refills   tamsulosin (FLOMAX) 0.4 MG CAPS capsule [Pharmacy Med Name: Tamsulosin HCl 0.4 MG Oral Capsule] 180 capsule 3    Sig: TAKE 2 CAPSULES BY MOUTH DAILY     Urology: Alpha-Adrenergic Blocker Passed - 03/07/2022  6:04 PM      Passed - PSA in normal range and within 360 days    Prostate Specific Ag, Serum  Date Value Ref Range Status  12/03/2021 3.6 0.0 - 4.0 ng/mL Final    Comment:    Roche ECLIA methodology. According to the American Urological Association, Serum PSA should decrease and remain at undetectable levels after radical prostatectomy. The AUA defines biochemical recurrence as an initial PSA value 0.2 ng/mL or greater followed by a subsequent confirmatory PSA value 0.2 ng/mL or greater. Values obtained with different assay methods or kits cannot be used interchangeably. Results cannot be interpreted as absolute evidence of the presence or absence of malignant disease.          Passed - Last BP in normal range    BP Readings from Last 1 Encounters:  03/05/22 122/69         Passed - Valid encounter within last 12 months    Recent Outpatient Visits           3 days ago Type 2 diabetes mellitus with other specified complication, without long-term current use of insulin ([place])   [place], Megan P, DO   3 months ago Routine general medical examination at a health care facility   [facility], [place] P, DO   6 months ago Type 2 diabetes mellitus with diabetic peripheral angiopathy without gangrene, without long-term current use of insulin ([place])   [place], Megan P, DO   9 months ago Controlled type 2 diabetes mellitus without complication, without long-term current use of insulin ([place])   [place], Megan P, DO   1 year ago Controlled type 2 diabetes mellitus without complication,  without long-term current use of insulin ([place])   [place], Megan P, DO               benazepril (LOTENSIN) 40 MG tablet [Pharmacy Med Name: Benazepril HCl 40 MG Oral Tablet] 90 tablet 3    Sig: TAKE 1 TABLET BY MOUTH  DAILY     Cardiovascular:  ACE Inhibitors Passed - 03/07/2022  6:04 PM      Passed - Cr in normal range and within 180 days    Creatinine, Ser  Date Value Ref Range Status  12/03/2021 0.91 0.76 - 1.27 mg/dL Final         Passed - K in normal range and within 180 days    Potassium  Date Value Ref Range Status  12/03/2021 4.4 3.5 - 5.2 mmol/L Final         Passed - Patient is not pregnant      Passed - Last BP in normal range    BP Readings from Last 1 Encounters:  03/05/22 122/69         Passed - Valid encounter within last 6 months    Recent Outpatient Visits           3 days ago Type 2 diabetes mellitus with other specified complication, without long-term current use of insulin [facility])   [place], [place], DO  3 months ago Routine general medical examination at a health care facility   [facility], [place] P, DO   6 months ago Type 2 diabetes mellitus with diabetic peripheral angiopathy without gangrene, without long-term current use of insulin ([place])   [place], Megan P, DO   9 months ago Controlled type 2 diabetes mellitus without complication, without long-term current use of insulin ([place])   [place], Megan P, DO   1 year ago Controlled type 2 diabetes mellitus without complication, without long-term current use of insulin [facility])   [place], [place], DO

## 2022-03-11 ENCOUNTER — Other Ambulatory Visit: Payer: Self-pay

## 2022-03-11 DIAGNOSIS — E1169 Type 2 diabetes mellitus with other specified complication: Secondary | ICD-10-CM

## 2022-03-11 DIAGNOSIS — E1151 Type 2 diabetes mellitus with diabetic peripheral angiopathy without gangrene: Secondary | ICD-10-CM

## 2022-03-11 MED ORDER — GLUCOSE BLOOD VI STRP: ORAL_STRIP | 12 refills | Status: DC

## 2022-03-11 MED ORDER — ONETOUCH ULTRASOFT LANCETS MISC: 12 refills | Status: DC

## 2022-03-14 ENCOUNTER — Ambulatory Visit
Admission: RE | Admit: 2022-03-14 | Discharge: 2022-03-14 | Disposition: A | Discharge status: Home / Self Care | Payer: 59 | Source: Ambulatory Visit | Attending: Acute Care | Admitting: Acute Care

## 2022-03-14 DIAGNOSIS — F17210 Nicotine dependence, cigarettes, uncomplicated: Secondary | ICD-10-CM | POA: Insufficient documentation

## 2022-03-14 DIAGNOSIS — Z87891 Personal history of nicotine dependence: Secondary | ICD-10-CM | POA: Diagnosis not present

## 2022-03-18 ENCOUNTER — Other Ambulatory Visit: Payer: Self-pay

## 2022-03-18 DIAGNOSIS — Z122 Encounter for screening for malignant neoplasm of respiratory organs: Secondary | ICD-10-CM

## 2022-03-18 DIAGNOSIS — Z87891 Personal history of nicotine dependence: Secondary | ICD-10-CM

## 2022-03-18 DIAGNOSIS — E1169 Type 2 diabetes mellitus with other specified complication: Secondary | ICD-10-CM

## 2022-03-18 MED ORDER — CONTOUR NEXT ONE KIT: 1.0000 | PACK | Freq: Two times a day (BID) | 12 refills | Status: DC

## 2022-04-02 IMAGING — CT CT CHEST LUNG CANCER SCREENING LOW DOSE W/O CM
2 of 5 series · 15 of 40 positions shown, 18 images · non-contrast
Comparison: 07/13/2018

CLINICAL DATA: 64-year-old male with 44 pack-year history of
smoking. Lung cancer screening.

EXAM:
CT CHEST WITHOUT CONTRAST LOW-DOSE FOR LUNG CANCER SCREENING
TECHNIQUE: Multidetector CT imaging of the chest was performed following the
standard protocol without IV contrast.

[Series 3: lung 1.00 · axial · 0.75mm/px · z∈[-1245,-897]mm · 12 of 386 slices shown, 15 images]
[im 19/386  mediastinal]
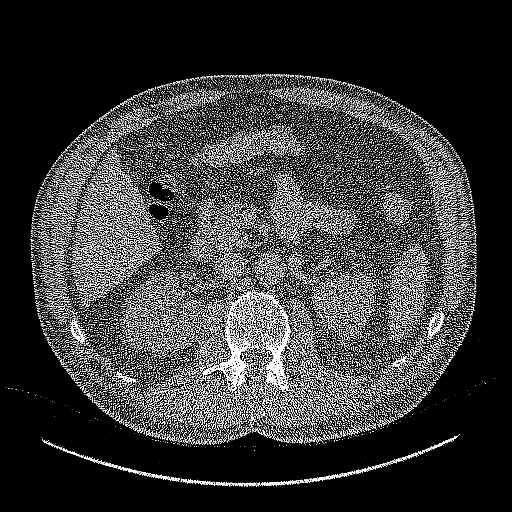
[im 19/386  lung]
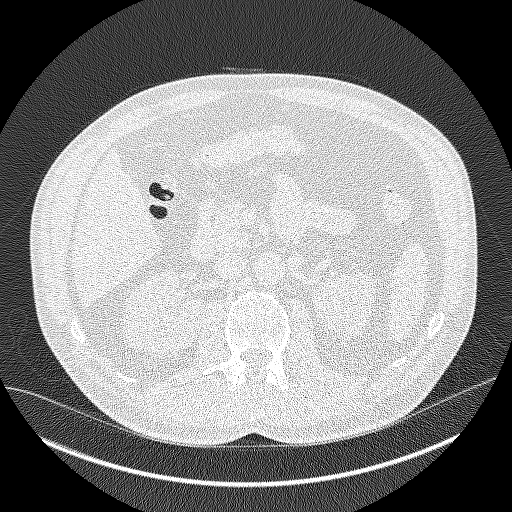
[im 56/386  lung]
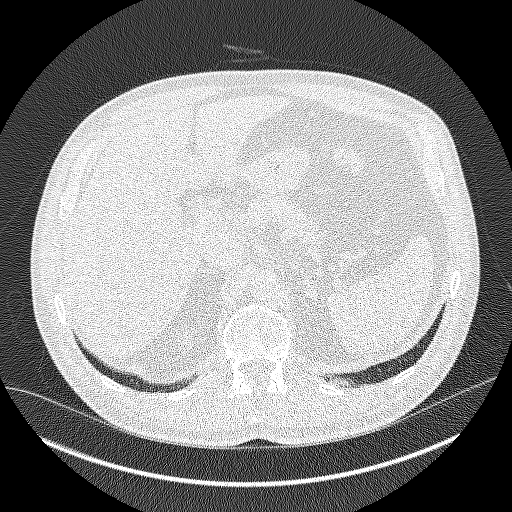
[im 92/386  lung]
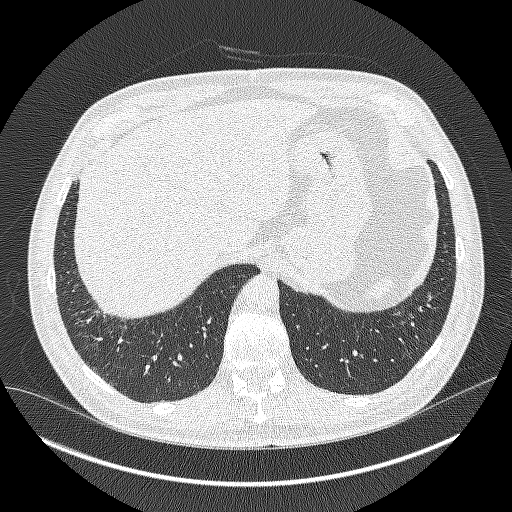
[im 111/386  lung]
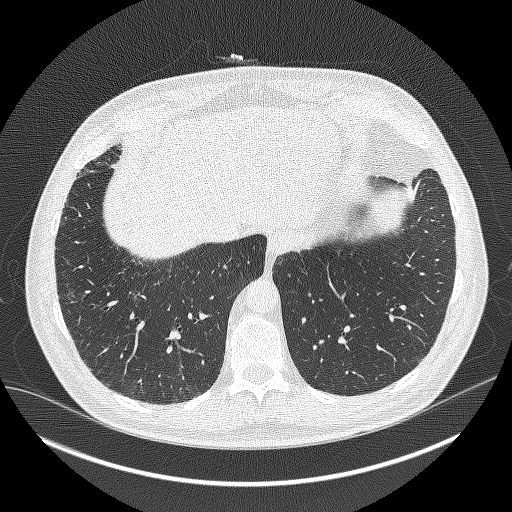
[im 147/386  mediastinal]
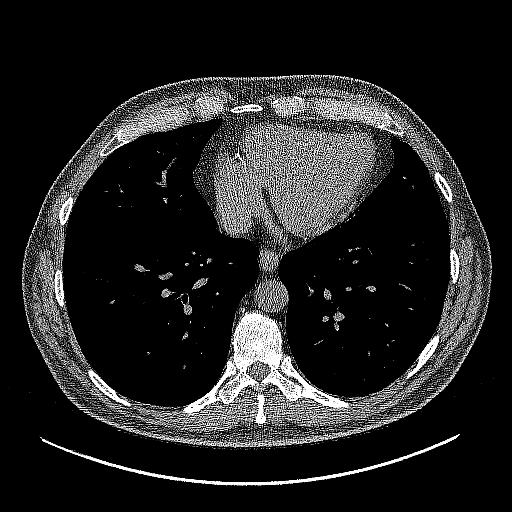
[im 147/386  lung]
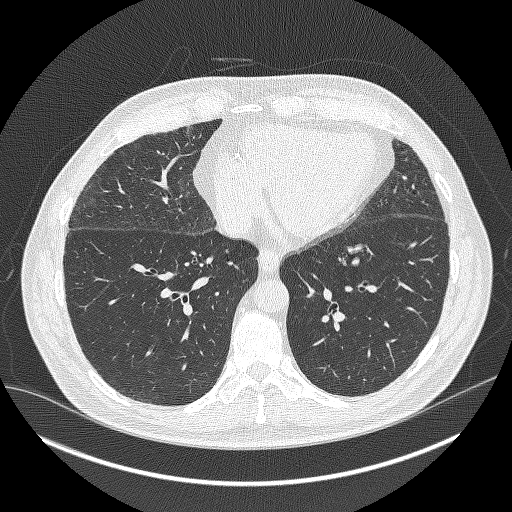
[im 184/386  lung]
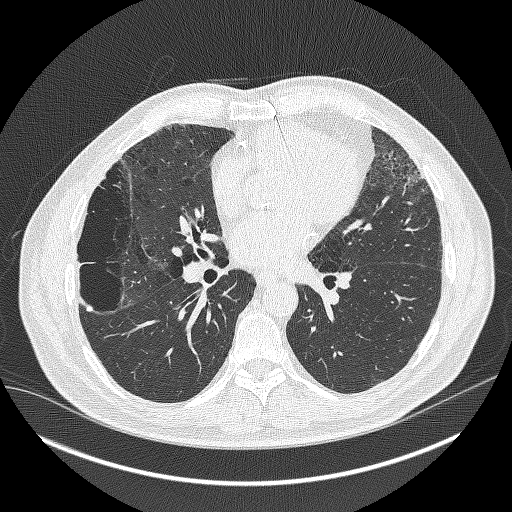
[im 202/386  lung]
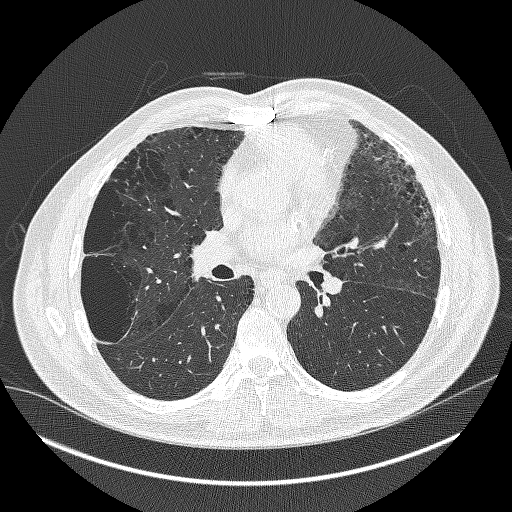
[im 239/386  lung]
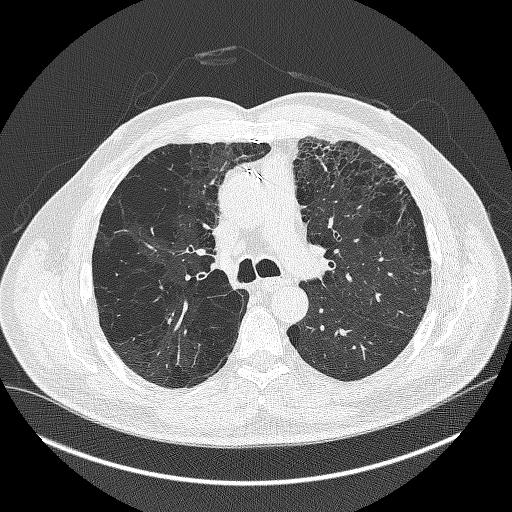
[im 276/386  mediastinal]
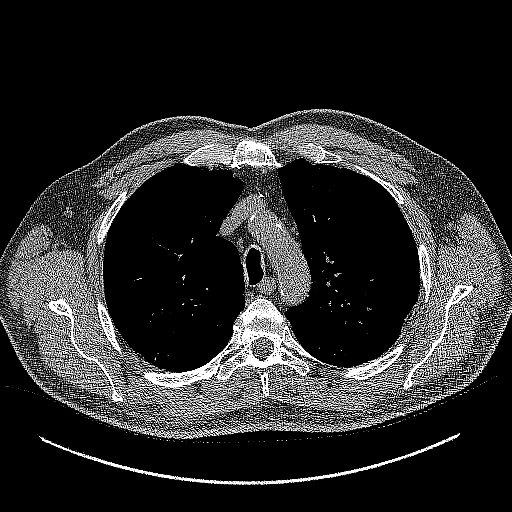
[im 276/386  lung]
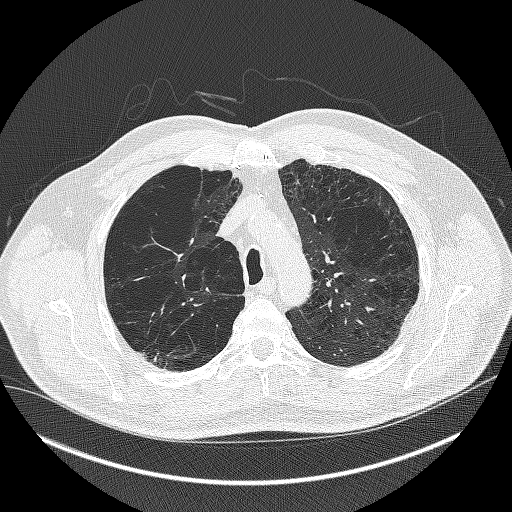
[im 294/386  lung]
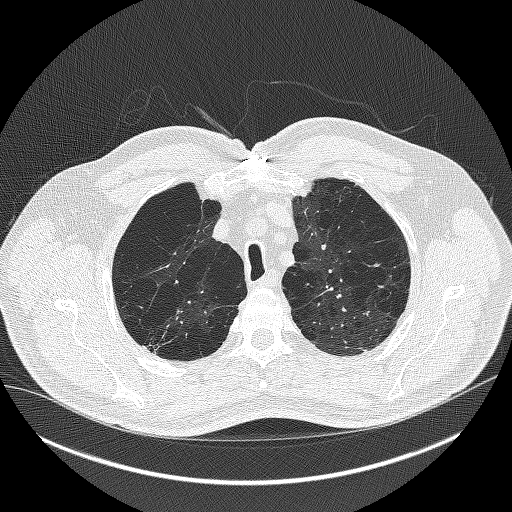
[im 331/386  lung]
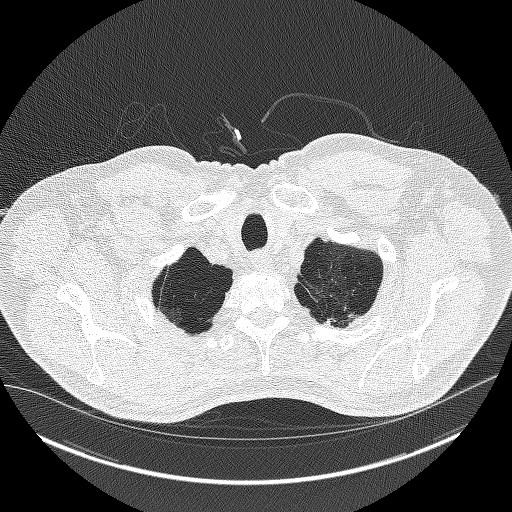
[im 367/386  lung]
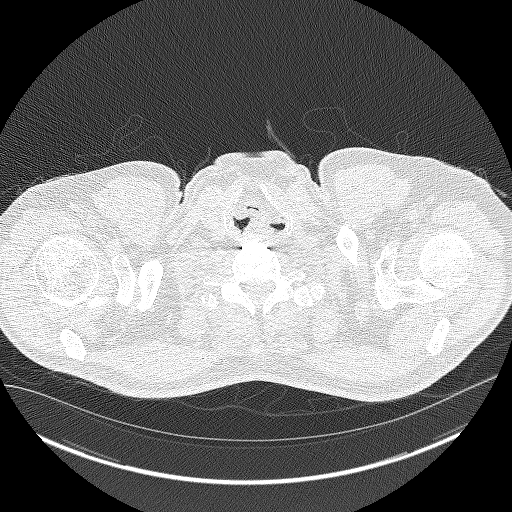

[Series 4: coronals lung 1.00 cor · coronal · 0.75mm/px · 3 of 354 slices shown]
[im 71/354  lung]
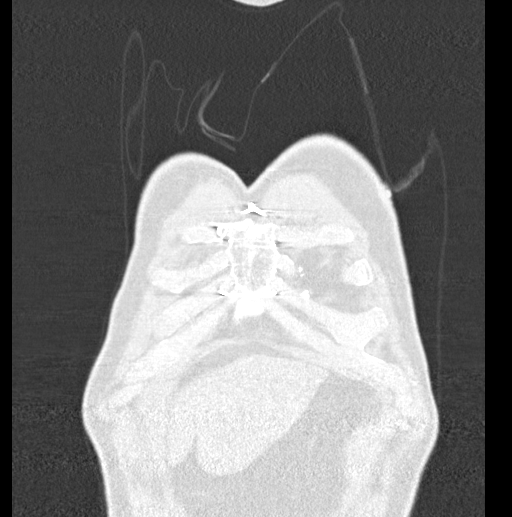
[im 142/354  lung]
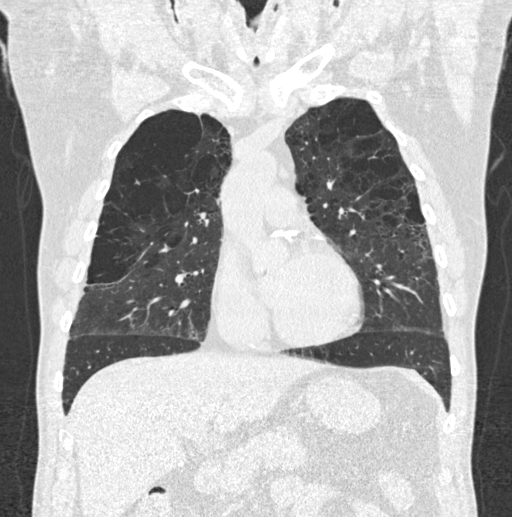
[im 212/354  lung]
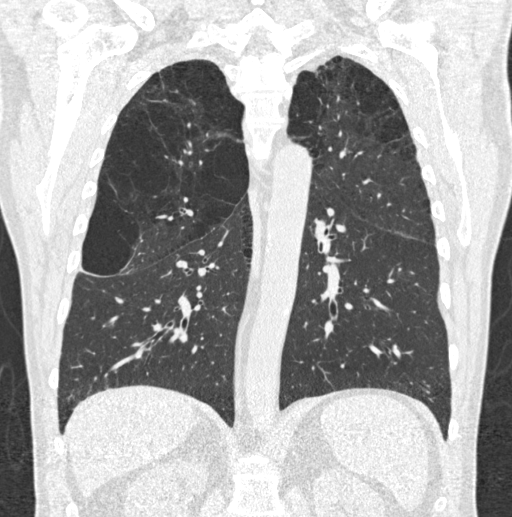

[15 of 40 positions shown; findings below may reference images not displayed]

FINDINGS: Cardiovascular: The heart size is normal. No substantial pericardial
effusion. Coronary artery calcification is evident. Status post
CABG. Atherosclerotic calcification is noted in the wall of the
thoracic aorta.

Mediastinum/Nodes: No mediastinal lymphadenopathy. No evidence for
gross hilar lymphadenopathy although assessment is limited by the
lack of intravenous contrast on today's study. The esophagus has
normal imaging features. There is no axillary lymphadenopathy.

Lungs/Pleura: Centrilobular and paraseptal emphysema evident.
Bullous change noted in the lung apices. Previously identified
scattered tiny calcified and noncalcified pulmonary nodules are
stable. Small cluster of tiny ill-defined nodules in the posterior
left costophrenic sulcus identified with individual nodules
measuring up to maximum volume derived equivalent diameter of No new
suspicious pulmonary nodule or mass. approximately 3 mm. No pleural
effusion.

Upper Abdomen: Tiny calcified gallstones evident.

Musculoskeletal: No worrisome lytic or sclerotic osseous
abnormality.
IMPRESSION: 1. Lung-RADS 2, benign appearance or behavior. Continue annual
screening with low-dose chest CT without contrast in 12 months.
2. Interval development of a small cluster of tiny ill-defined
nodules in the posterior left costophrenic sulcus. This is most
likely related to atypical infection.
3. Cholelithiasis.
4. Aortic Atherosclerosis (A2N65-RMR.R).

## 2022-05-05 ENCOUNTER — Other Ambulatory Visit: Payer: Self-pay | Admitting: Family Medicine

## 2022-05-06 NOTE — Telephone Encounter (Signed)
Requested Prescriptions  Pending Prescriptions Disp Refills   amLODipine (NORVASC) 10 MG tablet [Pharmacy Med Name: amLODIPine Besylate 10 MG Oral Tablet] 90 tablet 3    Sig: TAKE 1 TABLET BY MOUTH DAILY     Cardiovascular: Calcium Channel Blockers 2 Passed - 05/05/2022  5:59 AM      Passed - Last BP in normal range    BP Readings from Last 1 Encounters:  03/05/22 122/69         Passed - Last Heart Rate in normal range    Pulse Readings from Last 1 Encounters:  03/05/22 71         Passed - Valid encounter within last 6 months    Recent Outpatient Visits           2 months ago Type 2 diabetes mellitus with other specified complication, without long-term current use of insulin (HCC)   [place] [facility] [place], Megan P, DO   5 months ago Routine general medical examination at a health care facility   [facility], [place] P, DO   8 months ago Type 2 diabetes mellitus with diabetic peripheral angiopathy without gangrene, without long-term current use of insulin (HCC)   [place] [facility], Megan P, DO   11 months ago Controlled type 2 diabetes mellitus without complication, without long-term current use of insulin (HCC)   [place] [facility] [place], Megan P, DO   1 year ago Controlled type 2 diabetes mellitus without complication, without long-term current use of insulin (HCC)   [facility] [place], Megan P, DO               tamsulosin (FLOMAX) 0.4 MG CAPS capsule [Pharmacy Med Name: Tamsulosin HCl 0.4 MG Oral Capsule] 180 capsule 3    Sig: TAKE 2 CAPSULES BY MOUTH DAILY     Urology: Alpha-Adrenergic Blocker Passed - 05/05/2022  5:59 AM      Passed - PSA in normal range and within 360 days    Prostate Specific Ag, Serum  Date Value Ref Range Status  12/03/2021 3.6 0.0 - 4.0 ng/mL Final    Comment:    Roche ECLIA methodology. According to the American  Urological Association, Serum PSA should decrease and remain at undetectable levels after radical prostatectomy. The AUA defines biochemical recurrence as an initial PSA value 0.2 ng/mL or greater followed by a subsequent confirmatory PSA value 0.2 ng/mL or greater. Values obtained with different assay methods or kits cannot be used interchangeably. Results cannot be interpreted as absolute evidence of the presence or absence of malignant disease.          Passed - Last BP in normal range    BP Readings from Last 1 Encounters:  03/05/22 122/69         Passed - Valid encounter within last 12 months    Recent Outpatient Visits           2 months ago Type 2 diabetes mellitus with other specified complication, without long-term current use of insulin (HCC)   [place] [facility] [place], Megan P, DO   5 months ago Routine general medical examination at a health care facility   [facility], [place] P, DO   8 months ago Type 2 diabetes mellitus with diabetic peripheral angiopathy without gangrene, without long-term current use of insulin [facility])   [place] [place], [place],  DO   11 months ago Controlled type 2 diabetes mellitus without complication, without long-term current use of insulin (HCC)   [place] [facility] [place], Megan P, DO   1 year ago Controlled type 2 diabetes mellitus without complication, without long-term current use of insulin [facility])   [facility] [place], [place], DO

## 2022-06-04 ENCOUNTER — Telehealth: Payer: Self-pay | Admitting: Family Medicine

## 2022-06-04 DIAGNOSIS — E7800 Pure hypercholesterolemia, unspecified: Secondary | ICD-10-CM

## 2022-06-04 DIAGNOSIS — I10 Essential (primary) hypertension: Secondary | ICD-10-CM

## 2022-06-04 DIAGNOSIS — E1151 Type 2 diabetes mellitus with diabetic peripheral angiopathy without gangrene: Secondary | ICD-10-CM

## 2022-06-04 DIAGNOSIS — E1169 Type 2 diabetes mellitus with other specified complication: Secondary | ICD-10-CM

## 2022-06-04 NOTE — Telephone Encounter (Signed)
Patient called stated he now has to get all his meds thru  [facility] - [place], [place] - [address] Phone: [phone]  Fax: [phone]    Patient needs to make sure all his prescriptions are sent to that pharmacy

## 2022-06-04 NOTE — Telephone Encounter (Signed)
Patient called to verify if refills are needed, no answer, mailbox is full.

## 2022-06-05 NOTE — Telephone Encounter (Signed)
Called patient to verify which refills were needed and to confirm pharmacy change. No answer, unable to left VM.

## 2022-06-10 NOTE — Telephone Encounter (Signed)
Due for follow up

## 2022-06-10 NOTE — Telephone Encounter (Signed)
Pt called back saying he needs all of his medications refilled .  He needs to get them sent to [facility] because of insurace change.  CB#  [phone]

## 2022-06-10 NOTE — Telephone Encounter (Signed)
Called and scheduled appointment for 06/11/2022 @ 3:20 pm.

## 2022-06-11 ENCOUNTER — Encounter: Payer: Self-pay | Admitting: Family Medicine

## 2022-06-11 ENCOUNTER — Ambulatory Visit (INDEPENDENT_AMBULATORY_CARE_PROVIDER_SITE_OTHER): Payer: Medicare Other | Admitting: Family Medicine

## 2022-06-11 VITALS — BP 104/65 | HR 67 | Temp 98.3°F | Ht 72.5 in | Wt 167.6 lb

## 2022-06-11 DIAGNOSIS — K4091 Unilateral inguinal hernia, without obstruction or gangrene, recurrent: Secondary | ICD-10-CM

## 2022-06-11 DIAGNOSIS — Z23 Encounter for immunization: Secondary | ICD-10-CM

## 2022-06-11 DIAGNOSIS — E7800 Pure hypercholesterolemia, unspecified: Secondary | ICD-10-CM

## 2022-06-11 DIAGNOSIS — I2510 Atherosclerotic heart disease of native coronary artery without angina pectoris: Secondary | ICD-10-CM | POA: Diagnosis not present

## 2022-06-11 DIAGNOSIS — I739 Peripheral vascular disease, unspecified: Secondary | ICD-10-CM

## 2022-06-11 DIAGNOSIS — I10 Essential (primary) hypertension: Secondary | ICD-10-CM | POA: Diagnosis not present

## 2022-06-11 DIAGNOSIS — E1169 Type 2 diabetes mellitus with other specified complication: Secondary | ICD-10-CM

## 2022-06-11 DIAGNOSIS — I700 Atherosclerosis of aorta: Secondary | ICD-10-CM

## 2022-06-11 DIAGNOSIS — N401 Enlarged prostate with lower urinary tract symptoms: Secondary | ICD-10-CM

## 2022-06-11 DIAGNOSIS — J439 Emphysema, unspecified: Secondary | ICD-10-CM

## 2022-06-11 LAB — URINALYSIS, ROUTINE W REFLEX MICROSCOPIC
Bilirubin, UA: NEGATIVE
Glucose, UA: NEGATIVE
Ketones, UA: NEGATIVE
Leukocytes,UA: NEGATIVE
Nitrite, UA: NEGATIVE
Protein,UA: NEGATIVE
RBC, UA: NEGATIVE
Specific Gravity, UA: 1.01 (ref 1.005–1.030)
Urobilinogen, Ur: 0.2 mg/dL (ref 0.2–1.0)
pH, UA: 6.5 (ref 5.0–7.5)

## 2022-06-11 LAB — MICROALBUMIN, URINE WAIVED
Creatinine, Urine Waived: 10 mg/dL (ref 10–300)
Microalb, Ur Waived: 10 mg/L (ref 0–19)

## 2022-06-11 LAB — BAYER DCA HB A1C WAIVED: HB A1C (BAYER DCA - WAIVED): 5.6 % (ref 4.8–5.6)

## 2022-06-11 MED ORDER — ROSUVASTATIN CALCIUM 40 MG PO TABS: 40.0000 mg | ORAL_TABLET | Freq: Every day | ORAL | 3 refills | Status: DC

## 2022-06-11 MED ORDER — COLESEVELAM HCL 625 MG PO TABS: 1875.0000 mg | ORAL_TABLET | Freq: Two times a day (BID) | ORAL | 3 refills | Status: DC

## 2022-06-11 MED ORDER — METOPROLOL SUCCINATE ER 100 MG PO TB24: 100.0000 mg | ORAL_TABLET | Freq: Every day | ORAL | 1 refills | Status: DC

## 2022-06-11 MED ORDER — METFORMIN HCL 500 MG PO TABS: 1000.0000 mg | ORAL_TABLET | Freq: Every day | ORAL | 1 refills | Status: DC

## 2022-06-11 MED ORDER — AMLODIPINE BESYLATE 10 MG PO TABS: 10.0000 mg | ORAL_TABLET | Freq: Every day | ORAL | 1 refills | Status: DC

## 2022-06-11 MED ORDER — TAMSULOSIN HCL 0.4 MG PO CAPS: 0.8000 mg | ORAL_CAPSULE | Freq: Every day | ORAL | 1 refills | Status: DC

## 2022-06-11 MED ORDER — OMEGA-3-ACID ETHYL ESTERS 1 G PO CAPS: ORAL_CAPSULE | ORAL | 1 refills | Status: DC

## 2022-06-11 NOTE — Assessment & Plan Note (Signed)
Will keep BP and cholesterol under good control. Continue to monitor. Call with any concerns. Continue to follow with cardiology.  

## 2022-06-11 NOTE — Assessment & Plan Note (Signed)
Under good control on current regimen. Continue current regimen. Continue to monitor. Call with any concerns. Refills given. Labs drawn today.   

## 2022-06-11 NOTE — Assessment & Plan Note (Signed)
Doing great with A1c of 5.6. Will cut his metformin down to 1000mg  1x a day. Recheck 3 months. Call with any concerns.

## 2022-06-11 NOTE — Assessment & Plan Note (Signed)
Stable. Continue to monitor. Call with any concerns.  ?

## 2022-06-11 NOTE — Assessment & Plan Note (Signed)
Will keep BP and cholesterol under good control. Continue to monitor. Call with any concerns.  

## 2022-06-11 NOTE — Progress Notes (Signed)
BP 104/65   Pulse 67   Temp 98.3 F (36.8 C) (Oral)   Ht 6' 0.5" (1.842 m)   Wt 167 lb 9.6 oz (76 kg)   SpO2 97%   BMI 22.42 kg/m    Subjective:    Patient ID: Maxwell Young, male    DOB: Feb 06, 1955, 67 y.o.   MRN: [id number]  HPI: Maxwell Young is a 67 y.o. male  Chief Complaint  Patient presents with   Diabetes   Hypertension   DIABETES Hypoglycemic episodes:no Polydipsia/polyuria: no Visual disturbance: no Chest pain: no Paresthesias: no Glucose Monitoring: yes  Accucheck frequency: occasionally Taking Insulin?: no Blood Pressure Monitoring: not checking Retinal Examination: Up to Date Foot Exam: Up to Date Diabetic Education: Completed Pneumovax: Up to Date Influenza: Up to Date Aspirin: yes  HYPERTENSION / HYPERLIPIDEMIA Satisfied with current treatment? yes Duration of hypertension: chronic BP monitoring frequency: not checking BP range:  BP medication side effects: no Past BP meds: amlodipine, metoprolol Duration of hyperlipidemia: chronic Cholesterol medication side effects: no Cholesterol supplements: fish oil Past cholesterol medications: crestor Medication compliance: excellent compliance Aspirin: yes Recent stressors: no Recurrent headaches: no Visual changes: no Palpitations: no Dyspnea: no Chest pain: no Lower extremity edema: no Dizzy/lightheaded: no  BPH BPH status: controlled Satisfied with current treatment?: yes Medication side effects: no Medication compliance: excellent compliance Duration: chronic Nocturia: 1/night Urinary frequency:no Incomplete voiding: no Urgency: no Weak urinary stream: no Straining to start stream: no Dysuria: no Onset: gradual Severity: mild   Relevant past medical, surgical, family and social history reviewed and updated as indicated. Interim medical history since our last visit reviewed. Allergies and medications reviewed and updated.  Review of Systems  Constitutional: Negative.    Respiratory: Negative.    Cardiovascular: Negative.   Gastrointestinal: Negative.   Musculoskeletal: Negative.   Neurological: Negative.   Psychiatric/Behavioral: Negative.      Per HPI unless specifically indicated above     Objective:    BP 104/65   Pulse 67   Temp 98.3 F (36.8 C) (Oral)   Ht 6' 0.5" (1.842 m)   Wt 167 lb 9.6 oz (76 kg)   SpO2 97%   BMI 22.42 kg/m   Wt Readings from Last 3 Encounters:  06/11/22 167 lb 9.6 oz (76 kg)  03/05/22 166 lb 8 oz (75.5 kg)  12/03/21 178 lb 4.8 oz (80.9 kg)    Physical Exam Vitals and nursing note reviewed.  Constitutional:      General: He is not in acute distress.    Appearance: Normal appearance. He is not ill-appearing, toxic-appearing or diaphoretic.  HENT:     Head: Normocephalic and atraumatic.     Right Ear: External ear normal.     Left Ear: External ear normal.     Nose: Nose normal.     Mouth/Throat:     Mouth: Mucous membranes are moist.     Pharynx: Oropharynx is clear.  Eyes:     General: No scleral icterus.       Right eye: No discharge.        Left eye: No discharge.     Extraocular Movements: Extraocular movements intact.     Conjunctiva/sclera: Conjunctivae normal.     Pupils: Pupils are equal, round, and reactive to light.  Cardiovascular:     Rate and Rhythm: Normal rate and regular rhythm.     Pulses: Normal pulses.     Heart sounds: Normal heart sounds. No murmur heard.  No friction rub. No gallop.  Pulmonary:     Effort: Pulmonary effort is normal. No respiratory distress.     Breath sounds: Normal breath sounds. No stridor. No wheezing, rhonchi or rales.  Chest:     Chest wall: No tenderness.  Abdominal:     General: Abdomen is flat. Bowel sounds are normal. There is no distension.     Palpations: Abdomen is soft. There is no mass.     Tenderness: There is no abdominal tenderness. There is no right CVA tenderness, left CVA tenderness, guarding or rebound.     Hernia: A hernia (LLQ) is  present.  Musculoskeletal:        General: Normal range of motion.     Cervical back: Normal range of motion and neck supple.  Skin:    General: Skin is warm and dry.     Capillary Refill: Capillary refill takes less than 2 seconds.     Coloration: Skin is not jaundiced or pale.     Findings: No bruising, erythema, lesion or rash.  Neurological:     General: No focal deficit present.     Mental Status: He is alert and oriented to person, place, and time. Mental status is at baseline.  Psychiatric:        Mood and Affect: Mood normal.        Behavior: Behavior normal.        Thought Content: Thought content normal.        Judgment: Judgment normal.     Results for orders placed or performed in visit on 03/05/22  Hgb A1c w/o eAG  Result Value Ref Range   Hgb A1c MFr Bld 6.4 (H) 4.8 - 5.6 %      Assessment & Plan:   Problem List Items Addressed This Visit       Cardiovascular and Mediastinum   Essential hypertension    Under good control on current regimen. Continue current regimen. Continue to monitor. Call with any concerns. Refills given. Labs drawn today.        Relevant Medications   amLODipine (NORVASC) 10 MG tablet   colesevelam (WELCHOL) 625 MG tablet   metoprolol succinate (TOPROL-XL) 100 MG 24 hr tablet   omega-3 acid ethyl esters (LOVAZA) 1 g capsule   rosuvastatin (CRESTOR) 40 MG tablet   Other Relevant Orders   Comprehensive metabolic panel   CBC with Differential/Platelet   TSH   Urinalysis, Routine w reflex microscopic   Microalbumin, Urine Waived   CAD (coronary artery disease)    Will keep BP and cholesterol under good control. Continue to monitor. Call with any concerns. Continue to follow with cardiology.      Relevant Medications   amLODipine (NORVASC) 10 MG tablet   colesevelam (WELCHOL) 625 MG tablet   metoprolol succinate (TOPROL-XL) 100 MG 24 hr tablet   omega-3 acid ethyl esters (LOVAZA) 1 g capsule   rosuvastatin (CRESTOR) 40 MG tablet    Other Relevant Orders   Comprehensive metabolic panel   CBC with Differential/Platelet   Aortic atherosclerosis (HCC) - Primary    Will keep BP and cholesterol under good control. Continue to monitor. Call with any concerns.       Relevant Medications   amLODipine (NORVASC) 10 MG tablet   colesevelam (WELCHOL) 625 MG tablet   metoprolol succinate (TOPROL-XL) 100 MG 24 hr tablet   omega-3 acid ethyl esters (LOVAZA) 1 g capsule   rosuvastatin (CRESTOR) 40 MG tablet   Other Relevant Orders  Comprehensive metabolic panel   CBC with Differential/Platelet   Lipid Panel w/o Chol/HDL Ratio   PAD (peripheral artery disease) (HCC)    Will keep BP and cholesterol under good control. Continue to monitor. Call with any concerns. Continue to follow with cardiology.      Relevant Medications   amLODipine (NORVASC) 10 MG tablet   colesevelam (WELCHOL) 625 MG tablet   metoprolol succinate (TOPROL-XL) 100 MG 24 hr tablet   omega-3 acid ethyl esters (LOVAZA) 1 g capsule   rosuvastatin (CRESTOR) 40 MG tablet   Other Relevant Orders   Comprehensive metabolic panel   CBC with Differential/Platelet     Respiratory   COPD (chronic obstructive pulmonary disease) with emphysema (HCC)    Stable. Continue to monitor. Call with any concerns.       Relevant Orders   Comprehensive metabolic panel   CBC with Differential/Platelet     Endocrine   Type 2 diabetes mellitus with other specified complication (HCC)    Doing great with A1c of 5.6. Will cut his metformin down to 1000mg  1x a day. Recheck 3 months. Call with any concerns.       Relevant Medications   metFORMIN (GLUCOPHAGE) 500 MG tablet   rosuvastatin (CRESTOR) 40 MG tablet   Other Relevant Orders   Comprehensive metabolic panel   CBC with Differential/Platelet   Urinalysis, Routine w reflex microscopic   Microalbumin, Urine Waived   Bayer DCA Hb A1c Waived     Genitourinary   Benign prostatic hyperplasia with lower urinary tract  symptoms    Under good control on current regimen. Continue current regimen. Continue to monitor. Call with any concerns. Refills given. Labs drawn today.       Relevant Medications   tamsulosin (FLOMAX) 0.4 MG CAPS capsule   Other Relevant Orders   Comprehensive metabolic panel   CBC with Differential/Platelet   PSA     Other   Hyperlipidemia    Under good control on current regimen. Continue current regimen. Continue to monitor. Call with any concerns. Refills given. Labs drawn today.      Relevant Medications   amLODipine (NORVASC) 10 MG tablet   colesevelam (WELCHOL) 625 MG tablet   metoprolol succinate (TOPROL-XL) 100 MG 24 hr tablet   omega-3 acid ethyl esters (LOVAZA) 1 g capsule   rosuvastatin (CRESTOR) 40 MG tablet   Other Relevant Orders   Comprehensive metabolic panel   CBC with Differential/Platelet   Lipid Panel w/o Chol/HDL Ratio   Other Visit Diagnoses     Unilateral recurrent inguinal hernia without obstruction or gangrene       Referral to general surgery placed today. Await their input.   Relevant Orders   Ambulatory referral to General Surgery   Need for vaccination for pneumococcus       Prevnar 20 given today.   Relevant Orders   Pneumococcal conjugate vaccine 20-valent (Prevnar 20) (Completed)        Follow up plan: Return in about 3 months (around 09/11/2022) for Welcome to Medicare.

## 2022-06-12 LAB — CBC WITH DIFFERENTIAL/PLATELET
Basophils Absolute: 0.1 10*3/uL (ref 0.0–0.2)
Basos: 0 %
EOS (ABSOLUTE): 0 10*3/uL (ref 0.0–0.4)
Eos: 0 %
Hematocrit: 47.5 % (ref 37.5–51.0)
Hemoglobin: 16.1 g/dL (ref 13.0–17.7)
Immature Grans (Abs): 0 10*3/uL (ref 0.0–0.1)
Immature Granulocytes: 0 %
Lymphocytes Absolute: 1.7 10*3/uL (ref 0.7–3.1)
Lymphs: 15 %
MCH: 31.6 pg (ref 26.6–33.0)
MCHC: 33.9 g/dL (ref 31.5–35.7)
MCV: 93 fL (ref 79–97)
Monocytes Absolute: 0.8 10*3/uL (ref 0.1–0.9)
Monocytes: 7 %
Neutrophils Absolute: 9 10*3/uL — ABNORMAL HIGH (ref 1.4–7.0)
Neutrophils: 78 %
Platelets: 181 10*3/uL (ref 150–450)
RBC: 5.09 x10E6/uL (ref 4.14–5.80)
RDW: 13.5 % (ref 11.6–15.4)
WBC: 11.7 10*3/uL — ABNORMAL HIGH (ref 3.4–10.8)

## 2022-06-12 LAB — COMPREHENSIVE METABOLIC PANEL
ALT: 28 IU/L (ref 0–44)
AST: 34 IU/L (ref 0–40)
Albumin/Globulin Ratio: 2 (ref 1.2–2.2)
Albumin: 4.9 g/dL (ref 3.9–4.9)
Alkaline Phosphatase: 92 IU/L (ref 44–121)
BUN/Creatinine Ratio: 9 — ABNORMAL LOW (ref 10–24)
BUN: 9 mg/dL (ref 8–27)
Bilirubin Total: 0.4 mg/dL (ref 0.0–1.2)
CO2: 24 mmol/L (ref 20–29)
Calcium: 9.5 mg/dL (ref 8.6–10.2)
Chloride: 101 mmol/L (ref 96–106)
Creatinine, Ser: 1.03 mg/dL (ref 0.76–1.27)
Globulin, Total: 2.4 g/dL (ref 1.5–4.5)
Glucose: 116 mg/dL — ABNORMAL HIGH (ref 70–99)
Potassium: 4.6 mmol/L (ref 3.5–5.2)
Sodium: 140 mmol/L (ref 134–144)
Total Protein: 7.3 g/dL (ref 6.0–8.5)
eGFR: 81 mL/min/{1.73_m2} (ref >59)

## 2022-06-12 LAB — TSH: TSH: 1.72 u[IU]/mL (ref 0.450–4.500)

## 2022-06-12 LAB — PSA: Prostate Specific Ag, Serum: 3.8 ng/mL (ref 0.0–4.0)

## 2022-06-12 LAB — LIPID PANEL W/O CHOL/HDL RATIO
Cholesterol, Total: 112 mg/dL (ref 100–199)
HDL: 58 mg/dL (ref >39)
LDL Chol Calc (NIH): 33 mg/dL (ref 0–99)
Triglycerides: 119 mg/dL (ref 0–149)
VLDL Cholesterol Cal: 21 mg/dL (ref 5–40)

## 2022-06-24 ENCOUNTER — Encounter: Payer: Self-pay | Admitting: Surgery

## 2022-06-24 ENCOUNTER — Ambulatory Visit: Payer: Medicare Other | Admitting: Surgery

## 2022-06-24 VITALS — BP 116/70 | HR 84 | Temp 98.3°F | Ht 72.0 in | Wt 166.2 lb

## 2022-06-24 DIAGNOSIS — K4020 Bilateral inguinal hernia, without obstruction or gangrene, not specified as recurrent: Secondary | ICD-10-CM

## 2022-06-24 NOTE — Patient Instructions (Addendum)
If you have any concerns or questions, please feel free to call our office.   Inguinal Hernia, Adult An inguinal hernia is when fat or your intestines push through a weak spot in a muscle where your leg meets your lower belly (groin). This causes a bulge. This kind of hernia could also be: In your scrotum, if you are male. In folds of skin around your vagina, if you are male. There are three types of inguinal hernias: Hernias that can be pushed back into the belly (are reducible). This type rarely causes pain. Hernias that cannot be pushed back into the belly (are incarcerated). Hernias that cannot be pushed back into the belly and lose their blood supply (are strangulated). This type needs emergency surgery. What are the causes? This condition is caused by having a weak spot in the muscles or tissues in your groin. This develops over time. The hernia may poke through the weak spot when you strain your lower belly muscles all of a sudden, such as when you: Lift a heavy object. Strain to poop (have a bowel movement). Trouble pooping (constipation) can lead to straining. Cough. What increases the risk? This condition is more likely to develop in: Males. Pregnant females. People who: Are overweight. Work in jobs that require long periods of standing or heavy lifting. Have had an inguinal hernia before. Smoke or have lung disease. These factors can lead to long-term (chronic) coughing. What are the signs or symptoms? Symptoms may depend on the size of the hernia. Often, a small hernia has no symptoms. Symptoms of a larger hernia may include: A bulge in the groin area. This is easier to see when standing. You might not be able to see it when you are lying down. Pain or burning in the groin. This may get worse when you lift, strain, or cough. A dull ache or a feeling of pressure in the groin. An abnormal bulge in the scrotum, in males. Symptoms of a strangulated inguinal hernia may  include: A bulge in your groin that is very painful and tender to the touch. A bulge that turns red or purple. Fever, feeling like you may vomit (nausea), and vomiting. Not being able to poop or to pass gas. How is this treated? Treatment depends on the size of your hernia and whether you have symptoms. If you do not have symptoms, your doctor may have you watch your hernia carefully and have you come in for follow-up visits. If your hernia is large or if you have symptoms, you may need surgery to repair the hernia. Follow these instructions at home: Lifestyle Avoid lifting heavy objects. Avoid standing for long amounts of time. Do not smoke or use any products that contain nicotine or tobacco. If you need help quitting, ask your doctor. Stay at a healthy weight. Prevent trouble pooping You may need to take these actions to prevent or treat trouble pooping: Drink enough fluid to keep your pee (urine) pale yellow. Take over-the-counter or prescription medicines. Eat foods that are high in fiber. These include beans, whole grains, and fresh fruits and vegetables. Limit foods that are high in fat and sugar. These include fried or sweet foods. General instructions You may try to push your hernia back in place by very gently pressing on it when you are lying down. Do not try to push the bulge back in if it will not go in easily. Watch your hernia for any changes in shape, size, or color. Tell your doctor if  you see any changes. Take over-the-counter and prescription medicines only as told by your doctor. Keep all follow-up visits. Contact a doctor if: You have a fever or chills. You have new symptoms. Your symptoms get worse. Get help right away if: You have pain in your groin that gets worse all of a sudden. You have a bulge in your groin that: Gets bigger all of a sudden, and it does not get smaller after that. Turns red or purple. Is painful when you touch it. You are a male, and you  have: Sudden pain in your scrotum. A sudden change in the size of your scrotum. You cannot push the hernia back in place by very gently pressing on it when you are lying down. You feel like you may vomit, and that feeling does not go away. You keep vomiting. You have a fast heartbeat. You cannot poop or pass gas. These symptoms may be an emergency. Get help right away. Call your local emergency services (911 in the [place] Do not wait to see if the symptoms will go away. Do not drive yourself to the hospital. Summary An inguinal hernia is when fat or your intestines push through a weak spot in a muscle where your leg meets your lower belly (groin). This causes a bulge. If you do not have symptoms, you may not need treatment. If you have symptoms or a large hernia, you may need surgery. Avoid lifting heavy objects. Also, avoid standing for long amounts of time. Do not try to push the bulge back in if it will not go in easily. This information is not intended to replace advice given to you by your health care provider. Make sure you discuss any questions you have with your health care provider. Document Revised: 09/07/2019 Document Reviewed: 09/07/2019 Elsevier Patient Education  2024 [employer].

## 2022-08-29 DIAGNOSIS — I77811 Abdominal aortic ectasia: Secondary | ICD-10-CM

## 2022-08-29 HISTORY — DX: Abdominal aortic ectasia: I77.811

## 2022-09-11 ENCOUNTER — Encounter: Payer: Medicare Other | Admitting: Family Medicine

## 2022-09-12 ENCOUNTER — Ambulatory Visit (INDEPENDENT_AMBULATORY_CARE_PROVIDER_SITE_OTHER): Payer: Medicare Other | Admitting: Family Medicine

## 2022-09-12 ENCOUNTER — Encounter: Payer: Self-pay | Admitting: Family Medicine

## 2022-09-12 VITALS — BP 127/79 | HR 83 | Temp 97.7°F | Ht 72.44 in | Wt 173.8 lb

## 2022-09-12 DIAGNOSIS — Z136 Encounter for screening for cardiovascular disorders: Secondary | ICD-10-CM

## 2022-09-12 DIAGNOSIS — E7800 Pure hypercholesterolemia, unspecified: Secondary | ICD-10-CM | POA: Diagnosis not present

## 2022-09-12 DIAGNOSIS — N401 Enlarged prostate with lower urinary tract symptoms: Secondary | ICD-10-CM

## 2022-09-12 DIAGNOSIS — I700 Atherosclerosis of aorta: Secondary | ICD-10-CM | POA: Diagnosis not present

## 2022-09-12 DIAGNOSIS — Z0000 Encounter for general adult medical examination without abnormal findings: Secondary | ICD-10-CM | POA: Diagnosis not present

## 2022-09-12 DIAGNOSIS — I2510 Atherosclerotic heart disease of native coronary artery without angina pectoris: Secondary | ICD-10-CM

## 2022-09-12 DIAGNOSIS — Z7189 Other specified counseling: Secondary | ICD-10-CM

## 2022-09-12 DIAGNOSIS — R9431 Abnormal electrocardiogram [ECG] [EKG]: Secondary | ICD-10-CM

## 2022-09-12 DIAGNOSIS — F17210 Nicotine dependence, cigarettes, uncomplicated: Secondary | ICD-10-CM | POA: Diagnosis not present

## 2022-09-12 DIAGNOSIS — I10 Essential (primary) hypertension: Secondary | ICD-10-CM | POA: Diagnosis not present

## 2022-09-12 DIAGNOSIS — E1169 Type 2 diabetes mellitus with other specified complication: Secondary | ICD-10-CM

## 2022-09-12 DIAGNOSIS — J439 Emphysema, unspecified: Secondary | ICD-10-CM

## 2022-09-12 DIAGNOSIS — I739 Peripheral vascular disease, unspecified: Secondary | ICD-10-CM

## 2022-09-12 LAB — MICROALBUMIN, URINE WAIVED
Creatinine, Urine Waived: 300 mg/dL (ref 10–300)
Microalb, Ur Waived: 150 mg/L — ABNORMAL HIGH (ref 0–19)

## 2022-09-12 LAB — URINALYSIS, ROUTINE W REFLEX MICROSCOPIC
Bilirubin, UA: NEGATIVE
Ketones, UA: NEGATIVE
Leukocytes,UA: NEGATIVE
Nitrite, UA: NEGATIVE
RBC, UA: NEGATIVE
Specific Gravity, UA: 1.025 (ref 1.005–1.030)
Urobilinogen, Ur: 0.2 mg/dL (ref 0.2–1.0)
pH, UA: 6 (ref 5.0–7.5)

## 2022-09-12 LAB — MICROSCOPIC EXAMINATION
Bacteria, UA: NONE SEEN
Epithelial Cells (non renal): NONE SEEN /hpf (ref 0–10)

## 2022-09-12 LAB — BAYER DCA HB A1C WAIVED: HB A1C (BAYER DCA - WAIVED): 7.2 % — ABNORMAL HIGH (ref 4.8–5.6)

## 2022-09-12 NOTE — Progress Notes (Addendum)
BP 127/79   Pulse 83   Temp 97.7 F (36.5 C) (Oral)   Ht 6' 0.44" (1.84 m)   Wt 173 lb 12.8 oz (78.8 kg)   SpO2 99%   BMI 23.29 kg/m    Subjective:    Patient ID: Maxwell Young, male    DOB: [birth year], 67 y.o.   MRN: [id number]  HPI: Maxwell Young is a 67 y.o. male presenting on 09/12/2022 for comprehensive medical examination. Current medical complaints include:  DIABETES Hypoglycemic episodes:no Polydipsia/polyuria: no Visual disturbance: no Chest pain: no Paresthesias: no Glucose Monitoring: yes  Accucheck frequency: Daily Taking Insulin?: no Blood Pressure Monitoring: not checking Retinal Examination: Up to Date Foot Exam: Up to Date Diabetic Education: Completed Pneumovax: Up to Date Influenza:  will get in the fall Aspirin: yes  HYPERTENSION / HYPERLIPIDEMIA Satisfied with current treatment? yes Duration of hypertension: chronic BP monitoring frequency: not checking BP medication side effects: no Past BP meds: amlodipine, metoprolol Duration of hyperlipidemia: chronic Cholesterol medication side effects: no Cholesterol supplements: none Past cholesterol medications: crestor Medication compliance: excellent compliance Aspirin: yes Recent stressors: no Recurrent headaches: no Visual changes: no Palpitations: no Dyspnea: no Chest pain: no Lower extremity edema: no Dizzy/lightheaded: no  BPH BPH status: controlled Satisfied with current treatment?: yes Medication side effects: no Medication compliance: excellent compliance Duration: chronic Nocturia: 1/night Urinary frequency:no Incomplete voiding: no Urgency: no Weak urinary stream: no Straining to start stream: no Dysuria: no Onset: gradual Severity: mild  Interim Problems from his last visit: no  Functional Status Survey: Is the patient deaf or have difficulty hearing?: (P) No Does the patient have difficulty seeing, even when wearing glasses/contacts?: (P) No Does the patient  have difficulty concentrating, remembering, or making decisions?: (P) No Does the patient have difficulty walking or climbing stairs?: (P) No Does the patient have difficulty dressing or bathing?: (P) No Does the patient have difficulty doing errands alone such as visiting a doctor's office or shopping?: (P) No  FALL RISK:    09/12/2022    9:56 AM 09/11/2022   11:23 PM 06/11/2022    3:25 PM 03/05/2022    2:40 PM 12/03/2021    9:43 AM  Fall Risk   Falls in the past year? 0 0 0 0 0  Number falls in past yr: 0  0 0 0  Injury with Fall? 0  0 0 0  Risk for fall due to : No Fall Risks  No Fall Risks No Fall Risks No Fall Risks  Follow up Falls evaluation completed  Falls evaluation completed Falls evaluation completed Falls evaluation completed    Depression Screen    09/12/2022    9:56 AM 06/11/2022    3:25 PM 03/05/2022    2:42 PM 12/03/2021    9:44 AM 08/31/2021   10:11 AM  Depression screen PHQ 2/9  Decreased Interest 0 0 0 0 0  Down, Depressed, Hopeless 0 0 0 0 0  PHQ - 2 Score 0 0 0 0 0  Altered sleeping 0 1 0 1 0  Tired, decreased energy 0 1  1 0  Change in appetite 0 0 1 1 0  Feeling bad or failure about yourself  0 0 0 0 0  Trouble concentrating 0 0 0 0 0  Moving slowly or fidgety/restless 0 0 0 0 0  Suicidal thoughts 0 0 0 0 0  PHQ-9 Score 0 2 1 3  0  Difficult doing work/chores Not difficult at all Not difficult  at all Not difficult at all Not difficult at all Not difficult at all    Advanced Directives Does patient have a HCPOA?    no If yes, name and contact information:  Does patient have a living will or MOST form?  no  Past Medical History:  Past Medical History:  Diagnosis Date   CAD (coronary artery disease)    Colon polyps 01/21/2009   Diabetes mellitus without complication (HCC)    Diverticula of colon 01/21/2009   Elevated fasting glucose    GERD (gastroesophageal reflux disease)    History of hemorrhoids 01/21/2009   internal   Hyperlipidemia     Hypertension    Personal history of tobacco use, presenting hazards to health 07/27/2015   Wears dentures    full upper and lower    Surgical History:  Past Surgical History:  Procedure Laterality Date   CATARACT EXTRACTION Right 03/04/2022   CERVICAL FUSION  06/09/003   COLONOSCOPY     COLONOSCOPY N/A 09/14/2020   Procedure: COLONOSCOPY;  Surgeon: Midge Minium, MD;  Location: [facility];  Service: Endoscopy;  Laterality: N/A;   COLONOSCOPY WITH PROPOFOL N/A 07/24/2015   Procedure: COLONOSCOPY WITH PROPOFOL;  Surgeon: Midge Minium, MD;  Location: [facility];  Service: Endoscopy;  Laterality: N/A;   CORONARY ARTERY BYPASS GRAFT  01/21/2006   [facility] - 3 vessel   POLYPECTOMY  07/24/2015   Procedure: POLYPECTOMY;  Surgeon: Midge Minium, MD;  Location: [facility];  Service: Endoscopy;;    Medications:  Current Outpatient Medications on File Prior to Visit  Medication Sig   amLODipine (NORVASC) 10 MG tablet Take 1 tablet (10 mg total) by mouth daily.   aspirin EC 325 MG tablet Take 325 mg by mouth daily.   Blood Glucose Monitoring Suppl (CONTOUR NEXT ONE) KIT 1 each by Does not apply route in the morning and at bedtime.   colesevelam (WELCHOL) 625 MG tablet Take 3 tablets (1,875 mg total) by mouth 2 (two) times daily.   glucose blood test strip USE DAILY TO CHECK BLOOD SUGAR.   Lancets (ONETOUCH ULTRASOFT) lancets USE DAILY TO CHECK BLOOD SUGAR.   metFORMIN (GLUCOPHAGE) 500 MG tablet Take 2 tablets (1,000 mg total) by mouth daily with breakfast.   metoprolol succinate (TOPROL-XL) 100 MG 24 hr tablet Take 1 tablet (100 mg total) by mouth daily. Take with or immediately following a meal.   Multiple Vitamins-Minerals (MULTIVITAMIN ADULT PO) Take by mouth daily.   omega-3 acid ethyl esters (LOVAZA) 1 g capsule TAKE 2 CAPSULES BY MOUTH TWICE  DAILY   omeprazole (PRILOSEC) 20 MG capsule Take 20 mg by mouth daily.   rosuvastatin (CRESTOR) 40 MG tablet Take 1 tablet (40 mg  total) by mouth daily.   tamsulosin (FLOMAX) 0.4 MG CAPS capsule Take 2 capsules (0.8 mg total) by mouth daily.   No current facility-administered medications on file prior to visit.    Allergies:  No Known Allergies  Social History:  Social History   Socioeconomic History   Marital status: Married    Spouse name: Not on file   Number of children: Not on file   Years of education: Not on file   Highest education level: Not on file  Occupational History   Not on file  Tobacco Use   Smoking status: Every Day    Current packs/day: 1.00    Average packs/day: 1 pack/day for 40.0 years (40.0 ttl pk-yrs)    Types: Cigarettes   Smokeless tobacco: Never  Vaping Use   Vaping status: Never Used  Substance and Sexual Activity   Alcohol use: No   Drug use: No   Sexual activity: Yes  Other Topics Concern   Not on file  Social History Narrative   Not on file   Social Determinants of Health   Financial Resource Strain: Not on file  Food Insecurity: Not on file  Transportation Needs: Not on file  Physical Activity: Not on file  Stress: Not on file  Social Connections: Not on file  Intimate Partner Violence: Not on file   Social History   Tobacco Use  Smoking Status Every Day   Current packs/day: 1.00   Average packs/day: 1 pack/day for 40.0 years (40.0 ttl pk-yrs)   Types: Cigarettes  Smokeless Tobacco Never   Social History   Substance and Sexual Activity  Alcohol Use No    Family History:  Family History  Problem Relation Age of Onset   Hypertension Mother    Heart disease Mother        CAD   Cancer Father        colon, prostate   Stroke Father    Parkinson's disease Father     Past medical history, surgical history, medications, allergies, family history and social history reviewed with patient today and changes made to appropriate areas of the chart.   Review of Systems  Constitutional: Negative.   HENT: Negative.    Eyes: Negative.   Respiratory:   Positive for cough. Negative for hemoptysis, sputum production, shortness of breath and wheezing.   Cardiovascular: Negative.   Gastrointestinal: Negative.   Genitourinary: Negative.   Musculoskeletal: Negative.   Skin: Negative.   Neurological: Negative.   Endo/Heme/Allergies:  Negative for environmental allergies and polydipsia. Bruises/bleeds easily.  Psychiatric/Behavioral: Negative.     All other ROS negative except what is listed above and in the HPI.      Objective:    BP 127/79   Pulse 83   Temp 97.7 F (36.5 C) (Oral)   Ht 6' 0.44" (1.84 m)   Wt 173 lb 12.8 oz (78.8 kg)   SpO2 99%   BMI 23.29 kg/m   Wt Readings from Last 3 Encounters:  09/12/22 173 lb 12.8 oz (78.8 kg)  06/24/22 166 lb 3.2 oz (75.4 kg)  06/11/22 167 lb 9.6 oz (76 kg)    Hearing Screening   500Hz  1000Hz  2000Hz  4000Hz   Right ear 20 20 20 20   Left ear 20 20 20 20    Vision Screening   Right eye Left eye Both eyes  Without correction 20/20 20/30   With correction       Physical Exam Vitals and nursing note reviewed.  Constitutional:      General: He is not in acute distress.    Appearance: Normal appearance. He is not ill-appearing, toxic-appearing or diaphoretic.  HENT:     Head: Normocephalic and atraumatic.     Right Ear: Tympanic membrane, ear canal and external ear normal. There is no impacted cerumen.     Left Ear: Tympanic membrane, ear canal and external ear normal. There is no impacted cerumen.     Nose: Nose normal. No congestion or rhinorrhea.     Mouth/Throat:     Mouth: Mucous membranes are moist.     Pharynx: Oropharynx is clear. No oropharyngeal exudate or posterior oropharyngeal erythema.  Eyes:     General: No scleral icterus.       Right eye: No discharge.  Left eye: No discharge.     Extraocular Movements: Extraocular movements intact.     Conjunctiva/sclera: Conjunctivae normal.     Pupils: Pupils are equal, round, and reactive to light.  Neck:     Vascular:  No carotid bruit.  Cardiovascular:     Rate and Rhythm: Normal rate and regular rhythm.     Pulses: Normal pulses.     Heart sounds: No murmur heard.    No friction rub. No gallop.  Pulmonary:     Effort: Pulmonary effort is normal. No respiratory distress.     Breath sounds: Normal breath sounds. No stridor. No wheezing, rhonchi or rales.  Chest:     Chest wall: No tenderness.  Abdominal:     General: Abdomen is flat. Bowel sounds are normal. There is no distension.     Palpations: Abdomen is soft. There is no mass.     Tenderness: There is no abdominal tenderness. There is no right CVA tenderness, left CVA tenderness, guarding or rebound.     Hernia: No hernia is present.  Genitourinary:    Comments: Genital exam deferred with shared decision making Musculoskeletal:        General: No swelling, tenderness, deformity or signs of injury.     Cervical back: Normal range of motion and neck supple. No rigidity. No muscular tenderness.     Right lower leg: No edema.     Left lower leg: No edema.  Lymphadenopathy:     Cervical: No cervical adenopathy.  Skin:    General: Skin is warm and dry.     Capillary Refill: Capillary refill takes less than 2 seconds.     Coloration: Skin is not jaundiced or pale.     Findings: No bruising, erythema, lesion or rash.  Neurological:     General: No focal deficit present.     Mental Status: He is alert and oriented to person, place, and time.     Cranial Nerves: No cranial nerve deficit.     Sensory: No sensory deficit.     Motor: No weakness.     Coordination: Coordination normal.     Gait: Gait normal.     Deep Tendon Reflexes: Reflexes normal.  Psychiatric:        Mood and Affect: Mood normal.        Behavior: Behavior normal.        Thought Content: Thought content normal.        Judgment: Judgment normal.        09/12/2022    9:50 AM  6CIT Screen  What Year? 0 points  What month? 0 points  What time? 0 points  Count back from 20  0 points  Months in reverse 0 points  Repeat phrase 0 points  Total Score 0 points    Results for orders placed or performed in visit on 09/12/22  Microscopic Examination   Urine  Result Value Ref Range   WBC, UA 0-5 0 - 5 /hpf   RBC, Urine 0-2 0 - 2 /hpf   Epithelial Cells (non renal) None seen 0 - 10 /hpf   Casts Present (A) None seen /lpf   Cast Type Hyaline casts N/A   Mucus, UA Present (A) Not Estab.   Bacteria, UA None seen None seen/Few  Urinalysis, Routine w reflex microscopic  Result Value Ref Range   Specific Gravity, UA 1.025 1.005 - 1.030   pH, UA 6.0 5.0 - 7.5   Color, UA Yellow Yellow  Appearance Ur Clear Clear   Leukocytes,UA Negative Negative   Protein,UA 2+ (A) Negative/Trace   Glucose, UA Trace (A) Negative   Ketones, UA Negative Negative   RBC, UA Negative Negative   Bilirubin, UA Negative Negative   Urobilinogen, Ur 0.2 0.2 - 1.0 mg/dL   Nitrite, UA Negative Negative   Microscopic Examination See below:   Microalbumin, Urine Waived  Result Value Ref Range   Microalb, Ur Waived 150 (H) 0 - 19 mg/L   Creatinine, Urine Waived 300 10 - 300 mg/dL   Microalb/Creat Ratio 30-300 (H) <30 mg/g  Bayer DCA Hb A1c Waived  Result Value Ref Range   HB A1C (BAYER DCA - WAIVED) 7.2 (H) 4.8 - 5.6 %      Assessment & Plan:   Problem List Items Addressed This Visit       Cardiovascular and Mediastinum   Essential hypertension    Under good control on current regimen. Continue current regimen. Continue to monitor. Call with any concerns. Refills given. Labs drawn today.        Relevant Orders   Comprehensive metabolic panel   CBC with Differential/Platelet   TSH   Microalbumin, Urine Waived (Completed)   Microscopic Examination (Completed)   Ambulatory referral to Cardiology   CAD (coronary artery disease)    Referral to cardiology placed today.       Relevant Orders   Ambulatory referral to Cardiology   Aortic atherosclerosis (HCC)    Will keep his  BP and cholesterol and sugar under good control. Continue to monitor. Cal with any concerns.       Relevant Orders   Comprehensive metabolic panel   CBC with Differential/Platelet   Lipid Panel w/o Chol/HDL Ratio   Ambulatory referral to Cardiology   PAD (peripheral artery disease) (HCC)    Will keep BP and cholesterol and sugars under good control. Continue to monitor. Call with any concerns.         Respiratory   COPD (chronic obstructive pulmonary disease) with emphysema (HCC)    Stable. Continue to monitor. Call with any concerns.         Endocrine   Type 2 diabetes mellitus with other specified complication (HCC)    Up with A1c of 7.2 up from 5.6. Will really work on diet and exercise and recheck in 3 months. Call with any concerns.       Relevant Orders   Comprehensive metabolic panel   CBC with Differential/Platelet   Microalbumin, Urine Waived (Completed)   Bayer DCA Hb A1c Waived (Completed)     Genitourinary   Benign prostatic hyperplasia with lower urinary tract symptoms    Under good control on current regimen. Continue current regimen. Continue to monitor. Call with any concerns. Refills given. Labs drawn today.        Relevant Orders   PSA     Other   Hyperlipidemia    Under good control on current regimen. Continue current regimen. Continue to monitor. Call with any concerns. Refills given. Labs drawn today.        Relevant Orders   Comprehensive metabolic panel   CBC with Differential/Platelet   Lipid Panel w/o Chol/HDL Ratio   AMB Referral to Pharmacy Medication Management   Ambulatory referral to Cardiology   Smoking greater than 40 pack years    Will get AAA screening done. Up to date on CT lung. Not interested in quitting at this time.       Relevant Orders  Comprehensive metabolic panel   CBC with Differential/Platelet   Urinalysis, Routine w reflex microscopic (Completed)   [place] AORTA MEDICARE SCREENING   Advance care planning    A  voluntary discussion about advance care planning including the explanation and discussion of advance directives was extensively discussed  with the patient for 3 minutes with patient present.  Explanation about the health care proxy and Living will was reviewed and packet with forms with explanation of how to fill them out was given.  During this discussion, the patient was not able to identify a health care proxy and plans to fill out the paperwork required.  Patient was offered a separate Advance Care Planning visit for further assistance with forms.         Other Visit Diagnoses     Welcome to Medicare preventive visit    -  Primary   Preventative care discussed today as below.   Relevant Orders   [place]A MEDICARE SCREENING   Screening for AAA (abdominal aortic aneurysm)       AAA [place] ordered   Relevant Orders   [place]A MEDICARE SCREENING   Screening for cardiovascular condition       No prior EKG. Will get him into cardiology for further evaluation.   Relevant Orders   EKG 12-Lead (Completed)   Abnormal EKG       No prior EKG. Will get him into cardiology for further evaluation. Asymptomatic.   Relevant Orders   Ambulatory referral to Cardiology        Preventative Services:  AAA Screening: Ordered today Health Risk Assessment and Personalized Prevention Plan: Done today Bone Mass Measurements: N/A CVD Screening: Done today Colon Cancer Screening: up to date Depression Screening: done today Diabetes Screening: done today Glaucoma Screening: see your eye doctor Hepatitis B vaccine: N/A Hepatitis C screening: Up to date HIV Screening: up to date Flu Vaccine: will get next visit Lung cancer Screening: up to date Obesity Screening:  done today Pneumonia Vaccines (2): up to date STI Screening: N/A PSA screening: done today   LABORATORY TESTING:  Health maintenance labs ordered today as discussed above.   The natural history of prostate cancer and ongoing controversy  regarding screening and potential treatment outcomes of prostate cancer has been discussed with the patient. The meaning of a false positive PSA and a false negative PSA has been discussed. He indicates understanding of the limitations of this screening test and wishes to proceed with screening PSA testing.   IMMUNIZATIONS:   - Tdap: Tetanus vaccination status reviewed: last tetanus booster within 10 years. - Influenza: Postponed to flu season - Pneumovax: Up to date - Prevnar: Up to date - Zostavax vaccine: Up to date  SCREENING: - Colonoscopy: Up to date  Discussed with patient purpose of the colonoscopy is to detect colon cancer at curable precancerous or early stages   - AAA Screening: Ordered today  -Hearing Test: Up to date   PATIENT COUNSELING:    Sexuality: Discussed sexually transmitted diseases, partner selection, use of condoms, avoidance of unintended pregnancy  and contraceptive alternatives.   Advised to avoid cigarette smoking.  I discussed with the patient that most people either abstain from alcohol or drink within safe limits (<=14/week and <=4 drinks/occasion for males, <=7/weeks and <= 3 drinks/occasion for females) and that the risk for alcohol disorders and other health effects rises proportionally with the number of drinks per week and how often a drinker exceeds daily limits.  Discussed cessation/primary prevention  of drug use and availability of treatment for abuse.   Diet: Encouraged to adjust caloric intake to maintain  or achieve ideal body weight, to reduce intake of dietary saturated fat and total fat, to limit sodium intake by avoiding high sodium foods and not adding table salt, and to maintain adequate dietary potassium and calcium preferably from fresh fruits, vegetables, and low-fat dairy products.    stressed the importance of regular exercise  Injury prevention: Discussed safety belts, safety helmets, smoke detector, smoking near bedding or  upholstery.   Dental health: Discussed importance of regular tooth brushing, flossing, and dental visits.   Follow up plan: NEXT PREVENTATIVE PHYSICAL DUE IN 1 YEAR. Return in about 3 months (around 12/13/2022).

## 2022-09-12 NOTE — Assessment & Plan Note (Signed)
Under good control on current regimen. Continue current regimen. Continue to monitor. Call with any concerns. Refills given. Labs drawn today.   

## 2022-09-12 NOTE — Assessment & Plan Note (Signed)
Will get AAA screening done. Up to date on CT lung. Not interested in quitting at this time.

## 2022-09-12 NOTE — Assessment & Plan Note (Signed)
Will keep his BP and cholesterol and sugar under good control. Continue to monitor. Cal with any concerns.

## 2022-09-12 NOTE — Assessment & Plan Note (Signed)
A voluntary discussion about advance care planning including the explanation and discussion of advance directives was extensively discussed  with the patient for 3 minutes with patient present.  Explanation about the health care proxy and Living will was reviewed and packet with forms with explanation of how to fill them out was given.  During this discussion, the patient was not able to identify a health care proxy and plans to fill out the paperwork required.  Patient was offered a separate Advance Care Planning visit for further assistance with forms.    

## 2022-09-12 NOTE — Assessment & Plan Note (Signed)
Referral to cardiology placed today

## 2022-09-12 NOTE — Patient Instructions (Signed)
Preventative Services:  AAA Screening: Ordered today Health Risk Assessment and Personalized Prevention Plan: Done today Bone Mass Measurements: N/A CVD Screening: Done today Colon Cancer Screening: up to date Depression Screening: done today Diabetes Screening: done today Glaucoma Screening: see your eye doctor Hepatitis B vaccine: N/A Hepatitis C screening: Up to date HIV Screening: up to date Flu Vaccine: will get next visit Lung cancer Screening: up to date Obesity Screening:  done today Pneumonia Vaccines (2): up to date STI Screening: N/A PSA screening: done today

## 2022-09-12 NOTE — Addendum Note (Signed)
Addended by: Dorcas Carrow on: 09/12/2022 01:17 PM   Modules accepted: Level of Service

## 2022-09-12 NOTE — Assessment & Plan Note (Signed)
Will keep BP and cholesterol and sugars under good control. Continue to monitor. Call with any concerns.  

## 2022-09-12 NOTE — Assessment & Plan Note (Signed)
Stable. Continue to monitor. Call with any concerns.  ?

## 2022-09-12 NOTE — Assessment & Plan Note (Signed)
Up with A1c of 7.2 up from 5.6. Will really work on diet and exercise and recheck in 3 months. Call with any concerns.

## 2022-09-13 LAB — CBC WITH DIFFERENTIAL/PLATELET
Basophils Absolute: 0 10*3/uL (ref 0.0–0.2)
Basos: 0 %
EOS (ABSOLUTE): 0 10*3/uL (ref 0.0–0.4)
Eos: 0 %
Hematocrit: 48.7 % (ref 37.5–51.0)
Hemoglobin: 16.3 g/dL (ref 13.0–17.7)
Immature Grans (Abs): 0 10*3/uL (ref 0.0–0.1)
Immature Granulocytes: 0 %
Lymphocytes Absolute: 1.1 10*3/uL (ref 0.7–3.1)
Lymphs: 15 %
MCH: 31.7 pg (ref 26.6–33.0)
MCHC: 33.5 g/dL (ref 31.5–35.7)
MCV: 95 fL (ref 79–97)
Monocytes Absolute: 0.7 10*3/uL (ref 0.1–0.9)
Monocytes: 9 %
Neutrophils Absolute: 5.6 10*3/uL (ref 1.4–7.0)
Neutrophils: 76 %
Platelets: 147 10*3/uL — ABNORMAL LOW (ref 150–450)
RBC: 5.14 x10E6/uL (ref 4.14–5.80)
RDW: 13 % (ref 11.6–15.4)
WBC: 7.4 10*3/uL (ref 3.4–10.8)

## 2022-09-13 LAB — LIPID PANEL W/O CHOL/HDL RATIO
Cholesterol, Total: 116 mg/dL (ref 100–199)
HDL: 53 mg/dL (ref >39)
LDL Chol Calc (NIH): 33 mg/dL (ref 0–99)
Triglycerides: 187 mg/dL — ABNORMAL HIGH (ref 0–149)
VLDL Cholesterol Cal: 30 mg/dL (ref 5–40)

## 2022-09-13 LAB — COMPREHENSIVE METABOLIC PANEL
ALT: 78 IU/L — ABNORMAL HIGH (ref 0–44)
AST: 45 IU/L — ABNORMAL HIGH (ref 0–40)
Albumin: 4.5 g/dL (ref 3.9–4.9)
Alkaline Phosphatase: 101 IU/L (ref 44–121)
BUN/Creatinine Ratio: 9 — ABNORMAL LOW (ref 10–24)
BUN: 9 mg/dL (ref 8–27)
Bilirubin Total: 0.5 mg/dL (ref 0.0–1.2)
CO2: 25 mmol/L (ref 20–29)
Calcium: 9.3 mg/dL (ref 8.6–10.2)
Chloride: 100 mmol/L (ref 96–106)
Creatinine, Ser: 1.04 mg/dL (ref 0.76–1.27)
Globulin, Total: 2.5 g/dL (ref 1.5–4.5)
Glucose: 250 mg/dL — ABNORMAL HIGH (ref 70–99)
Potassium: 4 mmol/L (ref 3.5–5.2)
Sodium: 139 mmol/L (ref 134–144)
Total Protein: 7 g/dL (ref 6.0–8.5)
eGFR: 80 mL/min/{1.73_m2} (ref >59)

## 2022-09-13 LAB — PSA: Prostate Specific Ag, Serum: 3.4 ng/mL (ref 0.0–4.0)

## 2022-09-13 LAB — TSH: TSH: 2.03 u[IU]/mL (ref 0.450–4.500)

## 2022-09-15 NOTE — Progress Notes (Signed)
Interpreted by me 09/12/22. Sinus rhythm with RBBB, 70bpm

## 2022-09-17 ENCOUNTER — Telehealth: Payer: Self-pay

## 2022-09-17 NOTE — Progress Notes (Signed)
   Care Guide Note  09/17/2022 Name: Maxwell Young MRN: [id number] DOB: [birth year]  Referred by: Dorcas Carrow, DO Reason for referral : Care Coordination (Outreach to schedule with Pharm d )   Maxwell Young is a 67 y.o. year old male who is a primary care patient of Dorcas Carrow, DO. Maxwell Young was referred to the pharmacist for assistance related to DM.    Successful contact was made with the patient to discuss pharmacy services including being ready for the pharmacist to call at least 5 minutes before the scheduled appointment time, to have medication bottles and any blood sugar or blood pressure readings ready for review. The patient agreed to meet with the pharmacist via with the pharmacist via telephone visit on (date/time).  10/01/2022  Penne Lash, RMA Care Guide [facility]  [place], [place] [address] Direct Dial: [phone] [email or web address]

## 2022-09-18 ENCOUNTER — Ambulatory Visit
Admission: RE | Admit: 2022-09-18 | Discharge: 2022-09-18 | Disposition: A | Discharge status: Home / Self Care | Payer: Medicare Other | Source: Ambulatory Visit | Attending: Family Medicine | Admitting: Family Medicine

## 2022-09-18 DIAGNOSIS — Z136 Encounter for screening for cardiovascular disorders: Secondary | ICD-10-CM | POA: Diagnosis present

## 2022-09-18 DIAGNOSIS — Z0000 Encounter for general adult medical examination without abnormal findings: Secondary | ICD-10-CM | POA: Diagnosis present

## 2022-09-18 DIAGNOSIS — F17210 Nicotine dependence, cigarettes, uncomplicated: Secondary | ICD-10-CM | POA: Diagnosis present

## 2022-09-24 ENCOUNTER — Encounter: Payer: Self-pay | Admitting: Nurse Practitioner

## 2022-09-24 DIAGNOSIS — I77811 Abdominal aortic ectasia: Secondary | ICD-10-CM | POA: Insufficient documentation

## 2022-09-24 NOTE — Progress Notes (Signed)
Contacted via MyChart    Good afternoon Real, this is Kenni Newton, one of the nurse practitioners in the office.  Your imaging has returned.  Your abdominal aortic screening did notice mild distension of the abdominal aorta (ectactic).  For this the recommendation is repeat imaging in 5 years.  Also focus heavily on healthy diet to maintain a stable blood pressure and cholesterol levels.  Continue your statin therapy and all current medications.  Any questions?  I will leave this for Dr. Laural Benes to see upon return to office.  Have a great evening.

## 2022-10-01 ENCOUNTER — Other Ambulatory Visit: Payer: Medicare Other

## 2022-10-01 DIAGNOSIS — E7800 Pure hypercholesterolemia, unspecified: Secondary | ICD-10-CM

## 2022-10-01 MED ORDER — COLESEVELAM HCL 625 MG PO TABS
1875.0000 mg | ORAL_TABLET | Freq: Two times a day (BID) | ORAL | 3 refills | Status: DC
Start: 2022-10-01 — End: 2022-11-27

## 2022-10-01 NOTE — Progress Notes (Signed)
10/01/2022 Name: Maxwell Young MRN: [id number] DOB: [birth year]  Chief Complaint  Patient presents with   Medication Management   Maxwell Young is a 67 y.o. year old male who presented for a telephone visit.   They were referred to the pharmacist by their PCP for assistance in managing medication access.   Subjective:  Care Team: Primary Care Provider: Dorcas Carrow, DO ; Next Scheduled Visit: 11/22  Medication Access/Adherence  Current Pharmacy:  [facility] - [place], [place] - [address] [place] [address] Phone: [phone] Fax: [phone]  [id number] - [place], [place] - [address] [place] [address] Phone: [phone] Fax: [phone]  -Patient reports affordability concerns with their medications: Yes - Welchol -Patient reports access/transportation concerns to their pharmacy: No  -Patient reports adherence concerns with their medications:  Yes  Has been out of Welchol approximately 3 weeks  Hyperlipidemia/ASCVD Risk Reduction Current lipid lowering medications: rosuvastatin 40mg  daily, Welchol 1875mg  BID, Omega-3 2g BID Antiplatelet regimen: ASA 325mg  daily -Patient was previously receiving Welchol for $24/9months, but he now has Medicare D; and this was going to cost $600  Objective: Lab Results  Component Value Date   CHOL 116 09/12/2022   HDL 53 09/12/2022   LDLCALC 33 09/12/2022   TRIG 187 (H) 09/12/2022   CHOLHDL 5.6 (H) 03/02/2018   Medications Reviewed Today     Reviewed by Lenna Gilford, RPH (Pharmacist) on 10/01/22 at 1339  Med List Status: <None>   Medication Order Taking? Sig Documenting Provider Last Dose Status Informant  amLODipine (NORVASC) 10 MG tablet [id number] Yes Take 1 tablet (10 mg total) by mouth daily. Olevia Perches P, DO Taking Active   aspirin EC 325 MG tablet [id number] Yes Take 325 mg by mouth daily. [provider] Taking Active   Blood  Glucose Monitoring Suppl (CONTOUR NEXT ONE) KIT [id number] Yes 1 each by Does not apply route in the morning and at bedtime. Olevia Perches P, DO Taking Active   colesevelam [facility]) 625 MG tablet [id number] No Take 3 tablets (1,875 mg total) by mouth 2 (two) times daily.  Patient not taking: Reported on 10/01/2022   Dorcas Carrow, DO Not Taking Active   glucose blood test strip [id number] Yes USE DAILY TO CHECK BLOOD SUGAR. Olevia Perches P, DO Taking Active   Lancets [facility] ULTRASOFT) lancets [id number] Yes USE DAILY TO CHECK BLOOD SUGAR. Olevia Perches P, DO Taking Active   metFORMIN (GLUCOPHAGE) 500 MG tablet [id number] Yes Take 2 tablets (1,000 mg total) by mouth daily with breakfast. Olevia Perches P, DO Taking Active   metoprolol succinate (TOPROL-XL) 100 MG 24 hr tablet [id number] Yes Take 1 tablet (100 mg total) by mouth daily. Take with or immediately following a meal. Laural Benes, Megan P, DO Taking Active   Multiple Vitamins-Minerals (MULTIVITAMIN ADULT PO) [id number] Yes Take by mouth daily. [provider] Taking Active   omega-3 acid ethyl esters (LOVAZA) 1 g capsule [id number] Yes TAKE 2 CAPSULES BY MOUTH TWICE  DAILY Johnson, Megan P, DO Taking Active   omeprazole (PRILOSEC) 20 MG capsule [id number] Yes Take 20 mg by mouth every other day. [provider] Taking Active   rosuvastatin (CRESTOR) 40 MG tablet [id number] Yes Take 1 tablet (40 mg total) by mouth daily. Olevia Perches P, DO Taking Active   tamsulosin (FLOMAX) 0.4 MG CAPS capsule [id number] Yes Take 2 capsules (0.8 mg total) by mouth  daily. Olevia Perches P, DO Taking Active            Assessment/Plan:   Hyperlipidemia/ASCVD Risk Reduction: - Currently controlled.  - Contacted patient's insurance, and they state he has not yet met his plan deductible; and Welchol (gerneric) is a tier 4 drug on his plan. - Looked at [facility] website, and the medication would be $105/90 days - [facility] - Prescription pending for Dr. Laural Benes to sign [facility].  I have included [facility] card information on the prescription for them to bill to instead of insurance  Follow Up Plan: Once order is signed, I will contact [facility] to verify cost and notify patient  Lenna Gilford, PharmD, DPLA

## 2022-10-02 ENCOUNTER — Telehealth: Payer: Self-pay

## 2022-10-02 NOTE — Progress Notes (Signed)
   10/02/2022  Patient ID: Josem Kaufmann, male   DOB: October 12, 1955, 67 y.o.   MRN: [id number]  Colesevelam [facility] with [facility] coupon information yesterday.  [facility], and a 1 month supply is going through for $43.71.  Prescription was sent for 3 month supply, so if patient would like the medication filled this way he can request that [facility] do so.  Tried to contact patient to discuss and inform him that Omega-3 would likely be cheaper under [facility], too, if he would like [facility].  I was not able to reach the patient, and he does not have a voicemail set up.  Sending a MyChart message with the above information.  Lenna Gilford, PharmD, DPLA

## 2022-11-18 ENCOUNTER — Encounter: Payer: Self-pay | Admitting: Family Medicine

## 2022-11-27 ENCOUNTER — Encounter: Payer: Self-pay | Admitting: Internal Medicine

## 2022-11-27 ENCOUNTER — Ambulatory Visit: Payer: Medicare Other | Attending: Internal Medicine | Admitting: Internal Medicine

## 2022-11-27 ENCOUNTER — Other Ambulatory Visit: Payer: Self-pay | Admitting: Family Medicine

## 2022-11-27 VITALS — BP 122/74 | HR 72 | Ht 72.0 in | Wt 176.0 lb

## 2022-11-27 DIAGNOSIS — I739 Peripheral vascular disease, unspecified: Secondary | ICD-10-CM

## 2022-11-27 DIAGNOSIS — I700 Atherosclerosis of aorta: Secondary | ICD-10-CM

## 2022-11-27 DIAGNOSIS — I2510 Atherosclerotic heart disease of native coronary artery without angina pectoris: Secondary | ICD-10-CM | POA: Diagnosis present

## 2022-11-27 DIAGNOSIS — E785 Hyperlipidemia, unspecified: Secondary | ICD-10-CM | POA: Insufficient documentation

## 2022-11-27 DIAGNOSIS — I10 Essential (primary) hypertension: Secondary | ICD-10-CM | POA: Insufficient documentation

## 2022-11-27 DIAGNOSIS — R9431 Abnormal electrocardiogram [ECG] [EKG]: Secondary | ICD-10-CM | POA: Diagnosis present

## 2022-11-27 DIAGNOSIS — E1169 Type 2 diabetes mellitus with other specified complication: Secondary | ICD-10-CM | POA: Insufficient documentation

## 2022-11-27 DIAGNOSIS — I77811 Abdominal aortic ectasia: Secondary | ICD-10-CM

## 2022-11-27 NOTE — Telephone Encounter (Signed)
Requested Prescriptions  Pending Prescriptions Disp Refills   amLODipine (NORVASC) 10 MG tablet [Pharmacy Med Name: AMLODIPINE BESYLATE TABS 10MG ] 90 tablet 1    Sig: TAKE 1 TABLET DAILY     Cardiovascular: Calcium Channel Blockers 2 Passed - 11/27/2022 12:36 AM      Passed - Last BP in normal range    BP Readings from Last 1 Encounters:  11/27/22 122/74         Passed - Last Heart Rate in normal range    Pulse Readings from Last 1 Encounters:  11/27/22 72         Passed - Valid encounter within last 6 months    Recent Outpatient Visits           2 months ago [facility] to [employer] preventive visit   [place] [facility] [place], Megan P, DO   5 months ago Aortic atherosclerosis (HCC)   [facility] [place], Megan P, DO   8 months ago Type 2 diabetes mellitus with other specified complication, without long-term current use of insulin (HCC)   [facility] [place], Megan P, DO   11 months ago Routine general medical examination at a health care facility   [facility] [place], [place] P, DO   1 year ago Type 2 diabetes mellitus with diabetic peripheral angiopathy without gangrene, without long-term current use of insulin (HCC)   [place] [facility] [place], [place], DO       Future Appointments             In 2 weeks Laural Benes, Oralia Rud, DO [place] [facility]             metoprolol succinate (TOPROL-XL) 100 MG 24 hr tablet [Pharmacy Med Name: METOPROLOL SUCCINATE ER TABS 100MG ] 90 tablet 1    Sig: TAKE 1 TABLET DAILY, TAKE WITH OR IMMEDIATELY FOLLOWING A MEAL     Cardiovascular:  Beta Blockers Passed - 11/27/2022 12:36 AM      Passed - Last BP in normal range    BP Readings from Last 1 Encounters:  11/27/22 122/74         Passed - Last Heart Rate in normal range    Pulse Readings from Last 1 Encounters:  11/27/22 72         Passed - Valid  encounter within last 6 months    Recent Outpatient Visits           2 months ago Welcome to [employer] preventive visit   [place] [facility] [place], Megan P, DO   5 months ago Aortic atherosclerosis (HCC)   [place] [facility] [place], Megan P, DO   8 months ago Type 2 diabetes mellitus with other specified complication, without long-term current use of insulin (HCC)   [place] [facility] [employer], Megan P, DO   11 months ago Routine general medical examination at a health care facility   [facility] [place], [place] P, DO   1 year ago Type 2 diabetes mellitus with diabetic peripheral angiopathy without gangrene, without long-term current use of insulin [facility])   [place] [facility] [place], Oralia Rud, DO       Future Appointments             In 2 weeks Laural Benes, Oralia Rud, DO  [facility]  omega-3 acid ethyl esters (LOVAZA) 1 g capsule [Pharmacy Med Name: OMEGA 3 ACID ETHYL ESTERS CAPS 1GM] 360 capsule 1    Sig: TAKE 2 CAPSULES TWICE A DAY     Endocrinology:  Nutritional Agents - omega-3 acid ethyl esters Failed - 11/27/2022 12:36 AM      Failed - Lipid Panel in normal range within the last 12 months    Cholesterol, Total  Date Value Ref Range Status  09/12/2022 116 100 - 199 mg/dL Final   Cholesterol Piccolo, Waived  Date Value Ref Range Status  08/26/2017 165 <200 mg/dL Final    Comment:                            Desirable                <200                         Borderline High      200- 239                         High                     >239    LDL Chol Calc (NIH)  Date Value Ref Range Status  09/12/2022 33 0 - 99 mg/dL Final   HDL  Date Value Ref Range Status  09/12/2022 53 >39 mg/dL Final   Triglycerides  Date Value Ref Range Status  09/12/2022 187 (H) 0 - 149 mg/dL Final   Triglycerides Piccolo,Waived  Date Value Ref Range  Status  08/26/2017 412 (H) <150 mg/dL Final    Comment:                            Normal                   <150                         Borderline High     150 - 199                         High                200 - 499                         Very High                >499          Passed - Valid encounter within last 12 months    Recent Outpatient Visits           2 months ago Welcome to [employer] preventive visit   [place] [facility] [place], Megan P, DO   5 months ago Aortic atherosclerosis (HCC)   [place] [facility] [place], Megan P, DO   8 months ago Type 2 diabetes mellitus with other specified complication, without long-term current use of insulin (HCC)   [place] [facility] [place], Megan P, DO   11 months ago Routine general medical examination at a health care facility   [facility], Megan P, DO   1  year ago Type 2 diabetes mellitus with diabetic peripheral angiopathy without gangrene, without long-term current use of insulin (HCC)   [place] [facility] [place], [place], DO       Future Appointments             In 2 weeks Laural Benes, Oralia Rud, DO [place] [facility]             tamsulosin (FLOMAX) 0.4 MG CAPS capsule [Pharmacy Med Name: TAMSULOSIN HCL CAPS 0.4MG ] 180 capsule 1    Sig: TAKE 2 CAPSULES DAILY     Urology: Alpha-Adrenergic Blocker Passed - 11/27/2022 12:36 AM      Passed - PSA in normal range and within 360 days    Prostate Specific Ag, Serum  Date Value Ref Range Status  09/12/2022 3.4 0.0 - 4.0 ng/mL Final    Comment:    Roche ECLIA methodology. According to the American Urological Association, Serum PSA should decrease and remain at undetectable levels after radical prostatectomy. The AUA defines biochemical recurrence as an initial PSA value 0.2 ng/mL or greater followed by a subsequent confirmatory PSA value 0.2 ng/mL  or greater. Values obtained with different assay methods or kits cannot be used interchangeably. Results cannot be interpreted as absolute evidence of the presence or absence of malignant disease.          Passed - Last BP in normal range    BP Readings from Last 1 Encounters:  11/27/22 122/74         Passed - Valid encounter within last 12 months    Recent Outpatient Visits           2 months ago Welcome to [employer] preventive visit   [place] [facility] [place], Megan P, DO   5 months ago Aortic atherosclerosis [facility])   [place] [facility], [place] P, DO   8 months ago Type 2 diabetes mellitus with other specified complication, without long-term current use of insulin (HCC)   [place] [facility] [place], Megan P, DO   11 months ago Routine general medical examination at a health care facility   [facility] [place], [place] P, DO   1 year ago Type 2 diabetes mellitus with diabetic peripheral angiopathy without gangrene, without long-term current use of insulin (HCC)   [place] [facility] [place], [place], DO       Future Appointments             In 2 weeks Laural Benes, Oralia Rud, DO [place] [facility]             metFORMIN (GLUCOPHAGE) 500 MG tablet [Pharmacy Med Name: METFORMIN HCL TABS 500MG ] 180 tablet 1    Sig: TAKE 2 TABLETS DAILY WITH BREAKFAST     Endocrinology:  Diabetes - Biguanides Failed - 11/27/2022 12:36 AM      Failed - B12 Level in normal range and within 720 days    No results found for: "VITAMINB12"       Passed - Cr in normal range and within 360 days    Creatinine, Ser  Date Value Ref Range Status  09/12/2022 1.04 0.76 - 1.27 mg/dL Final         Passed - HBA1C is between 0 and 7.9 and within 180 days    HB A1C (BAYER DCA - WAIVED)  Date Value Ref Range Status  09/12/2022 7.2 (H) 4.8 - 5.6 %  Final    Comment:              Prediabetes: 5.7 - 6.4          Diabetes: >6.4          Glycemic control for adults with diabetes: <7.0          Passed - eGFR in normal range and within 360 days    GFR calc Af Amer  Date Value Ref Range Status  02/21/2020 89 >59 mL/min/1.73 Final    Comment:    **In accordance with recommendations from the NKF-ASN Task force,**   [facility] is in the process of updating its eGFR calculation to the   2021 CKD-EPI creatinine equation that estimates kidney function   without a race variable.    GFR calc non Af Amer  Date Value Ref Range Status  02/21/2020 77 >59 mL/min/1.73 Final   eGFR  Date Value Ref Range Status  09/12/2022 80 >59 mL/min/1.73 Final         Passed - Valid encounter within last 6 months    Recent Outpatient Visits           2 months ago Welcome to [employer] preventive visit   [place] [facility] [place], Megan P, DO   5 months ago Aortic atherosclerosis (HCC)   [place], Megan P, DO   8 months ago Type 2 diabetes mellitus with other specified complication, without long-term current use of insulin (HCC)   [place] [facility] [place], Megan P, DO   11 months ago Routine general medical examination at a health care facility   [facility], [place] [facility], DO   1 year ago Type 2 diabetes mellitus with diabetic peripheral angiopathy without gangrene, without long-term current use of insulin (HCC)   [place] [facility] [place], [place], DO       Future Appointments             In 2 weeks Laural Benes, Oralia Rud, DO [place] [facility]            Passed - CBC within normal limits and completed in the last 12 months    WBC  Date Value Ref Range Status  09/12/2022 7.4 3.4 - 10.8 x10E3/uL Final   RBC  Date Value Ref Range Status  09/12/2022 5.14 4.14 - 5.80 x10E6/uL Final   Hemoglobin  Date Value Ref Range Status  09/12/2022  16.3 13.0 - 17.7 g/dL Final   Hematocrit  Date Value Ref Range Status  09/12/2022 48.7 37.5 - 51.0 % Final   MCHC  Date Value Ref Range Status  09/12/2022 33.5 31.5 - 35.7 g/dL Final   [facility]  Date Value Ref Range Status  09/12/2022 31.7 26.6 - 33.0 pg Final   MCV  Date Value Ref Range Status  09/12/2022 95 79 - 97 fL Final   No results found for: "PLTCOUNTKUC", "LABPLAT", "POCPLA" RDW  Date Value Ref Range Status  09/12/2022 13.0 11.6 - 15.4 % Final

## 2022-11-27 NOTE — Patient Instructions (Signed)
Medication Instructions:   Your physician recommends the following medication changes.  STOP TAKING: Colesevelam (Welchol)  DECREASE: Aspirin 81 mg daily   *If you need a refill on your cardiac medications before your next appointment, please call your pharmacy*   Lab Work: No labs ordered today    Testing/Procedures: Your physician has requested that you have an echocardiogram. Echocardiography is a painless test that uses sound waves to create images of your heart. It provides your doctor with information about the size and shape of your heart and how well your heart's chambers and valves are working.   You may receive an ultrasound enhancing agent through an IV if needed to better visualize your heart during the echo. This procedure takes approximately one hour.  There are no restrictions for this procedure.  This will take place at [address] [facility]) [address], [place] [address]  Please note: We ask at that you not bring children with you during ultrasound (echo/ vascular) testing. Due to room size and safety concerns, children are not allowed in the ultrasound rooms during exams. Our front office staff cannot provide observation of children in our lobby area while testing is being conducted. An adult accompanying a patient to their appointment will only be allowed in the ultrasound room at the discretion of the ultrasound technician under special circumstances. We apologize for any inconvenience.    Follow-Up: At [facility], you and your health needs are our priority.  As part of our continuing mission to provide you with exceptional heart care, we have created designated Provider Care Teams.  These Care Teams include your primary Cardiologist (physician) and Advanced Practice Providers (APPs -  Physician Assistants and Nurse Practitioners) who all work together to provide you with the care you need, when you need it.  We recommend signing up for the  patient portal called "MyChart".  Sign up information is provided on this After Visit Summary.  MyChart is used to connect with patients for Virtual Visits (Telemedicine).  Patients are able to view lab/test results, encounter notes, upcoming appointments, etc.  Non-urgent messages can be sent to your provider as well.   To learn more about what you can do with MyChart, go to [email or web address].    Your next appointment:   3 month(s)  Provider:   You may see Yvonne Kendall, MD or one of the following Advanced Practice Providers on your designated Care Team:   Nicolasa Ducking, NP Eula Listen, PA-C Cadence Fransico Michael, PA-C Charlsie Quest, NP Carlos Levering, NP

## 2022-11-27 NOTE — Progress Notes (Unsigned)
  Cardiology Office Note:  .   Date:  11/27/2022  ID:  Maxwell Young, DOB 19-May-1955, MRN [id number] PCP: Dorcas Carrow, DO  [facility] Providers Cardiologist:  None { Click to update primary MD,subspecialty MD or APP then REFRESH:1}    History of Present Illness: .   Maxwell Young is a 67 y.o. male with history of coronary artery disease s/p CABG (2008 at [facility]), hypertension, hyperlipidemia, type 2 diabetes mellitus, and GERD, who has been referred by Dr. Laural Benes for ongoing management of his CAD.  CABG in 2008.  Previously saw Dr. Welton Flakes but hasn't seen him for years.  No CP, SOB.  Occ heart racing when would get up to use bathroom.  Only a few seconds.  No LH/syncope.  Leading up to CABG, felt tired and things "didn't feel normal."  PCP set up stress test and went from there.  Nothing reminiscent of before surgery.  Legs sore if walks > 1 mi.  ROS: See HPI  Studies Reviewed: Maxwell Young   EKG Interpretation Date/Time:  Wednesday November 27 2022 15:05:18 EST Ventricular Rate:  72 PR Interval:  160 QRS Duration:  132 QT Interval:  412 QTC Calculation: 451 R Axis:   234  Text Interpretation: Normal sinus rhythm Right bundle branch block Left posterior fasicular block Bifascicular block Abnormal ECG When compared with ECG of 12-Sep-2022 No significant change was found Confirmed by Edman Lipsey, Cristal Deer [phone]) on 11/27/2022 3:13:07 PM    *** Risk Assessment/Calculations:   {Does this patient have ATRIAL FIBRILLATION?[phone]}         Physical Exam:   VS:  BP 122/74 (BP Location: Right Arm, Patient Position: Sitting, Cuff Size: Normal)   Pulse 72   Ht 6' (1.829 m)   Wt 176 lb (79.8 kg)   SpO2 98%   BMI 23.87 kg/m    Wt Readings from Last 3 Encounters:  11/27/22 176 lb (79.8 kg)  09/12/22 173 lb 12.8 oz (78.8 kg)  06/24/22 166 lb 3.2 oz (75.4 kg)    General:  NAD. Neck: No JVD or HJR. Lungs: Clear to auscultation bilaterally without wheezes or crackles. Heart: Regular  rate and rhythm without murmurs, rubs, or gallops. Abdomen: Soft, nontender, nondistended. Extremities: No lower extremity edema.  ASSESSMENT AND PLAN: .    ***    {Are you ordering a CV Procedure (e.g. stress test, cath, DCCV, TEE, etc)?   Press F2        :[id number]}  Dispo: ***  Signed, Yvonne Kendall, MD

## 2022-11-28 ENCOUNTER — Encounter: Payer: Self-pay | Admitting: Internal Medicine

## 2022-11-28 DIAGNOSIS — R9431 Abnormal electrocardiogram [ECG] [EKG]: Secondary | ICD-10-CM | POA: Insufficient documentation

## 2022-12-11 ENCOUNTER — Ambulatory Visit: Payer: Medicare Other | Attending: Internal Medicine

## 2022-12-11 DIAGNOSIS — R9431 Abnormal electrocardiogram [ECG] [EKG]: Secondary | ICD-10-CM | POA: Insufficient documentation

## 2022-12-11 DIAGNOSIS — I2510 Atherosclerotic heart disease of native coronary artery without angina pectoris: Secondary | ICD-10-CM | POA: Diagnosis not present

## 2022-12-11 LAB — ECHOCARDIOGRAM COMPLETE
AR max vel: 3.09 cm2
AV Area VTI: 2.79 cm2
AV Area mean vel: 2.91 cm2
AV Mean grad: 4 mm[Hg]
AV Peak grad: 6.9 mm[Hg]
Ao pk vel: 1.31 m/s
Area-P 1/2: 3.31 cm2
Calc EF: 59.1 %
S' Lateral: 2.9 cm
Single Plane A2C EF: 60.3 %
Single Plane A4C EF: 59 %

## 2022-12-13 ENCOUNTER — Ambulatory Visit: Payer: Medicare Other | Admitting: Family Medicine

## 2022-12-17 ENCOUNTER — Ambulatory Visit: Payer: Medicare Other | Admitting: Family Medicine

## 2022-12-30 ENCOUNTER — Telehealth: Payer: Self-pay | Admitting: Surgery

## 2022-12-30 ENCOUNTER — Encounter: Payer: Self-pay | Admitting: Family Medicine

## 2022-12-30 ENCOUNTER — Ambulatory Visit (INDEPENDENT_AMBULATORY_CARE_PROVIDER_SITE_OTHER): Payer: Medicare Other | Admitting: Family Medicine

## 2022-12-30 ENCOUNTER — Telehealth: Payer: Self-pay

## 2022-12-30 ENCOUNTER — Ambulatory Visit: Payer: Medicare Other | Admitting: Surgery

## 2022-12-30 VITALS — BP 111/70 | HR 67 | Ht 72.0 in | Wt 174.4 lb

## 2022-12-30 VITALS — BP 114/65 | HR 65 | Temp 98.2°F | Ht 72.0 in | Wt 172.0 lb

## 2022-12-30 DIAGNOSIS — E1169 Type 2 diabetes mellitus with other specified complication: Secondary | ICD-10-CM | POA: Diagnosis not present

## 2022-12-30 DIAGNOSIS — Z7984 Long term (current) use of oral hypoglycemic drugs: Secondary | ICD-10-CM | POA: Diagnosis not present

## 2022-12-30 DIAGNOSIS — Z23 Encounter for immunization: Secondary | ICD-10-CM

## 2022-12-30 DIAGNOSIS — K4020 Bilateral inguinal hernia, without obstruction or gangrene, not specified as recurrent: Secondary | ICD-10-CM | POA: Diagnosis not present

## 2022-12-30 LAB — BAYER DCA HB A1C WAIVED: HB A1C (BAYER DCA - WAIVED): 9.6 % — ABNORMAL HIGH (ref 4.8–5.6)

## 2022-12-30 MED ORDER — METFORMIN HCL 500 MG PO TABS: 1000.0000 mg | ORAL_TABLET | Freq: Two times a day (BID) | ORAL | 1 refills | Status: DC

## 2022-12-30 NOTE — Assessment & Plan Note (Signed)
Not doing well with A1 of 9.6- will increase his metformin to 1000mg  BID and recheck in 3 months. Call with any concerns.

## 2022-12-30 NOTE — Telephone Encounter (Signed)
Requested Eye Exam has been printed from Care Everywhere to be abstracted into patient's chart under Health Maintenance.

## 2022-12-30 NOTE — Telephone Encounter (Signed)
Patient calls back, he is now informed of all dates regarding surgery.

## 2022-12-30 NOTE — Progress Notes (Signed)
Outpatient Surgical Follow Up  12/30/2022  Maxwell Young is an 67 y.o. male.   Chief Complaint  Patient presents with   Follow-up    HPI:  Maxwell Young is a 67 y.o. male seen iin F/U bilateral inguinal hernias.  Patient reports that he has noticed a bulge mainly on the right side but currently is asymptomatic.  He reports that there is no abdominal pain.  He does feel a bulge that he is able to reduce and this worsens when he exerts himself or does some Valsalva maneuvers  He does have a significant medical history to include coronary artery disease, diabetes, GERD, hypertension hyperlipidemia and COPD.  He did have a history of a CABG, [facility]  6 years ago.  Currently he denies any angina or dyspnea on exertion. He is a current smoker of 1 pack a day. Did have a CT of the chest that I have personally reviewed showing evidence of gallstones and emphysema.  Inguinal area not reached CBC and CMP is completely normal.  He is able to perform more than 4 METS of activity without shortness of breath or chest pain.  Past Medical History:  Diagnosis Date   CAD (coronary artery disease)    Colon polyps 01/21/2009   Diabetes mellitus without complication (HCC)    Diverticula of colon 01/21/2009   Elevated fasting glucose    GERD (gastroesophageal reflux disease)    History of hemorrhoids 01/21/2009   internal   Hyperlipidemia    Hypertension    Personal history of tobacco use, presenting hazards to health 07/27/2015   Wears dentures    full upper and lower    Past Surgical History:  Procedure Laterality Date   CATARACT EXTRACTION Right 03/04/2022   CERVICAL FUSION  06/09/003   COLONOSCOPY     COLONOSCOPY N/A 09/14/2020   Procedure: COLONOSCOPY;  Surgeon: Midge Minium, MD;  Location: [facility];  Service: Endoscopy;  Laterality: N/A;   COLONOSCOPY WITH PROPOFOL N/A 07/24/2015   Procedure: COLONOSCOPY WITH PROPOFOL;  Surgeon: Midge Minium, MD;  Location: [facility];   Service: Endoscopy;  Laterality: N/A;   CORONARY ARTERY BYPASS GRAFT  01/21/2006   [facility] - 3 vessel   POLYPECTOMY  07/24/2015   Procedure: POLYPECTOMY;  Surgeon: Midge Minium, MD;  Location: [facility];  Service: Endoscopy;;    Family History  Problem Relation Age of Onset   Hypertension Mother    Heart disease Mother        CAD   Heart failure Mother    Cancer Father        colon, prostate   Stroke Father    Parkinson's disease Father     Social History:  reports that he has quit smoking. His smoking use included cigarettes and e-cigarettes. He started smoking about 41 years ago. He has a 40 pack-year smoking history. He has been exposed to tobacco smoke. He has never used smokeless tobacco. He reports that he does not drink alcohol and does not use drugs.  Allergies: No Known Allergies  Medications reviewed.    ROS Full ROS performed and is otherwise negative other than what is stated in HPI   BP 114/65   Pulse 65   Temp 98.2 F (36.8 C)   Ht 6' (1.829 m)   Wt 172 lb (78 kg)   SpO2 97%   BMI 23.33 kg/m   Physical Exam  CONSTITUTIONAL: NAD. EYES: Pupils are equal, round, and reactive to light, Sclera are non-icteric.  EARS, NOSE, MOUTH AND THROAT: The oropharynx is clear. The oral mucosa is pink and moist. Hearing is intact to voice. LYMPH NODES:  Lymph nodes in the neck are normal. RESPIRATORY:  Lungs are clear. There is normal respiratory effort, with equal breath sounds bilaterally, and without pathologic use of accessory muscles. CARDIOVASCULAR: Heart is regular without murmurs, gallops, or rubs. GI: The abdomen is  soft, nontender, and nondistended. There are no palpable masses. There is no hepatosplenomegaly. There are normal bowel sounds .  He does have bilateral inguinal hernias that are reducible.  They are not tender to palpation.  There is no evidence of peritonitis GU: Rectal deferred.   MUSCULOSKELETAL: Normal muscle strength and tone. No  cyanosis or edema.   SKIN: Turgor is good and there are no pathologic skin lesions or ulcers. NEUROLOGIC: Motor and sensation is grossly normal. Cranial nerves are grossly intact. PSYCH:  Oriented to person, place and time. Affect is normal.    Assessment/Plan: 67 year old male with bilateral inguinal hernias .  I had an extensive discussion with the patient regarding his disease process.He wishes to go ahead and have them fixed before the end of the year.   I do think he would be a good candidate for robotic approach.  Procedure d/w the pt in detail. Risks, benefits and possible complications including but not limited to chronic pain, recurrence ,infection..  Patient understands.  Please note I spent 40 minutes in this encounter including extensive counseling, personally reviewing imaging studies, coordinating his care and performing appropriate documentation  Maxwell Big, MD [facility] General Surgeon

## 2022-12-30 NOTE — Telephone Encounter (Signed)
-----   Message from Olevia Perches sent at 12/30/2022  4:00 PM EST ----- Eye exam on care everywhere 03/05/22- can we please print for abstraction, my printer is on the fritz

## 2022-12-30 NOTE — H&P (View-Only) (Signed)
 Outpatient Surgical Follow Up  12/30/2022  Maxwell Young is an 67 y.o. male.   Chief Complaint  Patient presents with   Follow-up    HPI:  Maxwell Young is a 67 y.o. male seen iin F/U bilateral inguinal hernias.  Patient reports that he has noticed a bulge mainly on the right side but currently is asymptomatic.  He reports that there is no abdominal pain.  He does feel a bulge that he is able to reduce and this worsens when he exerts himself or does some Valsalva maneuvers  He does have a significant medical history to include coronary artery disease, diabetes, GERD, hypertension hyperlipidemia and COPD.  He did have a history of a CABG, [facility]  6 years ago.  Currently he denies any angina or dyspnea on exertion. He is a current smoker of 1 pack a day. Did have a CT of the chest that I have personally reviewed showing evidence of gallstones and emphysema.  Inguinal area not reached CBC and CMP is completely normal.  He is able to perform more than 4 METS of activity without shortness of breath or chest pain.  Past Medical History:  Diagnosis Date   CAD (coronary artery disease)    Colon polyps 01/21/2009   Diabetes mellitus without complication (HCC)    Diverticula of colon 01/21/2009   Elevated fasting glucose    GERD (gastroesophageal reflux disease)    History of hemorrhoids 01/21/2009   internal   Hyperlipidemia    Hypertension    Personal history of tobacco use, presenting hazards to health 07/27/2015   Wears dentures    full upper and lower    Past Surgical History:  Procedure Laterality Date   CATARACT EXTRACTION Right 03/04/2022   CERVICAL FUSION  06/09/003   COLONOSCOPY     COLONOSCOPY N/A 09/14/2020   Procedure: COLONOSCOPY;  Surgeon: Midge Minium, MD;  Location: [facility];  Service: Endoscopy;  Laterality: N/A;   COLONOSCOPY WITH PROPOFOL N/A 07/24/2015   Procedure: COLONOSCOPY WITH PROPOFOL;  Surgeon: Midge Minium, MD;  Location: [facility];   Service: Endoscopy;  Laterality: N/A;   CORONARY ARTERY BYPASS GRAFT  01/21/2006   [facility] - 3 vessel   POLYPECTOMY  07/24/2015   Procedure: POLYPECTOMY;  Surgeon: Midge Minium, MD;  Location: [facility];  Service: Endoscopy;;    Family History  Problem Relation Age of Onset   Hypertension Mother    Heart disease Mother        CAD   Heart failure Mother    Cancer Father        colon, prostate   Stroke Father    Parkinson's disease Father     Social History:  reports that he has quit smoking. His smoking use included cigarettes and e-cigarettes. He started smoking about 41 years ago. He has a 40 pack-year smoking history. He has been exposed to tobacco smoke. He has never used smokeless tobacco. He reports that he does not drink alcohol and does not use drugs.  Allergies: No Known Allergies  Medications reviewed.    ROS Full ROS performed and is otherwise negative other than what is stated in HPI   BP 114/65   Pulse 65   Temp 98.2 F (36.8 C)   Ht 6' (1.829 m)   Wt 172 lb (78 kg)   SpO2 97%   BMI 23.33 kg/m   Physical Exam  CONSTITUTIONAL: NAD. EYES: Pupils are equal, round, and reactive to light, Sclera are non-icteric.  EARS, NOSE, MOUTH AND THROAT: The oropharynx is clear. The oral mucosa is pink and moist. Hearing is intact to voice. LYMPH NODES:  Lymph nodes in the neck are normal. RESPIRATORY:  Lungs are clear. There is normal respiratory effort, with equal breath sounds bilaterally, and without pathologic use of accessory muscles. CARDIOVASCULAR: Heart is regular without murmurs, gallops, or rubs. GI: The abdomen is  soft, nontender, and nondistended. There are no palpable masses. There is no hepatosplenomegaly. There are normal bowel sounds .  He does have bilateral inguinal hernias that are reducible.  They are not tender to palpation.  There is no evidence of peritonitis GU: Rectal deferred.   MUSCULOSKELETAL: Normal muscle strength and tone. No  cyanosis or edema.   SKIN: Turgor is good and there are no pathologic skin lesions or ulcers. NEUROLOGIC: Motor and sensation is grossly normal. Cranial nerves are grossly intact. PSYCH:  Oriented to person, place and time. Affect is normal.    Assessment/Plan: 67 year old male with bilateral inguinal hernias .  I had an extensive discussion with the patient regarding his disease process.He wishes to go ahead and have them fixed before the end of the year.   I do think he would be a good candidate for robotic approach.  Procedure d/w the pt in detail. Risks, benefits and possible complications including but not limited to chronic pain, recurrence ,infection..  Patient understands.  Please note I spent 40 minutes in this encounter including extensive counseling, personally reviewing imaging studies, coordinating his care and performing appropriate documentation  Sterling Big, MD [facility] General Surgeon

## 2022-12-30 NOTE — Patient Instructions (Signed)
 You have chose to have your hernia repaired. This will be done by Dr. Everlene Farrier at [facility].  Please see your (blue) Pre-care information that you have been given today. Our surgery scheduler will call you to verify surgery date and to go over information.   You will need to arrange to be out of work for approximately 1-2 weeks and then you may return with a lifting restriction for 4 more weeks. If you have FMLA or Disability paperwork that needs to be filled out, please have your company fax your paperwork to [phone] or you may drop this by either office. This paperwork will be filled out within 3 days after your surgery has been completed.  You may have a bruise in your groin and also swelling and brusing in your testicle area. You may use ice 4-5 times daily for 15-20 minutes each time. Make sure that you place a barrier between you and the ice pack. To decrease the swelling, you may roll up a bath towel and place it vertically in between your thighs with your testicles resting on the towel. You will want to keep this area elevated as much as possible for several days following surgery.    Inguinal Hernia, Adult Muscles help keep everything in the body in its proper place. But if a weak spot in the muscles develops, something can poke through. That is called a hernia. When this happens in the lower part of the belly (abdomen), it is called an inguinal hernia. (It takes its name from a part of the body in this region called the inguinal canal.) A weak spot in the wall of muscles lets some fat or part of the small intestine bulge through. An inguinal hernia can develop at any age. Men get them more often than women. CAUSES  In adults, an inguinal hernia develops over time. It can be triggered by: Suddenly straining the muscles of the lower abdomen. Lifting heavy objects. Straining to have a bowel movement. Difficult bowel movements (constipation) can lead to this. Constant coughing. This  may be caused by smoking or lung disease. Being overweight. Being pregnant. Working at a job that requires long periods of standing or heavy lifting. Having had an inguinal hernia before. One type can be an emergency situation. It is called a strangulated inguinal hernia. It develops if part of the small intestine slips through the weak spot and cannot get back into the abdomen. The blood supply can be cut off. If that happens, part of the intestine may die. This situation requires emergency surgery. SYMPTOMS  Often, a small inguinal hernia has no symptoms. It is found when a healthcare provider does a physical exam. Larger hernias usually have symptoms.  In adults, symptoms may include: A lump in the groin. This is easier to see when the person is standing. It might disappear when lying down. In men, a lump in the scrotum. Pain or burning in the groin. This occurs especially when lifting, straining or coughing. A dull ache or feeling of pressure in the groin. Signs of a strangulated hernia can include: A bulge in the groin that becomes very painful and tender to the touch. A bulge that turns red or purple. Fever, nausea and vomiting. Inability to have a bowel movement or to pass gas. DIAGNOSIS  To decide if you have an inguinal hernia, a healthcare provider will probably do a physical examination. This will include asking questions about any symptoms you have noticed. The healthcare provider might  feel the groin area and ask you to cough. If an inguinal hernia is felt, the healthcare provider may try to slide it back into the abdomen. Usually no other tests are needed. TREATMENT  Treatments can vary. The size of the hernia makes a difference. Options include: Watchful waiting. This is often suggested if the hernia is small and you have had no symptoms. No medical procedure will be done unless symptoms develop. You will need to watch closely for symptoms. If any occur, contact your  healthcare provider right away. Surgery. This is used if the hernia is larger or you have symptoms. Open surgery. This is usually an outpatient procedure (you will not stay overnight in a hospital). An cut (incision) is made through the skin in the groin. The hernia is put back inside the abdomen. The weak area in the muscles is then repaired by herniorrhaphy or hernioplasty. Herniorrhaphy: in this type of surgery, the weak muscles are sewn back together. Hernioplasty: a patch or mesh is used to close the weak area in the abdominal wall. Laparoscopy. In this procedure, a surgeon makes small incisions. A thin tube with a tiny video camera (called a laparoscope) is put into the abdomen. The surgeon repairs the hernia with mesh by looking with the video camera and using two long instruments. HOME CARE INSTRUCTIONS  After surgery to repair an inguinal hernia: You will need to take pain medicine prescribed by your healthcare provider. Follow all directions carefully. You will need to take care of the wound from the incision. Your activity will be restricted for awhile. This will probably include no heavy lifting for several weeks. You also should not do anything too active for a few weeks. When you can return to work will depend on the type of job that you have. During "watchful waiting" periods, you should: Maintain a healthy weight. Eat a diet high in fiber (fruits, vegetables and whole grains). Drink plenty of fluids to avoid constipation. This means drinking enough water and other liquids to keep your urine clear or pale yellow. Do not lift heavy objects. Do not stand for long periods of time. Quit smoking. This should keep you from developing a frequent cough. SEEK MEDICAL CARE IF:  A bulge develops in your groin area. You feel pain, a burning sensation or pressure in the groin. This might be worse if you are lifting or straining. You develop a fever of more than 100.5 F (38.1 C). SEEK  IMMEDIATE MEDICAL CARE IF:  Pain in the groin increases suddenly. A bulge in the groin gets bigger suddenly and does not go down. For men, there is sudden pain in the scrotum. Or, the size of the scrotum increases. A bulge in the groin area becomes red or purple and is painful to touch. You have nausea or vomiting that does not go away. You feel your heart beating much faster than normal. You cannot have a bowel movement or pass gas. You develop a fever of more than 102.0 F (38.9 C).   This information is not intended to replace advice given to you by your health care provider. Make sure you discuss any questions you have with your health care provider.   Document Released: 05/26/2008 Document Revised: 04/01/2011 Document Reviewed: 07/11/2014 Elsevier Interactive Patient Education [employer]

## 2022-12-30 NOTE — Progress Notes (Unsigned)
BP 111/70   Pulse 67   Ht 6' (1.829 m)   Wt 174 lb 6.4 oz (79.1 kg)   SpO2 98%   BMI 23.65 kg/m    Subjective:    Patient ID: Maxwell Young, male    DOB: May 29, 1955, 67 y.o.   MRN: [id number]  HPI: [name] is a 67 y.o. male  Chief Complaint  Patient presents with   Diabetes   DIABETES- has been cheating a lot, feels like his sugars are not going to be great Hypoglycemic episodes:no Polydipsia/polyuria: no Visual disturbance: no Chest pain: no Paresthesias: no Glucose Monitoring: no  Accucheck frequency: Not Checking Taking Insulin?: no Blood Pressure Monitoring: not checking Retinal Examination: Up to Date Foot Exam: Up to Date Diabetic Education: Completed Pneumovax: Up to Date Influenza: Up to Date Aspirin: yes   Relevant past medical, surgical, family and social history reviewed and updated as indicated. Interim medical history since our last visit reviewed. Allergies and medications reviewed and updated.  Review of Systems  Constitutional: Negative.   Respiratory: Negative.    Cardiovascular: Negative.   Musculoskeletal: Negative.   Psychiatric/Behavioral: Negative.      Per HPI unless specifically indicated above     Objective:    BP 111/70   Pulse 67   Ht 6' (1.829 m)   Wt 174 lb 6.4 oz (79.1 kg)   SpO2 98%   BMI 23.65 kg/m   Wt Readings from Last 3 Encounters:  12/30/22 174 lb 6.4 oz (79.1 kg)  12/30/22 172 lb (78 kg)  11/27/22 176 lb (79.8 kg)    Physical Exam Vitals and nursing note reviewed.  Constitutional:      General: He is not in acute distress.    Appearance: Normal appearance. He is normal weight. He is not ill-appearing, toxic-appearing or diaphoretic.  HENT:     Head: Normocephalic and atraumatic.     Right Ear: External ear normal.     Left Ear: External ear normal.     Nose: Nose normal.     Mouth/Throat:     Mouth: Mucous membranes are moist.     Pharynx: Oropharynx is clear.  Eyes:     General: No  scleral icterus.       Right eye: No discharge.        Left eye: No discharge.     Extraocular Movements: Extraocular movements intact.     Conjunctiva/sclera: Conjunctivae normal.     Pupils: Pupils are equal, round, and reactive to light.  Cardiovascular:     Rate and Rhythm: Normal rate and regular rhythm.     Pulses: Normal pulses.     Heart sounds: Normal heart sounds. No murmur heard.    No friction rub. No gallop.  Pulmonary:     Effort: Pulmonary effort is normal. No respiratory distress.     Breath sounds: Normal breath sounds. No stridor. No wheezing, rhonchi or rales.  Chest:     Chest wall: No tenderness.  Musculoskeletal:        General: Normal range of motion.     Cervical back: Normal range of motion and neck supple.  Skin:    General: Skin is warm and dry.     Capillary Refill: Capillary refill takes less than 2 seconds.     Coloration: Skin is not jaundiced or pale.     Findings: No bruising, erythema, lesion or rash.  Neurological:     General: No focal deficit present.  Mental Status: He is alert and oriented to person, place, and time. Mental status is at baseline.  Psychiatric:        Mood and Affect: Mood normal.        Behavior: Behavior normal.        Thought Content: Thought content normal.        Judgment: Judgment normal.     Results for orders placed or performed in visit on 12/11/22  ECHOCARDIOGRAM COMPLETE  Result Value Ref Range   AR max vel 3.09 cm2   AV Peak grad 6.9 mmHg   Ao pk vel 1.31 m/s   S' Lateral 2.90 cm   Area-P 1/2 3.31 cm2   AV Area VTI 2.79 cm2   AV Mean grad 4.0 mmHg   Single Plane A4C EF 59.0 %   Single Plane A2C EF 60.3 %   Calc EF 59.1 %   AV Area mean vel 2.91 cm2   Est EF 60 - 65%       Assessment & Plan:   Problem List Items Addressed This Visit       Endocrine   Type 2 diabetes mellitus with other specified complication (HCC) - Primary    Not doing well with A1 of 9.6- will increase his metformin to  1000mg  BID and recheck in 3 months. Call with any concerns.       Relevant Medications   metFORMIN (GLUCOPHAGE) 500 MG tablet   Other Relevant Orders   Bayer DCA Hb A1c Waived   Other Visit Diagnoses     Needs flu shot       Flu shot given today.   Relevant Orders   Flu Vaccine Trivalent High Dose (Fluad)        Follow up plan: Return in about 3 months (around 03/30/2023).

## 2022-12-30 NOTE — Telephone Encounter (Signed)
Unable to leave message, voice mailbox not set up.  If patient calls back, please inform him of the following regarding scheduled surgery with Dr. Everlene Farrier.   Pre-Admission date/time, and Surgery date at [facility].  Surgery Date: 01/07/23 Preadmission Testing Date: 01/02/23 (phone 1p-4p)  Also patient will need to call at [phone], between 1-3:00pm the day before surgery, to find out what time to arrive for surgery.

## 2022-12-31 DIAGNOSIS — Z23 Encounter for immunization: Secondary | ICD-10-CM | POA: Diagnosis not present

## 2023-01-02 ENCOUNTER — Telehealth: Payer: Self-pay | Admitting: *Deleted

## 2023-01-02 ENCOUNTER — Encounter
Admission: RE | Admit: 2023-01-02 | Discharge: 2023-01-02 | Disposition: A | Discharge status: Home / Self Care | Payer: Medicare Other | Source: Ambulatory Visit | Attending: Surgery | Admitting: Surgery

## 2023-01-02 ENCOUNTER — Other Ambulatory Visit: Payer: Self-pay

## 2023-01-02 DIAGNOSIS — I10 Essential (primary) hypertension: Secondary | ICD-10-CM

## 2023-01-02 DIAGNOSIS — E1169 Type 2 diabetes mellitus with other specified complication: Secondary | ICD-10-CM

## 2023-01-02 NOTE — Patient Instructions (Addendum)
Your procedure is scheduled on: 01/07/23 - Tuesday Report to the Registration Desk on the 1st floor of the [facility]. To find out your arrival time, please call [phone] between 1PM - 3PM on: 01/06/23 - Monday If your arrival time is 6:00 am, do not arrive before that time as the [facility] entrance doors do not open until 6:00 am.  REMEMBER: Instructions that are not followed completely may result in serious medical risk, up to and including death; or upon the discretion of your surgeon and anesthesiologist your surgery may need to be rescheduled.  Do not eat food after midnight the night before surgery.  No gum chewing or hard candies.  You may drink water up to 2 hours before you are scheduled to arrive for your surgery. Do not drink anything within 2 hours of your scheduled arrival time.  One week prior to surgery: Stop Anti-inflammatories (NSAIDS) such as Advil, Aleve, Ibuprofen, Motrin, Naproxen, Naprosyn and Aspirin based products such as Excedrin, Goody's Powder, BC Powder. You may take Tylenol if needed for pain up until the day of surgery.  Stop ANY OVER THE COUNTER supplements until after surgery :Multiple Vitamins-Minerals    HOLD metFORMIN (GLUCOPHAGE) beginning 01/05/23, may resume after surgery.  Hold Aspirin beginning 01/02/23, patient reports last dose was 01/01/23.   ON THE DAY OF SURGERY ONLY TAKE THESE MEDICATIONS WITH SIPS OF WATER:  amLODipine (NORVASC)  metoprolol succinate (TOPROL-XL)  rosuvastatin (CRESTOR)  omeprazole (PRILOSEC)  LOVAZA  tamsulosin (FLOMAX)    No Alcohol for 24 hours before or after surgery.  No Smoking including e-cigarettes for 24 hours before surgery.  No chewable tobacco products for at least 6 hours before surgery.  No nicotine patches on the day of surgery.  Do not use any "recreational" drugs for at least a week (preferably 2 weeks) before your surgery.  Please be advised that the combination of cocaine and  anesthesia may have negative outcomes, up to and including death. If you test positive for cocaine, your surgery will be cancelled.  On the morning of surgery brush your teeth with toothpaste and water, you may rinse your mouth with mouthwash if you wish. Do not swallow any toothpaste or mouthwash.  Use CHG Soap or wipes as directed on instruction sheet.  Do not wear jewelry, make-up, hairpins, clips or nail polish.  For welded (permanent) jewelry: bracelets, anklets, waist bands, etc.  Please have this removed prior to surgery.  If it is not removed, there is a chance that hospital personnel will need to cut it off on the day of surgery.  Do not wear lotions, powders, or perfumes.   Do not shave body hair from the neck down 48 hours before surgery.  Contact lenses, hearing aids and dentures may not be worn into surgery.  Do not bring valuables to the hospital. [facility] is not responsible for any missing/lost belongings or valuables.   Notify your doctor if there is any change in your medical condition (cold, fever, infection).  Wear comfortable clothing (specific to your surgery type) to the hospital.  After surgery, you can help prevent lung complications by doing breathing exercises.  Take deep breaths and cough every 1-2 hours. Your doctor may order a device called an Incentive Spirometer to help you take deep breaths. When coughing or sneezing, hold a pillow firmly against your incision with both hands. This is called "splinting." Doing this helps protect your incision. It also decreases belly discomfort.  If you are being  admitted to the hospital overnight, leave your suitcase in the car. After surgery it may be brought to your room.  In case of increased patient census, it may be necessary for you, the patient, to continue your postoperative care in the Same Day Surgery department.  If you are being discharged the day of surgery, you will not be allowed to drive home. You  will need a responsible individual to drive you home and stay with you for 24 hours after surgery.   If you are taking public transportation, you will need to have a responsible individual with you.  Please call the Pre-admissions Testing Dept. at [phone] if you have any questions about these instructions.  Surgery Visitation Policy:  Patients having surgery or a procedure may have two visitors.  Children under the age of 50 must have an adult with them who is not the patient.  Inpatient Visitation:    Visiting hours are 7 a.m. to 8 p.m. Up to four visitors are allowed at one time in a patient room. The visitors may rotate out with other people during the day.  One visitor age 29 or older may stay with the patient overnight and must be in the room by 8 p.m.     Preparing for Surgery with CHLORHEXIDINE GLUCONATE (CHG) Soap  Chlorhexidine Gluconate (CHG) Soap  o An antiseptic cleaner that kills germs and bonds with the skin to continue killing germs even after washing  o Used for showering the night before surgery and morning of surgery  Before surgery, you can play an important role by reducing the number of germs on your skin.  CHG (Chlorhexidine gluconate) soap is an antiseptic cleanser which kills germs and bonds with the skin to continue killing germs even after washing.  Please do not use if you have an allergy to CHG or antibacterial soaps. If your skin becomes reddened/irritated stop using the CHG.  1. Shower the NIGHT BEFORE SURGERY and the MORNING OF SURGERY with CHG soap.  2. If you choose to wash your hair, wash your hair first as usual with your normal shampoo.  3. After shampooing, rinse your hair and body thoroughly to remove the shampoo.  4. Use CHG as you would any other liquid soap. You can apply CHG directly to the skin and wash gently with a scrungie or a clean washcloth.  5. Apply the CHG soap to your body only from the neck down. Do not use on open  wounds or open sores. Avoid contact with your eyes, ears, mouth, and genitals (private parts). Wash face and genitals (private parts) with your normal soap.  6. Wash thoroughly, paying special attention to the area where your surgery will be performed.  7. Thoroughly rinse your body with warm water.  8. Do not shower/wash with your normal soap after using and rinsing off the CHG soap.  9. Pat yourself dry with a clean towel.  10. Wear clean pajamas to bed the night before surgery.  12. Place clean sheets on your bed the night of your first shower and do not sleep with pets.  13. Shower again with the CHG soap on the day of surgery prior to arriving at the hospital.  14. Do not apply any deodorants/lotions/powders.  15. Please wear clean clothes to the hospital.

## 2023-01-02 NOTE — Telephone Encounter (Signed)
-----   Message from Verlee Monte sent at 01/02/2023  3:41 PM EST ----- Regarding: Request for pre-operative cardiac clearance Request for pre-operative cardiac clearance:  1. What type of surgery is being performed?  XI ROBOTIC ASSISTED BILATERAL INGUINAL HERNIA  2. When is this surgery scheduled?  01/07/2023  3. Type of clearance being requested (medical, pharmacy, both)? MEDICAL   4. Are there any medications that need to be held prior to surgery? ASA  5. Practice name and name of physician performing surgery?  Performing surgeon: Dr. Sterling Big, MD Requesting clearance: Maxwell Mulling, FNP-C    6. Anesthesia type (none, local, MAC, general)? GENERAL  7. What is the office phone and fax number?   Phone: [phone] Fax: [phone]  ATTENTION: Unable to create telephone message as per your standard workflow. Directed by [facility] providers to send requests for cardiac clearance to this pool for appropriate distribution to provider covering pre-operative clearances.   Maxwell Mulling, MSN, APRN, FNP-C, CEN [facility]  Peri-operative Services Nurse Practitioner Phone: [phone] 01/02/23 3:41 PM

## 2023-01-02 NOTE — Telephone Encounter (Signed)
Dr. Okey Dupre  You saw this patient on 11/29/2022. Per protocol we request that you comment on his cardiac risk to proceed with XI ROBOTIC ASSISTED BILATERAL INGUINAL HERNIA on 01/07/2023, since it has been less than 2 months since evaluated in the office. Please send your comment to P CV Pre-Op Pool.  Thank you, Joni Reining DNP, ANP, AACC.

## 2023-01-03 ENCOUNTER — Encounter
Admission: RE | Admit: 2023-01-03 | Discharge: 2023-01-03 | Disposition: A | Discharge status: Home / Self Care | Payer: Medicare Other | Source: Ambulatory Visit | Attending: Surgery | Admitting: Surgery

## 2023-01-03 ENCOUNTER — Encounter: Payer: Self-pay | Admitting: Surgery

## 2023-01-03 DIAGNOSIS — Z01812 Encounter for preprocedural laboratory examination: Secondary | ICD-10-CM | POA: Insufficient documentation

## 2023-01-03 DIAGNOSIS — I10 Essential (primary) hypertension: Secondary | ICD-10-CM | POA: Insufficient documentation

## 2023-01-03 LAB — BASIC METABOLIC PANEL
Anion gap: 12 (ref 5–15)
BUN: 17 mg/dL (ref 8–23)
CO2: 23 mmol/L (ref 22–32)
Calcium: 8.6 mg/dL — ABNORMAL LOW (ref 8.9–10.3)
Chloride: 97 mmol/L — ABNORMAL LOW (ref 98–111)
Creatinine, Ser: 0.89 mg/dL (ref 0.61–1.24)
GFR, Estimated: >60 mL/min (ref >60)
Glucose, Bld: 294 mg/dL — ABNORMAL HIGH (ref 70–99)
Potassium: 3.9 mmol/L (ref 3.5–5.1)
Sodium: 132 mmol/L — ABNORMAL LOW (ref 135–145)

## 2023-01-03 LAB — CBC
HCT: 45.4 % (ref 39.0–52.0)
Hemoglobin: 15.9 g/dL (ref 13.0–17.0)
MCH: 31.1 pg (ref 26.0–34.0)
MCHC: 35 g/dL (ref 30.0–36.0)
MCV: 88.8 fL (ref 80.0–100.0)
Platelets: 152 10*3/uL (ref 150–400)
RBC: 5.11 MIL/uL (ref 4.22–5.81)
RDW: 12.5 % (ref 11.5–15.5)
WBC: 8.4 10*3/uL (ref 4.0–10.5)
nRBC: 0 % (ref 0.0–0.2)

## 2023-01-03 NOTE — Progress Notes (Incomplete)
Perioperative / Anesthesia Services  Pre-Admission Testing Clinical Review / Pre-Operative Anesthesia Consult  Date: 01/03/23  Patient Demographics:  Name: Maxwell Young DOB: [birth year] MRN:   [id number]  Planned Surgical Procedure(s):    Case: [id number] Date/Time: 01/07/23 0715   Procedure: XI ROBOTIC ASSISTED BILATERAL INGUINAL HERNIA (Bilateral)   Anesthesia type: General   Pre-op diagnosis: bilateral inguinal hernia   Location: [facility] OR ROOM 07 / [facility] ORS FOR ANESTHESIA GROUP   Surgeons: Leafy Ro, MD     NOTE: Available PAT nursing documentation and vital signs have been reviewed. Clinical nursing staff has updated patient's PMH/PSHx, current medication list, and drug allergies/intolerances to ensure comprehensive history available to assist in medical decision making as it pertains to the aforementioned surgical procedure and anticipated anesthetic course. Extensive review of available clinical information personally performed. [place] [facility] and PSHx updated with any diagnoses/procedures that  may have been inadvertently omitted during his intake with the pre-admission testing department's nursing staff.  Clinical Discussion:  Maxwell Young is a 67 y.o. male who is submitted for pre-surgical anesthesia review and clearance prior to him undergoing the above procedure. Patient is a Former Smoker (40 pack years). Pertinent PMH includes: CAD (s/p CABG), diastolic dysfunction, aortic ectasia, bifascicular block (RBBB + LPFB), aortic atherosclerosis, HTN, HLD, T2DM, emphysema, GERD (on daily PPI), cervical DDD (s/p C4-C6 interbody fusion), BILATERAL inguinal hernia.  Patient is followed by cardiology (End, MD). He was last seen in the cardiology clinic on 11/27/2022; notes reviewed. At the time of his clinic visit, patient doing well overall from a cardiovascular perspective. Patient denied any chest pain, shortness of breath, PND, orthopnea, palpitations, significant peripheral  edema, weakness, fatigue, vertiginous symptoms, or presyncope/syncope. Patient with a past medical history significant for cardiovascular diagnoses. Documented physical exam was grossly benign, providing no evidence of acute exacerbation and/or decompensation of the patient's known cardiovascular conditions.  Patient underwent diagnostic LEFT heart catheterization on 11/13/2006 revealing multivessel CAD; 90% mid LAD, 70% D1, 60% D2, 70% proximal LCx, 100% proximal RCA, and 95% RV marginal stenosis.  Given the degree and complexity of patient's coronary artery disease, patient was referred to CVTS for consideration of revascularization.  Patient underwent three-vessel revascularization procedure on 11/21/2006.  LIMA-LAD, SVG-OM2, and SVG-PDA bypass grafts were placed.  Most recent TTE was performed on 12/11/2022 revealing normal left ventricular systolic function with an EF of 60 to 65%.  There were no regional wall motion abnormalities. Left ventricular diastolic Doppler parameters consistent with abnormal relaxation (G1DD).  Right ventricular size and function normal.  There was mild mitral valve regurgitation.  There was mild to moderate tricuspid valve regurgitation.  Mild calcification of the aortic valve was observed. All transvalvular gradients were noted to be normal providing no evidence suggestive of valvular stenosis. Aorta normal in size with no evidence of aneurysmal dilatation.  Blood pressure well controlled at 122/74 mmHg on currently prescribed CCB (amlodipine) and beta-blocker (metoprolol succinate) therapies.  Patient is on rosuvastatin + Lovaza for his HLD diagnosis and ASCVD prevention.  T2DM reasonably controlled on currently prescribed regimen; A1c was 7.2%. Of note, since patient was last seen by cardiology, his A1c has been rechecked with further worsening to 9.6% when checked on 12/30/2022.  Patient does not have an OSAH diagnosis. Patient is able to complete all of his  ADL/IADLs  without cardiovascular limitation.  Per the DASI, patient is able to achieve at least 4 METS of physical activity without experiencing any significant degree of angina/anginal equivalent  symptoms.  No changes were made to his medication regimen.  Patient to follow-up with outpatient cardiology in 3 months or sooner if needed.  Maxwell Young is scheduled for an elective XI ROBOTIC ASSISTED BILATERAL INGUINAL HERNIA (Bilateral) on 01/07/2023 with Dr. Sterling Big, MD.  Given patient's past medical history significant for cardiovascular diagnoses, presurgical cardiac clearance was sought by the PAT team. Per cardiology, "based on our recent visit, Mr. Scallan can proceed with his inguinal hernia repair, which is low to intermediate risk from a perioperative standpoint, without additional cardiovascular testing or intervention".  In review of the patient's chart, it is noted that he is on daily oral antithrombotic therapy. He has been instructed on recommendations for holding his daily low-dose ASA for 5 days prior to his procedure with plans to restart as soon as postoperative bleeding risk felt to be minimized by his attending surgeon. The patient has been instructed that his last dose of ASA should be on 01/01/2023.  Patient denies previous perioperative complications with anesthesia in the past. In review his EMR, it is noted that patient underwent a MAC course at [facility] (ASA II) in 02/2022 without documented complications.      12/30/2022    3:18 PM 12/30/2022    1:21 PM 11/27/2022    3:05 PM  Vitals with BMI  Height 6\' 0"  6\' 0"  6\' 0"   Weight 174 lbs 6 oz 172 lbs 176 lbs  BMI 23.65 23.32 23.86  Systolic 111 114 161  Diastolic 70 65 74  Pulse 67 65 72   Providers/Specialists:  NOTE: Primary physician provider listed below. Patient may have been seen by APP or partner within same practice.   PROVIDER ROLE / SPECIALTY LAST Cherly Hensen, MD  General Surgery (Surgeon) 12/30/2022  Dorcas Carrow, DO Primary Care Provider 12/30/2022  Yvonne Kendall, MD Cardiology 11/27/2022   Allergies:  No Known Allergies Current Home Medications:   Current Outpatient Medications  Medication Instructions   amLODipine (NORVASC) 10 mg, Oral, Daily   aspirin EC 81 mg, Oral, Daily, Swallow whole.   Blood Glucose Monitoring Suppl (CONTOUR NEXT ONE) KIT 1 each, Does not apply, 2 times daily   glucose blood test strip USE DAILY TO CHECK BLOOD SUGAR.   Lancets (ONETOUCH ULTRASOFT) lancets USE DAILY TO CHECK BLOOD SUGAR.   metFORMIN (GLUCOPHAGE) 1,000 mg, Oral, 2 times daily with meals   metoprolol succinate (TOPROL-XL) 100 MG 24 hr tablet TAKE 1 TABLET DAILY, TAKE WITH OR IMMEDIATELY FOLLOWING A MEAL   Multiple Vitamins-Minerals (MULTIVITAMIN ADULT PO) 1 tablet, Oral, Daily   omega-3 acid ethyl esters (LOVAZA) 1 g capsule TAKE 2 CAPSULES TWICE A DAY   omeprazole (PRILOSEC) 20 mg, Oral, Every other day   rosuvastatin (CRESTOR) 40 mg, Oral, Daily   tamsulosin (FLOMAX) 0.8 mg, Oral, Daily    History:   Past Medical History:  Diagnosis Date   CAD (coronary artery disease) 11/13/2006   a.) LHC 11/13/2006: 90% mLAD, 70% D1, 60% D2, 70% pLCx, 100% pRCA, 95% RV marginal - refer to CVTS; b.) s/p 3v CABG 11/21/2006   Colon polyps 01/21/2009   DDD (degenerative disc disease), cervical    a.) s/p C4-C6 discectomy with interbody fusion and anterior plating 06/29/2001   Diverticula of colon 01/21/2009   Full dentures    GERD (gastroesophageal reflux disease)    Hyperlipidemia    Hypertension    Internal hemorrhoids 01/21/2009   Long-term use of aspirin  therapy    Personal history of tobacco use, presenting hazards to health 07/27/2015   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    S/P CABG x 3 11/21/2006   a.) LIMA-LAD, SVG-OM2, SVG-PDA   T2DM (type 2 diabetes mellitus) (HCC)    Past Surgical History:  Procedure  Laterality Date   CATARACT EXTRACTION Right 03/04/2022   CERVICAL FUSION N/A 06/29/2001   COLONOSCOPY     COLONOSCOPY N/A 09/14/2020   Procedure: COLONOSCOPY;  Surgeon: Midge Minium, MD;  Location: [facility] ENDOSCOPY;  Service: Endoscopy;  Laterality: N/A;   COLONOSCOPY WITH PROPOFOL N/A 07/24/2015   Procedure: COLONOSCOPY WITH PROPOFOL;  Surgeon: Midge Minium, MD;  Location: [facility];  Service: Endoscopy;  Laterality: N/A;   CORONARY ARTERY BYPASS GRAFT  11/21/2006   Procedure: CORONARY ARTERY BYPASS GRAFT; Location: [facility]; Surgeon: Georgia Dom, MD   LEFT HEART CATH AND CORONARY ANGIOGRAPHY Left 11/13/2006   Procedure: LEFT HEART CATH AND CORONARY ANGIOGRAPHY; Location: [facility]; Surgeon: Adrian Blackwater, MD   POLYPECTOMY  07/24/2015   Procedure: POLYPECTOMY;  Surgeon: Midge Minium, MD;  Location: [facility];  Service: Endoscopy;;   Family History  Problem Relation Age of Onset   Hypertension Mother    Heart disease Mother        CAD   Heart failure Mother    Cancer Father        colon, prostate   Stroke Father    Parkinson's disease Father    Social History   Tobacco Use   Smoking status: Former    Average packs/day: 1 pack/day for 40.0 years (40.0 ttl pk-yrs)    Types: Cigarettes, E-cigarettes    Start date: 1983    Passive exposure: Past   Smokeless tobacco: Never   Tobacco comments:    Currently vapes.  Substance Use Topics   Alcohol use: No   Pertinent Clinical Results:  LABS:   Abstract on 12/30/2022  Component Date Value Ref Range Status   HM Diabetic Eye Exam 03/05/2022 No Retinopathy  No Retinopathy Final  Office Visit on 12/30/2022  Component Date Value Ref Range Status   HB A1C (BAYER DCA - WAIVED) 12/30/2022 9.6 (H)  4.8 - 5.6 % Final   Comment:          Prediabetes: 5.7 - 6.4          Diabetes: >6.4          Glycemic control for adults with diabetes: <7.0   Appointment on 12/11/2022  Component Date Value Ref Range Status   AR max vel 12/11/2022  3.09  cm2 Final   AV Peak grad 12/11/2022 6.9  mmHg Final   Ao pk vel 12/11/2022 1.31  m/s Final   S' Lateral 12/11/2022 2.90  cm Final   Area-P 1/2 12/11/2022 3.31  cm2 Final   AV Area VTI 12/11/2022 2.79  cm2 Final   AV Mean grad 12/11/2022 4.0  mmHg Final   Single Plane A4C EF 12/11/2022 59.0  % Final   Single Plane A2C EF 12/11/2022 60.3  % Final   Calc EF 12/11/2022 59.1  % Final   AV Area mean vel 12/11/2022 2.91  cm2 Final   Est EF 12/11/2022 60 - 65%   Final    ECG: Date: 11/27/2022  Time ECG obtained: 1505 PM Rate: 72 bpm Rhythm:  Normal sinus rhythm; bifascicular block (RBBB + LPFB) Axis (leads I and aVF): left Intervals: PR 160 ms. QRS 132 ms. QTc 451  ms. ST segment and T wave changes: No evidence of acute T wave abnormalities or significant ST segment elevation or depression.  Comparison: Similar to previous tracing obtained on 09/12/2022   IMAGING / PROCEDURES: TRANSTHORACIC ECHOCARDIOGRAM performed on 12/11/2022 Left ventricular ejection fraction, by estimation, is 60 to 65%. The left ventricle has normal function. The left ventricle has no regional wall motion abnormalities. Left ventricular diastolic parameters are consistent with Grade I diastolic dysfunction (impaired relaxation).  Right ventricular systolic function is normal. The right ventricular size is normal. Tricuspid regurgitation signal is inadequate for assessing  PA pressure.   The mitral valve is normal in structure. Mild mitral valve regurgitation. No evidence of mitral stenosis.  Tricuspid valve regurgitation is mild to moderate.  The aortic valve has an indeterminant number of cusps. There is mild calcification of the aortic valve. Aortic valve regurgitation is not visualized. Aortic valve sclerosis/calcification is present, without any evidence of aortic stenosis. Aortic valve mean gradient measures 4.0 mmHg.  The inferior vena cava is normal in size with greater than 50% respiratory variability,  suggesting right atrial pressure of 3 mmHg.   US AORTA performed on 09/18/2022 Ectatic abdominal aorta, 2.6 cm. Five year follow up is recommended.   CT CHEST LUNG CA SCREEN LOW DOSE W/O CM performed on 03/14/2022 Lung-RADS 2, benign appearance or behavior. Continue annual screening with low-dose chest CT without contrast in 12 months. Gallstones Aortic atherosclerosis  Emphysema   Impression and Plan:  Maxwell Young has been referred for pre-anesthesia review and clearance prior to him undergoing the planned anesthetic and procedural courses. Available labs, pertinent testing, and imaging results were personally reviewed by me in preparation for upcoming operative/procedural course. [facility] medical record has been updated following extensive record review and patient interview with PAT staff.   This patient has been appropriately cleared by cardiology with an overall LOW to MODERATE risk of experiencing significant perioperative cardiovascular complications. Based on clinical review performed today (01/03/23), barring any significant acute changes in the patient's overall condition, it is anticipated that he will be able to proceed with the planned surgical intervention. Any acute changes in clinical condition may necessitate his procedure being postponed and/or cancelled. Patient will meet with anesthesia team (MD and/or CRNA) on the day of his procedure for preoperative evaluation/assessment. Questions regarding anesthetic course will be fielded at that time.   Pre-surgical instructions were reviewed with the patient during his PAT appointment, and questions were fielded to satisfaction by PAT clinical staff. He has been instructed on which medications that he will need to hold prior to surgery, as well as the ones that have been deemed safe/appropriate to take on the day of his procedure. As part of the general education provided by PAT, patient made aware both verbally and in writing,  that he would need to abstain from the use of any illegal substances during his perioperative course. He was advised that failure to follow the provided instructions could necessitate case cancellation or result in serious perioperative complications up to and including death. Patient encouraged to contact PAT and/or his surgeon's office to discuss any questions or concerns that may arise prior to surgery; verbalized understanding.   Quentin Mulling, MSN, APRN, FNP-C, CEN [facility]  Perioperative Services Nurse Practitioner Phone: [phone] Fax: [phone] 01/03/23 11:52 AM  NOTE: This note has been prepared using Dragon dictation software. Despite my best ability to proofread, there is always the potential that unintentional transcriptional errors may still  occur from this process.

## 2023-01-06 MED ORDER — ORAL CARE MOUTH RINSE: 15.0000 mL | Freq: Once | OROMUCOSAL | Status: AC

## 2023-01-06 MED ORDER — SODIUM CHLORIDE 0.9 % IV SOLN: INTRAVENOUS | Status: DC

## 2023-01-06 MED ORDER — ACETAMINOPHEN 500 MG PO TABS
1000.0000 mg | ORAL_TABLET | ORAL | Status: AC
  Administered 2023-01-07: 1000 mg via ORAL

## 2023-01-06 MED ORDER — CHLORHEXIDINE GLUCONATE 0.12 % MT SOLN
15.0000 mL | Freq: Once | OROMUCOSAL | Status: AC
  Administered 2023-01-07: 15 mL via OROMUCOSAL

## 2023-01-06 MED ORDER — CEFAZOLIN SODIUM-DEXTROSE 2-4 GM/100ML-% IV SOLN
2.0000 g | INTRAVENOUS | Status: AC
  Administered 2023-01-07: 2 g via INTRAVENOUS

## 2023-01-06 MED ORDER — CHLORHEXIDINE GLUCONATE CLOTH 2 % EX PADS: 6.0000 | MEDICATED_PAD | Freq: Once | CUTANEOUS | Status: DC

## 2023-01-06 MED ORDER — CELECOXIB 200 MG PO CAPS
200.0000 mg | ORAL_CAPSULE | ORAL | Status: AC
  Administered 2023-01-07: 200 mg via ORAL

## 2023-01-06 MED ORDER — GABAPENTIN 300 MG PO CAPS
300.0000 mg | ORAL_CAPSULE | ORAL | Status: AC
  Administered 2023-01-07: 300 mg via ORAL

## 2023-01-06 MED ORDER — CHLORHEXIDINE GLUCONATE CLOTH 2 % EX PADS
6.0000 | MEDICATED_PAD | Freq: Once | CUTANEOUS | Status: AC
  Administered 2023-01-07: 6 via TOPICAL

## 2023-01-06 NOTE — Progress Notes (Signed)
  Perioperative Services: Pre-Admission/Anesthesia Testing  Abnormal Lab Notification    Date: 01/06/23  Name: Maxwell Young MRN:   [id number]  Re: Abnormal labs noted during PAT appointment   Provider(s) Notified: Leafy Ro, MD Notification mode: Routed and/or faxed via [facility]   ABNORMAL LAB VALUE(S): Lab Results  Component Value Date   GLUCOSE 294 (H) 01/03/2023    Notes: Patient with a T2DM diagnosis. He is currently on oral monotherapy using metformin. Last Hgb A1c was 9.6% on 12/30/2022. In efforts to reduce the risk of developing SSI, or other potential perioperative complications, this communication is being sent in order to determine if patient is deemed to be adequately optimized  for surgery.   In light of the aforementioned A1c, his diabetes could pose increased risks for the patient during their perioperative course and subsequent recovery period. With that being said, the benefit of improving glycemic control must be weighed against the overall risks associated with delaying a necessary elective surgical procedure for this patient.   Maxwell Mulling, MSN, APRN, FNP-C, CEN [facility]  Perioperative Services Nurse Practitioner Phone: [phone] Fax: [phone] 01/06/23 8:15 AM

## 2023-01-07 ENCOUNTER — Ambulatory Visit: Payer: Medicare Other | Admitting: Urgent Care

## 2023-01-07 ENCOUNTER — Other Ambulatory Visit: Payer: Self-pay

## 2023-01-07 ENCOUNTER — Ambulatory Visit
Admission: RE | Admit: 2023-01-07 | Discharge: 2023-01-07 | Disposition: A | Discharge status: Home / Self Care | Payer: Medicare Other | Attending: Surgery | Admitting: Surgery

## 2023-01-07 ENCOUNTER — Encounter
Admission: RE | Disposition: A | Discharge status: Home / Self Care | Payer: Self-pay | Source: Home / Self Care | Attending: Surgery

## 2023-01-07 ENCOUNTER — Encounter: Payer: Self-pay | Admitting: Surgery

## 2023-01-07 DIAGNOSIS — Z7982 Long term (current) use of aspirin: Secondary | ICD-10-CM | POA: Diagnosis not present

## 2023-01-07 DIAGNOSIS — Z87891 Personal history of nicotine dependence: Secondary | ICD-10-CM | POA: Insufficient documentation

## 2023-01-07 DIAGNOSIS — J449 Chronic obstructive pulmonary disease, unspecified: Secondary | ICD-10-CM | POA: Diagnosis not present

## 2023-01-07 DIAGNOSIS — Z7984 Long term (current) use of oral hypoglycemic drugs: Secondary | ICD-10-CM | POA: Diagnosis not present

## 2023-01-07 DIAGNOSIS — E785 Hyperlipidemia, unspecified: Secondary | ICD-10-CM | POA: Diagnosis not present

## 2023-01-07 DIAGNOSIS — K219 Gastro-esophageal reflux disease without esophagitis: Secondary | ICD-10-CM | POA: Insufficient documentation

## 2023-01-07 DIAGNOSIS — Z955 Presence of coronary angioplasty implant and graft: Secondary | ICD-10-CM | POA: Insufficient documentation

## 2023-01-07 DIAGNOSIS — I2510 Atherosclerotic heart disease of native coronary artery without angina pectoris: Secondary | ICD-10-CM | POA: Insufficient documentation

## 2023-01-07 DIAGNOSIS — Z79899 Other long term (current) drug therapy: Secondary | ICD-10-CM | POA: Insufficient documentation

## 2023-01-07 DIAGNOSIS — K4020 Bilateral inguinal hernia, without obstruction or gangrene, not specified as recurrent: Secondary | ICD-10-CM | POA: Diagnosis not present

## 2023-01-07 DIAGNOSIS — E1169 Type 2 diabetes mellitus with other specified complication: Secondary | ICD-10-CM

## 2023-01-07 DIAGNOSIS — E1151 Type 2 diabetes mellitus with diabetic peripheral angiopathy without gangrene: Secondary | ICD-10-CM | POA: Diagnosis not present

## 2023-01-07 DIAGNOSIS — I10 Essential (primary) hypertension: Secondary | ICD-10-CM | POA: Insufficient documentation

## 2023-01-07 DIAGNOSIS — Z951 Presence of aortocoronary bypass graft: Secondary | ICD-10-CM | POA: Diagnosis not present

## 2023-01-07 HISTORY — PX: INSERTION OF MESH: SHX5868

## 2023-01-07 HISTORY — DX: Long term (current) use of aspirin: Z79.82

## 2023-01-07 HISTORY — DX: Atherosclerosis of aorta: I70.0

## 2023-01-07 HISTORY — DX: Presence of dental prosthetic device (complete) (partial): K08.109

## 2023-01-07 HISTORY — DX: Other cervical disc degeneration, unspecified cervical region: M50.30

## 2023-01-07 HISTORY — DX: Type 2 diabetes mellitus without complications: E11.9

## 2023-01-07 HISTORY — DX: Calculus of gallbladder without cholecystitis without obstruction: K80.20

## 2023-01-07 HISTORY — DX: Emphysema, unspecified: J43.9

## 2023-01-07 HISTORY — DX: Bilateral inguinal hernia, without obstruction or gangrene, not specified as recurrent: K40.20

## 2023-01-07 HISTORY — DX: Other ill-defined heart diseases: I51.89

## 2023-01-07 HISTORY — DX: Complete loss of teeth, unspecified cause, unspecified class: Z97.2

## 2023-01-07 HISTORY — DX: Bifascicular block: I45.2

## 2023-01-07 LAB — POCT I-STAT, CHEM 8
BUN: 10 mg/dL (ref 8–23)
Calcium, Ion: 1.13 mmol/L — ABNORMAL LOW (ref 1.15–1.40)
Chloride: 99 mmol/L (ref 98–111)
Creatinine, Ser: 1 mg/dL (ref 0.61–1.24)
Glucose, Bld: 371 mg/dL — ABNORMAL HIGH (ref 70–99)
HCT: 44 % (ref 39.0–52.0)
Hemoglobin: 15 g/dL (ref 13.0–17.0)
Potassium: 3.7 mmol/L (ref 3.5–5.1)
Sodium: 137 mmol/L (ref 135–145)
TCO2: 23 mmol/L (ref 22–32)

## 2023-01-07 LAB — GLUCOSE, CAPILLARY
Glucose-Capillary: 332 mg/dL — ABNORMAL HIGH (ref 70–99)
Glucose-Capillary: 320 mg/dL — ABNORMAL HIGH (ref 70–99)
Glucose-Capillary: 242 mg/dL — ABNORMAL HIGH (ref 70–99)
Glucose-Capillary: 285 mg/dL — ABNORMAL HIGH (ref 70–99)

## 2023-01-07 SURGERY — REPAIR, HERNIA, INGUINAL, BILATERAL, ROBOT-ASSISTED
Anesthesia: General | Laterality: Bilateral

## 2023-01-07 MED ORDER — EPHEDRINE 5 MG/ML INJ
INTRAVENOUS | Status: AC
Start: 2023-01-07 — End: ?
  Filled 2023-01-07: qty 5

## 2023-01-07 MED ORDER — KETAMINE HCL 50 MG/5ML IJ SOSY
PREFILLED_SYRINGE | INTRAMUSCULAR | Status: AC
  Filled 2023-01-07: qty 5

## 2023-01-07 MED ORDER — GABAPENTIN 300 MG PO CAPS
ORAL_CAPSULE | ORAL | Status: AC
  Filled 2023-01-07: qty 1

## 2023-01-07 MED ORDER — KETAMINE HCL 50 MG/5ML IJ SOSY
PREFILLED_SYRINGE | INTRAMUSCULAR | Status: DC | PRN
  Administered 2023-01-07: 40 mg via INTRAVENOUS

## 2023-01-07 MED ORDER — MIDAZOLAM HCL 2 MG/2ML IJ SOLN
INTRAMUSCULAR | Status: AC
  Filled 2023-01-07: qty 2

## 2023-01-07 MED ORDER — CHLORHEXIDINE GLUCONATE 0.12 % MT SOLN
OROMUCOSAL | Status: AC
  Filled 2023-01-07: qty 15

## 2023-01-07 MED ORDER — LIDOCAINE HCL (CARDIAC) PF 100 MG/5ML IV SOSY
PREFILLED_SYRINGE | INTRAVENOUS | Status: DC | PRN
  Administered 2023-01-07: 80 mg via INTRAVENOUS

## 2023-01-07 MED ORDER — BUPIVACAINE LIPOSOME 1.3 % IJ SUSP
INTRAMUSCULAR | Status: AC
  Filled 2023-01-07: qty 20

## 2023-01-07 MED ORDER — INSULIN ASPART 100 UNIT/ML IJ SOLN
5.0000 [IU] | Freq: Once | INTRAMUSCULAR | Status: AC
  Administered 2023-01-07: 5 [IU] via SUBCUTANEOUS

## 2023-01-07 MED ORDER — FENTANYL CITRATE (PF) 100 MCG/2ML IJ SOLN
INTRAMUSCULAR | Status: AC
  Filled 2023-01-07: qty 2

## 2023-01-07 MED ORDER — SUGAMMADEX SODIUM 200 MG/2ML IV SOLN
INTRAVENOUS | Status: DC | PRN
  Administered 2023-01-07: 200 mg via INTRAVENOUS

## 2023-01-07 MED ORDER — OXYCODONE HCL 5 MG PO TABS
5.0000 mg | ORAL_TABLET | Freq: Once | ORAL | Status: AC | PRN
  Administered 2023-01-07: 5 mg via ORAL

## 2023-01-07 MED ORDER — FENTANYL CITRATE (PF) 100 MCG/2ML IJ SOLN
INTRAMUSCULAR | Status: DC | PRN
  Administered 2023-01-07 (×2): 25 ug via INTRAVENOUS
  Administered 2023-01-07: 50 ug via INTRAVENOUS

## 2023-01-07 MED ORDER — BUPIVACAINE LIPOSOME 1.3 % IJ SUSP
INTRAMUSCULAR | Status: DC | PRN
  Administered 2023-01-07: 20 mL

## 2023-01-07 MED ORDER — BUPIVACAINE-EPINEPHRINE (PF) 0.25% -1:200000 IJ SOLN
INTRAMUSCULAR | Status: AC
  Filled 2023-01-07: qty 30

## 2023-01-07 MED ORDER — INSULIN ASPART 100 UNIT/ML IJ SOLN
INTRAMUSCULAR | Status: AC
  Filled 2023-01-07: qty 1

## 2023-01-07 MED ORDER — OXYCODONE HCL 5 MG PO TABS
ORAL_TABLET | ORAL | Status: AC
  Filled 2023-01-07: qty 1

## 2023-01-07 MED ORDER — ROCURONIUM BROMIDE 10 MG/ML (PF) SYRINGE
PREFILLED_SYRINGE | INTRAVENOUS | Status: AC
  Filled 2023-01-07: qty 10

## 2023-01-07 MED ORDER — MIDAZOLAM HCL 2 MG/2ML IJ SOLN
INTRAMUSCULAR | Status: DC | PRN
  Administered 2023-01-07: .5 mg via INTRAVENOUS

## 2023-01-07 MED ORDER — PROPOFOL 10 MG/ML IV BOLUS
INTRAVENOUS | Status: AC
  Filled 2023-01-07: qty 40

## 2023-01-07 MED ORDER — ROCURONIUM BROMIDE 100 MG/10ML IV SOLN
INTRAVENOUS | Status: DC | PRN
  Administered 2023-01-07: 20 mg via INTRAVENOUS
  Administered 2023-01-07: 50 mg via INTRAVENOUS

## 2023-01-07 MED ORDER — LIDOCAINE HCL (PF) 2 % IJ SOLN
INTRAMUSCULAR | Status: AC
Start: 2023-01-07 — End: ?
  Filled 2023-01-07: qty 5

## 2023-01-07 MED ORDER — CEFAZOLIN SODIUM-DEXTROSE 2-4 GM/100ML-% IV SOLN
INTRAVENOUS | Status: AC
  Filled 2023-01-07: qty 100

## 2023-01-07 MED ORDER — PHENYLEPHRINE 80 MCG/ML (10ML) SYRINGE FOR IV PUSH (FOR BLOOD PRESSURE SUPPORT)
PREFILLED_SYRINGE | INTRAVENOUS | Status: DC | PRN
  Administered 2023-01-07 (×4): 80 ug via INTRAVENOUS

## 2023-01-07 MED ORDER — PHENYLEPHRINE 80 MCG/ML (10ML) SYRINGE FOR IV PUSH (FOR BLOOD PRESSURE SUPPORT)
PREFILLED_SYRINGE | INTRAVENOUS | Status: AC
Start: 2023-01-07 — End: ?
  Filled 2023-01-07: qty 10

## 2023-01-07 MED ORDER — HYDROMORPHONE HCL 1 MG/ML IJ SOLN: 0.2500 mg | INTRAMUSCULAR | Status: DC | PRN

## 2023-01-07 MED ORDER — INSULIN ASPART 100 UNIT/ML IJ SOLN
10.0000 [IU] | Freq: Once | INTRAMUSCULAR | Status: AC
  Administered 2023-01-07: 10 [IU] via SUBCUTANEOUS

## 2023-01-07 MED ORDER — ONDANSETRON HCL 4 MG/2ML IJ SOLN
INTRAMUSCULAR | Status: DC | PRN
  Administered 2023-01-07: 4 mg via INTRAVENOUS

## 2023-01-07 MED ORDER — HYDROCODONE-ACETAMINOPHEN 5-325 MG PO TABS: 1.0000 | ORAL_TABLET | ORAL | 0 refills | Status: DC | PRN

## 2023-01-07 MED ORDER — ACETAMINOPHEN 500 MG PO TABS
ORAL_TABLET | ORAL | Status: AC
Start: 2023-01-07 — End: ?
  Filled 2023-01-07: qty 2

## 2023-01-07 MED ORDER — OXYCODONE HCL 5 MG/5ML PO SOLN: 5.0000 mg | Freq: Once | ORAL | Status: AC | PRN

## 2023-01-07 MED ORDER — EPHEDRINE SULFATE-NACL 50-0.9 MG/10ML-% IV SOSY
PREFILLED_SYRINGE | INTRAVENOUS | Status: DC | PRN
  Administered 2023-01-07: 5 mg via INTRAVENOUS

## 2023-01-07 MED ORDER — CELECOXIB 200 MG PO CAPS
ORAL_CAPSULE | ORAL | Status: AC
  Filled 2023-01-07: qty 1

## 2023-01-07 MED ORDER — PROPOFOL 10 MG/ML IV BOLUS
INTRAVENOUS | Status: DC | PRN
  Administered 2023-01-07: 120 mg via INTRAVENOUS

## 2023-01-07 MED ORDER — ONDANSETRON HCL 4 MG/2ML IJ SOLN
INTRAMUSCULAR | Status: AC
  Filled 2023-01-07: qty 2

## 2023-01-07 MED ORDER — BUPIVACAINE-EPINEPHRINE 0.25% -1:200000 IJ SOLN
INTRAMUSCULAR | Status: DC | PRN
  Administered 2023-01-07: 30 mL

## 2023-01-07 SURGICAL SUPPLY — 44 items
CANNULA REDUCER 12-8 DVNC XI (CANNULA) ×2 IMPLANT
COVER TIP SHEARS 8 DVNC (MISCELLANEOUS) ×2 IMPLANT
COVER WAND RF STERILE (DRAPES) ×2 IMPLANT
DERMABOND ADVANCED .7 DNX12 (GAUZE/BANDAGES/DRESSINGS) ×2 IMPLANT
DRAPE ARM DVNC X/XI (DISPOSABLE) ×6 IMPLANT
DRAPE COLUMN DVNC XI (DISPOSABLE) ×2 IMPLANT
ELECT REM PT RETURN 9FT ADLT (ELECTROSURGICAL) ×2
ELECTRODE REM PT RTRN 9FT ADLT (ELECTROSURGICAL) ×2 IMPLANT
FORCEPS BPLR R/ABLATION 8 DVNC (INSTRUMENTS) ×2 IMPLANT
FORCEPS PROGRASP DVNC XI (FORCEP) ×2 IMPLANT
GLOVE BIO SURGEON STRL SZ7 (GLOVE) ×8 IMPLANT
GOWN STRL REUS W/ TWL LRG LVL3 (GOWN DISPOSABLE) ×8 IMPLANT
IRRIGATION STRYKERFLOW (MISCELLANEOUS) ×2 IMPLANT
IRRIGATOR STRYKERFLOW (MISCELLANEOUS)
IV NS 1000ML BAXH (IV SOLUTION) IMPLANT
KIT PINK PAD W/HEAD ARE REST (MISCELLANEOUS) ×2
KIT PINK PAD W/HEAD ARM REST (MISCELLANEOUS) ×2 IMPLANT
LABEL OR SOLS (LABEL) ×2 IMPLANT
MANIFOLD NEPTUNE II (INSTRUMENTS) ×2 IMPLANT
MESH 3DMAX 4X6 LT LRG (Mesh General) IMPLANT
MESH 3DMAX 4X6 RT LRG (Mesh General) IMPLANT
NDL DRIVE SUT CUT DVNC (INSTRUMENTS) ×2 IMPLANT
NDL HYPO 22X1.5 SAFETY MO (MISCELLANEOUS) ×2 IMPLANT
NEEDLE DRIVE SUT CUT DVNC (INSTRUMENTS) ×2 IMPLANT
NEEDLE HYPO 22X1.5 SAFETY MO (MISCELLANEOUS) ×2 IMPLANT
OBTURATOR OPTICAL STND 8 DVNC (TROCAR) ×2
OBTURATOR OPTICALSTD 8 DVNC (TROCAR) ×2 IMPLANT
PACK LAP CHOLECYSTECTOMY (MISCELLANEOUS) ×2 IMPLANT
SCISSORS MNPLR CVD DVNC XI (INSTRUMENTS) ×2 IMPLANT
SEAL UNIV 5-12 XI (MISCELLANEOUS) ×4 IMPLANT
SET TUBE SMOKE EVAC HIGH FLOW (TUBING) ×2 IMPLANT
SOL ELECTROSURG ANTI STICK (MISCELLANEOUS) ×2
SOLUTION ELECTROSURG ANTI STCK (MISCELLANEOUS) ×2 IMPLANT
SPONGE T-LAP 18X18 ~~LOC~~+RFID (SPONGE) ×2 IMPLANT
SUT MNCRL AB 4-0 PS2 18 (SUTURE) ×2 IMPLANT
SUT STRATA 3-0 SH (SUTURE) ×4 IMPLANT
SUT V-LOC 90 ABS 3-0 VLT V-20 (SUTURE) IMPLANT
SUT VIC AB 2-0 SH 27XBRD (SUTURE) IMPLANT
SUT VICRYL 0 UR6 27IN ABS (SUTURE) ×4 IMPLANT
SYR 20ML LL LF (SYRINGE) IMPLANT
SYR 30ML LL (SYRINGE) ×2 IMPLANT
TAPE TRANSPORE STRL 2 31045 (GAUZE/BANDAGES/DRESSINGS) ×2 IMPLANT
TRAP FLUID SMOKE EVACUATOR (MISCELLANEOUS) ×2 IMPLANT
WATER STERILE IRR 500ML POUR (IV SOLUTION) ×2 IMPLANT

## 2023-01-07 NOTE — Telephone Encounter (Signed)
Based on our recent visit, Mr. Caine can proceed with his inguinal hernia repair, which is low to intermediate risk from a perioperative standpoint, without additional cardiovascular testing or intervention.  If possible, I recommend continuation of aspirin 81 mg daily during the perioperative period unless there is a high risk of bleeding complications.  Yvonne Kendall, MD [facility]

## 2023-01-07 NOTE — Anesthesia Procedure Notes (Signed)
Procedure Name: Intubation Date/Time: 01/07/2023 7:51 AM  Performed by: Elisabeth Pigeon, CRNAPre-anesthesia Checklist: Patient identified, Patient being monitored, Timeout performed, Emergency Drugs available and Suction available Patient Re-evaluated:Patient Re-evaluated prior to induction Oxygen Delivery Method: Circle system utilized Preoxygenation: Pre-oxygenation with 100% oxygen Induction Type: IV induction Ventilation: Mask ventilation without difficulty Laryngoscope Size: Mac, McGrath and 4 Grade View: Grade I Tube type: Oral Tube size: 7.5 mm Number of attempts: 1 Airway Equipment and Method: Stylet Placement Confirmation: ETT inserted through vocal cords under direct vision, positive ETCO2 and breath sounds checked- equal and bilateral Secured at: 22 cm Tube secured with: Tape Dental Injury: Teeth and Oropharynx as per pre-operative assessment

## 2023-01-07 NOTE — Anesthesia Preprocedure Evaluation (Signed)
Anesthesia Evaluation  Patient identified by MRN, date of birth, ID band Patient awake    Reviewed: Allergy & Precautions, NPO status , Patient's Chart, lab work & pertinent test results  History of Anesthesia Complications Negative for: history of anesthetic complications  Airway Mallampati: III  TM Distance: >3 FB Neck ROM: full    Dental  (+) Upper Dentures, Lower Dentures   Pulmonary COPD (denies inhaler use), Patient abstained from smoking., former smoker   Pulmonary exam normal        Cardiovascular hypertension, On Medications + CAD, + CABG and + Peripheral Vascular Disease  Normal cardiovascular exam+ dysrhythmias   Echo 11/2022 IMPRESSIONS     1. Left ventricular ejection fraction, by estimation, is 60 to 65%. The  left ventricle has normal function. The left ventricle has no regional  wall motion abnormalities. Left ventricular diastolic parameters are  consistent with Grade I diastolic  dysfunction (impaired relaxation).   2. Right ventricular systolic function is normal. The right ventricular  size is normal. Tricuspid regurgitation signal is inadequate for assessing  PA pressure.   3. The mitral valve is normal in structure. Mild mitral valve  regurgitation. No evidence of mitral stenosis.   4. Tricuspid valve regurgitation is mild to moderate.   5. The aortic valve has an indeterminant number of cusps. There is mild  calcification of the aortic valve. Aortic valve regurgitation is not  visualized. Aortic valve sclerosis/calcification is present, without any  evidence of aortic stenosis. Aortic  valve mean gradient measures 4.0 mmHg.   6. The inferior vena cava is normal in size with greater than 50%  respiratory variability, suggesting right atrial pressure of 3 mmHg.     Neuro/Psych negative neurological ROS  negative psych ROS   GI/Hepatic Neg liver ROS,GERD  Medicated,,  Endo/Other  negative  endocrine ROSdiabetes    Renal/GU      Musculoskeletal   Abdominal   Peds  Hematology negative hematology ROS (+)   Anesthesia Other Findings Past Medical History: 08/29/2022: Abdominal aortic ectasia (HCC)     Comment:  a.) US aorta 08/29/2022: 2.6 cm No date: Aortic atherosclerosis (HCC) No date: Bilateral inguinal hernia 11/13/2006: CAD (coronary artery disease)     Comment:  a.) LHC 11/13/2006: 90% mLAD, 70% D1, 60% D2, 70% pLCx,               100% pRCA, 95% RV marginal - refer to CVTS; b.) s/p 3v               CABG 11/21/2006 No date: Cholelithiasis 01/21/2009: Colon polyps No date: DDD (degenerative disc disease), cervical     Comment:  a.) s/p C4-C6 discectomy with interbody fusion and               anterior plating 06/29/2001 No date: Diastolic dysfunction     Comment:  a.) TTE 12/11/2022: EF 60-65%, no RWMAs, G1DD, norm               RVSF, mild MR, mild-mod TR, AoV sclerosis without               stenosis. 01/21/2009: Diverticula of colon No date: Full dentures No date: GERD (gastroesophageal reflux disease) No date: Hyperlipidemia No date: Hypertension 01/21/2009: Internal hemorrhoids No date: Long-term use of aspirin therapy 07/27/2015: Personal history of tobacco use, presenting hazards to  health No date: Pulmonary emphysema (HCC) No date: Right bundle branch block (RBBB) with left posterior  fascicular block 11/21/2006: S/P CABG x  3     Comment:  a.) LIMA-LAD, SVG-OM2, SVG-PDA No date: T2DM (type 2 diabetes mellitus) (HCC)  Past Surgical History: 03/04/2022: CATARACT EXTRACTION W/ INTRAOCULAR LENS IMPLANT; Right 06/29/2001: CERVICAL FUSION; N/A No date: COLONOSCOPY 09/14/2020: COLONOSCOPY; N/A     Comment:  Procedure: COLONOSCOPY;  Surgeon: Midge Minium, MD;                Location: [facility];  Service: Endoscopy;                Laterality: N/A; 07/24/2015: COLONOSCOPY WITH PROPOFOL; N/A     Comment:  Procedure: COLONOSCOPY WITH PROPOFOL;   Surgeon: Midge Minium, MD;  Location: [facility];  Service:               Endoscopy;  Laterality: N/A; 11/21/2006: CORONARY ARTERY BYPASS GRAFT     Comment:  Procedure: CORONARY ARTERY BYPASS GRAFT; Location: [facility];              Surgeon: Georgia Dom, MD 11/13/2006: LEFT HEART CATH AND CORONARY ANGIOGRAPHY; Left     Comment:  Procedure: LEFT HEART CATH AND CORONARY ANGIOGRAPHY;               Location: [facility]; Surgeon: Adrian Blackwater, MD 07/24/2015: POLYPECTOMY     Comment:  Procedure: POLYPECTOMY;  Surgeon: Midge Minium, MD;                Location: [facility];  Service: Endoscopy;;  BMI    Body Mass Index: 23.65 kg/m      Reproductive/Obstetrics negative OB ROS                             Anesthesia Physical Anesthesia Plan  ASA: 3  Anesthesia Plan: General ETT   Post-op Pain Management: Tylenol PO (pre-op)*, Gabapentin PO (pre-op)*, Celebrex PO (pre-op)* and Ketamine IV*   Induction: Intravenous  PONV Risk Score and Plan: 2 and Ondansetron, Dexamethasone, Midazolam and Treatment may vary due to age or medical condition  Airway Management Planned: Oral ETT  Additional Equipment:   Intra-op Plan:   Post-operative Plan: Extubation in OR  Informed Consent: I have reviewed the patients History and Physical, chart, labs and discussed the procedure including the risks, benefits and alternatives for the proposed anesthesia with the patient or authorized representative who has indicated his/her understanding and acceptance.     Dental Advisory Given  Plan Discussed with: Anesthesiologist, CRNA and Surgeon  Anesthesia Plan Comments: (Patient consented for risks of anesthesia including but not limited to:  - adverse reactions to medications - damage to eyes, teeth, lips or other oral mucosa - nerve damage due to positioning  - sore throat or hoarseness - Damage to heart, brain, nerves, lungs, other parts of body or loss of  life  Patient voiced understanding and assent.)        Anesthesia Quick Evaluation

## 2023-01-07 NOTE — Interval H&P Note (Signed)
History and Physical Interval Note:  01/07/2023 7:24 AM  Maxwell Young  has presented today for surgery, with the diagnosis of bilateral inguinal hernia.  The various methods of treatment have been discussed with the patient and family. After consideration of risks, benefits and other options for treatment, the patient has consented to  Procedure(s): XI ROBOTIC ASSISTED BILATERAL INGUINAL HERNIA (Bilateral) INSERTION OF MESH as a surgical intervention.  The patient's history has been reviewed, patient examined, no change in status, stable for surgery.  I have reviewed the patient's chart and labs.  Questions were answered to the patient's satisfaction.     Brynli Ollis F Eliyahu Bille

## 2023-01-07 NOTE — Anesthesia Postprocedure Evaluation (Signed)
Anesthesia Post Note  Patient: Corporate investment banker  Procedure(s) Performed: XI ROBOTIC ASSISTED BILATERAL INGUINAL HERNIA (Bilateral) INSERTION OF MESH  Patient location during evaluation: PACU Anesthesia Type: General Level of consciousness: awake and alert Pain management: pain level controlled Vital Signs Assessment: post-procedure vital signs reviewed and stable Respiratory status: spontaneous breathing, nonlabored ventilation, respiratory function stable and patient connected to nasal cannula oxygen Cardiovascular status: blood pressure returned to baseline and stable Postop Assessment: no apparent nausea or vomiting Anesthetic complications: no   No notable events documented.   Last Vitals:  Vitals:   01/07/23 1015 01/07/23 1028  BP: 123/75 124/76  Pulse: 76 75  Resp: 12 17  Temp:  (!) 36.1 C  SpO2: 96% 95%    Last Pain:  Vitals:   01/07/23 1015  TempSrc:   PainSc: 5                  Louie Boston

## 2023-01-07 NOTE — Transfer of Care (Signed)
Immediate Anesthesia Transfer of Care Note  Patient: Maxwell Young  Procedure(s) Performed: XI ROBOTIC ASSISTED BILATERAL INGUINAL HERNIA (Bilateral) INSERTION OF MESH  Patient Location: PACU  Anesthesia Type:General  Level of Consciousness: drowsy  Airway & Oxygen Therapy: Patient Spontanous Breathing and Patient connected to face mask oxygen  Post-op Assessment: Report given to RN and Post -op Vital signs reviewed and stable  Post vital signs: Reviewed and stable  Last Vitals:  Vitals Value Taken Time  BP 122/75 01/07/23 0935  Temp 36.5 C 01/07/23 0935  Pulse 70 01/07/23 0938  Resp 15 01/07/23 0938  SpO2 99 % 01/07/23 0938  Vitals shown include unfiled device data.  Last Pain:  Vitals:   01/07/23 0625  TempSrc: Temporal  PainSc:          Complications: No notable events documented.

## 2023-01-07 NOTE — Discharge Instructions (Addendum)
 Laparoscopic Inguinal Hernia Repair, Adult, Care After The following information offers guidance on how to care for yourself after your procedure. Your health care provider may also give you more specific instructions. If you have problems or questions, contact your health care provider. What can I expect after the procedure? After the procedure, it is common to have: Pain. Swelling and bruising around the incision area. Scrotal swelling, in males. Some fluid or blood draining from your incisions. Follow these instructions at home: Medicines Take over-the-counter and prescription medicines only as told by your health care provider. Ask your health care provider if the medicine prescribed to you: Requires you to avoid driving or using machinery. Can cause constipation. You may need to take these actions to prevent or treat constipation: Drink enough fluid to keep your urine pale yellow. Take over-the-counter or prescription medicines. Eat foods that are high in fiber, such as beans, whole grains, and fresh fruits and vegetables. Limit foods that are high in fat and processed sugars, such as fried or sweet foods. Incision care  Follow instructions from your health care provider about how to take care of your incisions. Make sure you: Wash your hands with soap and water for at least 20 seconds before and after you change your bandage (dressing). If soap and water are not available, use hand sanitizer. Change your dressing as told by your health care provider. Leave stitches (sutures), skin glue, or adhesive strips in place. These skin closures may need to stay in place for 2 weeks or longer. If adhesive strip edges start to loosen and curl up, you may trim the loose edges. Do not remove adhesive strips completely unless your health care provider tells you to do that. Check your incision area every day for signs of infection. Check for: More redness, swelling, or pain. More fluid or  blood. Warmth. Pus or a bad smell. Wear loose, soft clothing while your incisions heal. Managing pain and swelling  If directed, put ice on the painful or swollen areas. To do this: Put ice in a plastic bag. Place a towel between your skin and the bag. Leave the ice on for 20 minutes, 2-3 times a day. Remove the ice if your skin turns bright red. This is very important. If you cannot feel pain, heat, or cold, you have a greater risk of damage to the area.   Activity Do not lift anything that is heavier than 10 lb (4.5 kg), or the limit that you are told, until your health care provider says that it is safe. Ask your health care provider what activities are safe for you. A lot of activity during the first week after surgery can increase pain and swelling. For 1 week after your procedure: Avoid activities that take a lot of effort, such as exercise or sports. You may walk and climb stairs as needed for daily activity, but avoid long walks or climbing stairs for exercise. General instructions If you were given a sedative during the procedure, it can affect you for several hours. Do not drive or operate machinery until your health care provider says that it is safe. Do not take baths, swim, or use a hot tub until your health care provider approves. Ask your health care provider if you may take showers. You may only be allowed to take sponge baths. Do not use any products that contain nicotine or tobacco. These products include cigarettes, chewing tobacco, and vaping devices, such as e-cigarettes. If you need help quitting, ask  your health care provider. Keep all follow-up visits. This is important. Contact a health care provider if: You have any of these signs of infection: More redness, swelling, or pain around your incisions or your groin area. More fluid or blood coming from an incision. Warmth coming from an incision. Pus or a bad smell coming from an incision. A fever or chills. You  have more swelling in your scrotum, if you are male. You have severe pain and medicines do not help. You have abdominal pain or swelling. You cannot urinate or have a bowel movement. You faint or feel dizzy. You have nausea and vomiting. Get help right away if: You have redness, warmth, or pain in your leg. You have chest pain. You have problems breathing. These symptoms may represent a serious problem that is an emergency. Do not wait to see if the symptoms will go away. Get medical help right away. Call your local emergency services (911 in the [place] Do not drive yourself to the hospital. Summary Pain, swelling, and bruising are common after the procedure. Check your incision area every day for signs of infection, such as more redness, swelling, or pain. Put ice on painful or swollen areas for 20 minutes, 2-3 times a day. This information is not intended to replace advice given to you by your health care provider. Make sure you discuss any questions you have with your health care provider. Document Revised: 09/07/2019 Document Reviewed: 09/07/2019 Elsevier Patient Education  2024 [employer].

## 2023-01-07 NOTE — Telephone Encounter (Signed)
   Patient Name: Maxwell Young  DOB: 05/10/1955 MRN: [id number]  Primary Cardiologist: Yvonne Kendall, MD  Chart reviewed as part of pre-operative protocol coverage. Given past medical history and time since last visit, based on ACC/AHA guidelines, PIO STADTLANDER is at acceptable risk for the planned procedure without further cardiovascular testing.   Per Dr. Okey Dupre, primary cardiologist, "Based on our recent visit, Mr. Umbach can proceed with his inguinal hernia repair, which is low to intermediate risk from a perioperative standpoint, without additional cardiovascular testing or intervention.  If possible, I recommend continuation of aspirin 81 mg daily during the perioperative period unless there is a high risk of bleeding complications."   Yvonne Kendall, MD [facility]   I will route this recommendation to the requesting party via Epic fax function and remove from pre-op pool.  Please call with questions.  Joylene Grapes, NP 01/07/2023, 7:49 AM

## 2023-01-07 NOTE — Op Note (Signed)
Robotic assisted Laparoscopic Transabdominal Bilateral Inguinal Hernia Repair with 3 D large Mesh     Pre-operative Diagnosis:  bilateral Inguinal Hernias   Post-operative Diagnosis: Same   Procedure: Robotic  Laparoscopic  repair of Bilateral inguinal hernias   Surgeon: Sterling Big, MD FACS   Anesthesia: Gen. with endotracheal tube   Findings: Direct bilateral inguinal hernias        Procedure Details  The patient was seen again in the Holding Room. The benefits, complications, treatment options, and expected outcomes were discussed with the patient. The risks of bleeding, infection, recurrence of symptoms, failure to resolve symptoms, recurrence of hernia, ischemic orchitis, chronic pain syndrome or neuroma, were discussed again. The likelihood of improving the patient's symptoms with return to their baseline status is good.  The patient and/or family concurred with the proposed plan, giving informed consent.  The patient was taken to Operating Room, identified  and the procedure verified as Laparoscopic Inguinal Hernia Repair. Laterality confirmed.  A Time Out was held and the above information confirmed.   Prior to the induction of general anesthesia, antibiotic prophylaxis was administered. VTE prophylaxis was in place. General endotracheal anesthesia was then administered and tolerated well. After the induction, the abdomen was prepped with Chloraprep and draped in the sterile fashion. The patient was positioned in the supine position.   Supraumbilical incision created and cut down technique used to enter the abdominal cavity. Fascia elevated and incised and hasson trochar placed. Pneumoperitoneum obtained w/o HD changes. No evidence of bowel injuries.  Two 8 mm placed under direct vision. The laparoscopy revealed large direct defects bilaterally. I inserted the needles and the mesh. The robot was brought ot the table and docked in the standard fashion, no collision between arms was  observed. Instruments were kept under direct view at all times. We started on the right side were a flap was created. The sac was reduced and dissected free from adjacent structures. We preserved the vas and the vessels. Once dissection was completed a large 3D mesh was placed and secured with two interrupted vicryl attached to the pubic tubercle. There was good coverage of the direct, indirect and femoral spaces. The flap was closed with v lock suture.   Attention then was turned to the left side were a flap was created. The sac was reduced and dissected free from adjacent structures. We preserved the vas and the vessels. Once dissection was completed a large 3D mesh was placed and secured with two interrupted vicryl attached to the pubic tubercle. There was good coverage of the direct, indirect and femoral spaces. The flap was closed with v lock suture. Second look revealed no complications or injuries.   Once assuring that hemostasis was adequate the ports were removed and a figure-of-eight 0 Vicryl suture was placed at the fascial edges. 4-0 subcuticular Monocryl was used at all skin edges. Dermabond was placed.  Patient tolerated the procedure well. There were no complications. He was taken to the recovery room in stable condition.                 Sterling Big, MD, FACS

## 2023-01-08 ENCOUNTER — Encounter: Payer: Self-pay | Admitting: Surgery

## 2023-01-29 ENCOUNTER — Encounter: Payer: Self-pay | Admitting: Physician Assistant

## 2023-01-29 ENCOUNTER — Ambulatory Visit (INDEPENDENT_AMBULATORY_CARE_PROVIDER_SITE_OTHER): Payer: BLUE CROSS/BLUE SHIELD | Admitting: Physician Assistant

## 2023-01-29 VITALS — BP 121/75 | HR 75 | Temp 97.6°F | Ht 72.0 in | Wt 169.8 lb

## 2023-01-29 DIAGNOSIS — Z09 Encounter for follow-up examination after completed treatment for conditions other than malignant neoplasm: Secondary | ICD-10-CM

## 2023-01-29 DIAGNOSIS — K4020 Bilateral inguinal hernia, without obstruction or gangrene, not specified as recurrent: Secondary | ICD-10-CM

## 2023-01-29 NOTE — Progress Notes (Signed)
 [facility] POST-OP OFFICE VISIT  01/29/2023  HPI: Maxwell Young is a 68 y.o. male 22 days s/p robotic assisted laparoscopic bilateral inguinal hernia repair with Dr Jordis   He has done well Pain for about 4-5 days Now pain free  No fever, chills, nausea, emesis He has had occasional diarrhea since surgery Tolerating PO without issue Ambulating well Incisions are well healed   Vital signs: BP 121/75   Pulse 75   Temp 97.6 F (36.4 C) (Oral)   Ht 6' (1.829 m)   Wt 169 lb 12.8 oz (77 kg)   SpO2 97%   BMI 23.03 kg/m    Physical Exam: Constitutional: Well appearing male, NAD Abdomen: Soft, non-tender, non-distended, no rebound/guarding. Bilateral groins are soft, non-tender Skin: Laparoscopic incisions are healing well, no erythema or drainage   Assessment/Plan: This is a 68 y.o. male 22 days s/p robotic assisted laparoscopic bilateral inguinal hernia repair with Dr Jordis    - Pain control prn  - Reviewed wound care recommendation  - Reviewed lifting restrictions; 6 weeks total  - He can follow up on as needed basis; He understands to call with questions/concerns  -- Arthea Platt, PA-C [facility] 01/29/2023, 2:12 PM M-F: 7am - 4pm

## 2023-01-29 NOTE — Patient Instructions (Signed)

## 2023-02-20 ENCOUNTER — Encounter: Payer: Self-pay | Admitting: Cardiology

## 2023-02-20 ENCOUNTER — Ambulatory Visit: Payer: No Typology Code available for payment source | Attending: Cardiology | Admitting: Cardiology

## 2023-02-20 VITALS — BP 117/72 | HR 70 | Ht 72.0 in | Wt 169.2 lb

## 2023-02-20 DIAGNOSIS — Z951 Presence of aortocoronary bypass graft: Secondary | ICD-10-CM

## 2023-02-20 DIAGNOSIS — E785 Hyperlipidemia, unspecified: Secondary | ICD-10-CM | POA: Diagnosis not present

## 2023-02-20 DIAGNOSIS — E1169 Type 2 diabetes mellitus with other specified complication: Secondary | ICD-10-CM

## 2023-02-20 DIAGNOSIS — I2510 Atherosclerotic heart disease of native coronary artery without angina pectoris: Secondary | ICD-10-CM | POA: Diagnosis not present

## 2023-02-20 DIAGNOSIS — I10 Essential (primary) hypertension: Secondary | ICD-10-CM | POA: Diagnosis not present

## 2023-02-20 NOTE — Patient Instructions (Signed)
Medication Instructions:  Your physician recommends that you continue on your current medications as directed. Please refer to the Current Medication list given to you today.   *If you need a refill on your cardiac medications before your next appointment, please call your pharmacy*   Lab Work: No labs ordered today    Testing/Procedures: No test ordered today    Follow-Up: At [facility], you and your health needs are our priority.  As part of our continuing mission to provide you with exceptional heart care, we have created designated Provider Care Teams.  These Care Teams include your primary Cardiologist (physician) and Advanced Practice Providers (APPs -  Physician Assistants and Nurse Practitioners) who all work together to provide you with the care you need, when you need it.  We recommend signing up for the patient portal called "MyChart".  Sign up information is provided on this After Visit Summary.  MyChart is used to connect with patients for Virtual Visits (Telemedicine).  Patients are able to view lab/test results, encounter notes, upcoming appointments, etc.  Non-urgent messages can be sent to your provider as well.   To learn more about what you can do with MyChart, go to [email or web address].    Your next appointment:   6 month(s)  Provider:   You may see Yvonne Kendall, MD or one of the following Advanced Practice Providers on your designated Care Team:   Nicolasa Ducking, NP Eula Listen, PA-C Cadence Fransico Michael, PA-C Charlsie Quest, NP Carlos Levering, NP

## 2023-02-20 NOTE — Progress Notes (Signed)
Cardiology Office Note:  .   Date:  02/20/2023  ID:  Maxwell Young, DOB 08/26/1955, MRN [id number] PCP: Dorcas Carrow, DO   HeartCare Providers Cardiologist:  Yvonne Kendall, MD    History of Present Illness: .   Maxwell Young is a 68 y.o. male with a past medical history of coronary artery disease status post CABG (11/11/2006 at [facility]; HLD, SVG-OM, and SVG-PDA), hypertension, hyperlipidemia, type 2 diabetes, GERD, who is here today for follow-up of his coronary disease.   Patient stated that leading up to his CABG in 2008 he was often tired and did not feel normal.  His PCP ordered a stress test which ultimately led to cardiac catheterization and CABG.  Post CABG he follows with Dr. Lennette Bihari in [facility] cardiology but had not seen cardiology in several years.  He did not have any symptoms reminiscent of what he experienced before his CABG..  He had followed up with his primary care provider and was found to have an ongoing management of his CAD and there was concern for an abnormal EKG he was sent to follow-up with cardiology.   He was seen in clinic 11/27/2022 by Dr. And.  He had been denying chest pain, shortness of breath, lightheadedness or peripheral edema.  He occasionally noticed that he was having palpitations when he gets up in the night to the bathroom though it is typically very brief and without associated symptoms.  He also noted some soreness and fatigue to his legs when walking extended distances approximately greater than 1 mile.  They also reviewed prior ABIs he had completed in March 2019 that were normal bilaterally his TBI's were abnormal.  His aspirin was decreased to 325 mg daily to 81 mg daily and for his abnormal EKG he had bifascicular block with a right bundle branch block and a left posterior fascicular block without high-grade AV block being noted.  He was scheduled for an echocardiogram to evaluate underlying structural abnormalities.  He returns to clinic  today stating overall for the cardiac perspective he has been doing well.  He denies any chest pain, shortness of breath, peripheral edema lightheadedness or tenderness.  He recently underwent hernia surgery 01/07/2023 and has been progressing well since that time.  States that he has been compliant with his current medication regimen without any adverse events.  Denies any emergency department visits or hospitalizations since his surgery.  ROS: 10 point review of systems has been reviewed and considered negative except what is listed in the HPI  Studies Reviewed: .       2D echo 12/11/2022 1. Left ventricular ejection fraction, by estimation, is 60 to 65%. The  left ventricle has normal function. The left ventricle has no regional  wall motion abnormalities. Left ventricular diastolic parameters are  consistent with Grade I diastolic  dysfunction (impaired relaxation).   2. Right ventricular systolic function is normal. The right ventricular  size is normal. Tricuspid regurgitation signal is inadequate for assessing  PA pressure.   3. The mitral valve is normal in structure. Mild mitral valve  regurgitation. No evidence of mitral stenosis.   4. Tricuspid valve regurgitation is mild to moderate.   5. The aortic valve has an indeterminant number of cusps. There is mild  calcification of the aortic valve. Aortic valve regurgitation is not  visualized. Aortic valve sclerosis/calcification is present, without any  evidence of aortic stenosis. Aortic  valve mean gradient measures 4.0 mmHg.   6. The inferior vena  cava is normal in size with greater than 50%  respiratory variability, suggesting right atrial pressure of 3 mmHg.   Risk Assessment/Calculations:             Physical Exam:   VS:  BP 117/72   Pulse 70   Ht 6' (1.829 m)   Wt 169 lb 3.2 oz (76.7 kg)   SpO2 97%   BMI 22.95 kg/m    Wt Readings from Last 3 Encounters:  02/20/23 169 lb 3.2 oz (76.7 kg)  01/29/23 169 lb 12.8 oz  (77 kg)  01/07/23 174 lb 6.4 oz (79.1 kg)    GEN: Well nourished, well developed in no acute distress NECK: No JVD; No carotid bruits CARDIAC: RRR, no murmurs, rubs, gallops RESPIRATORY:  Clear to auscultation without rales, wheezing or rhonchi  ABDOMEN: Soft, non-tender, non-distended EXTREMITIES:  No edema; No deformity   ASSESSMENT AND PLAN: .   Coronary artery disease status post three-vessel CABG in 2008.  He denies any anginal or anginal equivalents.  Recent echocardiogram revealed LVEF of 60 to 65%, no RWMA, G1 DD, mild MR mild to moderate TR.  He is continued on aspirin 81 mg daily rosuvastatin 40 mg daily.  No further ischemic workup needed at this time.  Primary hypertension with blood pressure today 117/72.  He is continued on amlodipine 10 mg daily and Toprol-XL 100 mg daily.  Blood pressures have remained stable.  He has been encouraged to continue to monitor his pressures 1 to 2 hours postmedication administration at home as well.  Hyperlipidemia associated with type 2 diabetes with last LDL 33 08/2022 and his last hemoglobin A1c of 9.6 in December 2024.  He is continued on rosuvastatin 40 mg daily and Lovaza 1 g 2 capsules twice daily.  He diabetes management is maintained by his PCP where he is continued on metformin 1000 mg twice daily.        Dispo: Patient return to clinic to see me/APP in 6 months or sooner if needed for evaluation of symptoms  Signed, Brendalee Matthies, NP

## 2023-03-03 ENCOUNTER — Ambulatory Visit: Payer: Medicare Other | Admitting: Internal Medicine

## 2023-03-04 ENCOUNTER — Telehealth: Payer: Self-pay | Admitting: Family Medicine

## 2023-03-04 ENCOUNTER — Other Ambulatory Visit: Payer: Self-pay | Admitting: Family Medicine

## 2023-03-04 DIAGNOSIS — E7800 Pure hypercholesterolemia, unspecified: Secondary | ICD-10-CM

## 2023-03-04 DIAGNOSIS — E1169 Type 2 diabetes mellitus with other specified complication: Secondary | ICD-10-CM

## 2023-03-04 DIAGNOSIS — E1151 Type 2 diabetes mellitus with diabetic peripheral angiopathy without gangrene: Secondary | ICD-10-CM

## 2023-03-04 NOTE — Telephone Encounter (Signed)
Medication Refill -  Most Recent Primary Care Visit:  Provider: Olevia Perches P  Department: [id number]  Visit Type: OFFICE VISIT  Date: 12/30/2022  Medication: amLODipine (NORVASC) 10 MG tablet glucose blood test strip metFORMIN (GLUCOPHAGE) 500 MG tablet metoprolol succinate (TOPROL-XL) 100 MG 24 hr tablet omega-3 acid ethyl esters (LOVAZA) 1 g capsule omeprazole (PRILOSEC) 20 MG capsule  rosuvastatin (CRESTOR) 40 MG tablet tamsulosin (FLOMAX) 0.4 MG CAPS capsule  Has the patient contacted their pharmacy? Yes   Is this the correct pharmacy for this prescription? Yes   This is the patient's preferred pharmacy:   [id number] [place], [place] - [address] [place] [address] Phone: [phone] Fax: [phone]  Has the prescription been filled recently? Yes  Is the patient out of the medication? Yes  Has the patient been seen for an appointment in the last year OR does the patient have an upcoming appointment? Yes  Can we respond through MyChart? Yes  Agent: Please be advised that Rx refills may take up to 3 business days. We ask that you follow-up with your pharmacy.

## 2023-03-04 NOTE — Telephone Encounter (Signed)
Copied from CRM [id number]. Topic: General - Other >> Mar 04, 2023 12:47 PM Priscille Loveless wrote: Reason for CRM: Pt called about up coming CT scan and Prior auth. Pt states that he has a new insurance now and we may need to obtain prior auth before procedure.   [employer], Medicare [employer]vant[employer], member number [phone]. Please call if you need any additional information.

## 2023-03-04 NOTE — Telephone Encounter (Signed)
Maxwell Young can you check into this for the patient please?

## 2023-03-05 MED ORDER — TAMSULOSIN HCL 0.4 MG PO CAPS: 0.8000 mg | ORAL_CAPSULE | Freq: Every day | ORAL | 0 refills | Status: DC

## 2023-03-05 MED ORDER — AMLODIPINE BESYLATE 10 MG PO TABS
10.0000 mg | ORAL_TABLET | Freq: Every day | ORAL | 0 refills | Status: DC
Start: 2023-03-05 — End: 2023-03-31

## 2023-03-05 MED ORDER — OMEPRAZOLE 20 MG PO CPDR: 20.0000 mg | DELAYED_RELEASE_CAPSULE | ORAL | 1 refills | Status: DC

## 2023-03-05 MED ORDER — OMEGA-3-ACID ETHYL ESTERS 1 G PO CAPS: ORAL_CAPSULE | ORAL | 0 refills | Status: DC

## 2023-03-05 MED ORDER — METFORMIN HCL 500 MG PO TABS: 1000.0000 mg | ORAL_TABLET | Freq: Two times a day (BID) | ORAL | 0 refills | Status: DC

## 2023-03-05 MED ORDER — GLUCOSE BLOOD VI STRP: ORAL_STRIP | 0 refills | Status: DC

## 2023-03-05 MED ORDER — ROSUVASTATIN CALCIUM 40 MG PO TABS
40.0000 mg | ORAL_TABLET | Freq: Every day | ORAL | 0 refills | Status: DC
Start: 2023-03-05 — End: 2023-03-31

## 2023-03-05 NOTE — Telephone Encounter (Signed)
[facility].  Requested Prescriptions  Pending Prescriptions Disp Refills   amLODipine (NORVASC) 10 MG tablet 90 tablet 0    Sig: Take 1 tablet (10 mg total) by mouth daily.     Cardiovascular: Calcium Channel Blockers 2 Passed - 03/05/2023  1:39 PM      Passed - Last BP in normal range    BP Readings from Last 1 Encounters:  02/20/23 117/72         Passed - Last Heart Rate in normal range    Pulse Readings from Last 1 Encounters:  02/20/23 70         Passed - Valid encounter within last 6 months    Recent Outpatient Visits           2 months ago Type 2 diabetes mellitus with other specified complication, without long-term current use of insulin (HCC)   [place] [facility] [place], Megan P, DO   5 months ago [facility] [employer] preventive visit   [place] [facility] [place], [place], DO   8 months ago Aortic atherosclerosis [facility])   [place] [facility] [place], [place] P, DO   1 year ago Type 2 diabetes mellitus with other specified complication, without long-term current use of insulin (HCC)   [place] [facility] [place], Megan P, DO   1 year ago Routine general medical examination at a health care facility   [facility] [place], [place] P, DO       Future Appointments             In 3 weeks Laural Benes, Oralia Rud, DO [place] [facility]             glucose blood test strip 100 each 0    Sig: USE DAILY TO CHECK BLOOD SUGAR.     Endocrinology: Diabetes - Testing Supplies Passed - 03/05/2023  1:39 PM      Passed - Valid encounter within last 12 months    Recent Outpatient Visits           2 months ago Type 2 diabetes mellitus with other specified complication, without long-term current use of insulin (HCC)   [place] [facility] [place], Megan P, DO   5 months ago Welcome to [employer] preventive visit   [place] [place], Megan P, DO   8 months ago Aortic atherosclerosis [facility])   [place] [facility] [place], Megan P, DO   1 year ago Type 2 diabetes mellitus with other specified complication, without long-term current use of insulin (HCC)   [place] [facility] [place], Megan P, DO   1 year ago Routine general medical examination at a health care facility   [facility] [place], [place] P, DO       Future Appointments             In 3 weeks Laural Benes, Oralia Rud, DO [place] [facility]             metFORMIN (GLUCOPHAGE) 500 MG tablet 360 tablet 0    Sig: Take 2 tablets (1,000 mg total) by mouth 2 (two) times daily with a meal.     Endocrinology:  Diabetes - Biguanides Failed - 03/05/2023  1:39 PM      Failed - HBA1C is between 0 and 7.9 and within 180 days    HB A1C (  BAYER DCA - WAIVED)  Date Value Ref Range Status  12/30/2022 9.6 (H) 4.8 - 5.6 % Final    Comment:             Prediabetes: 5.7 - 6.4          Diabetes: >6.4          Glycemic control for adults with diabetes: <7.0          Failed - B12 Level in normal range and within 720 days    No results found for: "VITAMINB12"       Passed - Cr in normal range and within 360 days    Creatinine, Ser  Date Value Ref Range Status  01/07/2023 1.00 0.61 - 1.24 mg/dL Final         Passed - eGFR in normal range and within 360 days    GFR calc Af Amer  Date Value Ref Range Status  02/21/2020 89 >59 mL/min/1.73 Final    Comment:    **In accordance with recommendations from the NKF-ASN Task force,**   [facility] is in the process of updating its eGFR calculation to the   2021 CKD-EPI creatinine equation that estimates kidney function   without a race variable.    GFR, Estimated  Date Value Ref Range Status  01/03/2023 >60 >60 mL/min Final    Comment:    (NOTE) Calculated using the CKD-EPI Creatinine Equation (2021)    eGFR  Date Value Ref Range  Status  09/12/2022 80 >59 mL/min/1.73 Final         Passed - Valid encounter within last 6 months    Recent Outpatient Visits           2 months ago Type 2 diabetes mellitus with other specified complication, without long-term current use of insulin (HCC)   [place] [facility] [place], Megan P, DO   5 months ago Welcome to [employer] preventive visit   [place] [facility] [place], [place], DO   8 months ago Aortic atherosclerosis [facility])   [place] [facility] [place], Megan P, DO   1 year ago Type 2 diabetes mellitus with other specified complication, without long-term current use of insulin (HCC)   [facility] [place], Megan P, DO   1 year ago Routine general medical examination at a health care facility   [facility] [place], [place] P, DO       Future Appointments             In 3 weeks Laural Benes, Oralia Rud, DO [facility], [facility]            Passed - CBC within normal limits and completed in the last 12 months    WBC  Date Value Ref Range Status  01/03/2023 8.4 4.0 - 10.5 K/uL Final   RBC  Date Value Ref Range Status  01/03/2023 5.11 4.22 - 5.81 MIL/uL Final   Hemoglobin  Date Value Ref Range Status  01/07/2023 15.0 13.0 - 17.0 g/dL Final  40/98/1191 47.8 13.0 - 17.7 g/dL Final   HCT  Date Value Ref Range Status  01/07/2023 44.0 39.0 - 52.0 % Final   Hematocrit  Date Value Ref Range Status  09/12/2022 48.7 37.5 - 51.0 % Final   MCHC  Date Value Ref Range Status  01/03/2023 35.0 30.0 - 36.0 g/dL Final   [facility]  Date Value Ref Range Status  01/03/2023 31.1 26.0 -  34.0 pg Final   MCV  Date Value Ref Range Status  01/03/2023 88.8 80.0 - 100.0 fL Final  09/12/2022 95 79 - 97 fL Final   No results found for: "PLTCOUNTKUC", "LABPLAT", "POCPLA" RDW  Date Value Ref Range Status  01/03/2023 12.5 11.5 - 15.5 % Final  09/12/2022 13.0 11.6 - 15.4 %  Final          omega-3 acid ethyl esters (LOVAZA) 1 g capsule 360 capsule 0    Sig: TAKE 2 CAPSULES TWICE A DAY     Endocrinology:  Nutritional Agents - omega-3 acid ethyl esters Failed - 03/05/2023  1:39 PM      Failed - Lipid Panel in normal range within the last 12 months    Cholesterol, Total  Date Value Ref Range Status  09/12/2022 116 100 - 199 mg/dL Final   Cholesterol Piccolo, Waived  Date Value Ref Range Status  08/26/2017 165 <200 mg/dL Final    Comment:                            Desirable                <200                         Borderline High      200- 239                         High                     >239    LDL Chol Calc (NIH)  Date Value Ref Range Status  09/12/2022 33 0 - 99 mg/dL Final   HDL  Date Value Ref Range Status  09/12/2022 53 >39 mg/dL Final   Triglycerides  Date Value Ref Range Status  09/12/2022 187 (H) 0 - 149 mg/dL Final   Triglycerides Piccolo,Waived  Date Value Ref Range Status  08/26/2017 412 (H) <150 mg/dL Final    Comment:                            Normal                   <150                         Borderline High     150 - 199                         High                200 - 499                         Very High                >499          Passed - Valid encounter within last 12 months    Recent Outpatient Visits           2 months ago Type 2 diabetes mellitus with other specified complication, without long-term current use of insulin (HCC)   [place] [facility] [place], Megan P, DO   5 months ago Welcome to [employer]  preventive visit   [place] [facility] [place], [place] P, DO   8 months ago Aortic atherosclerosis [facility])   [place] [facility] [place], [place] P, DO   1 year ago Type 2 diabetes mellitus with other specified complication, without long-term current use of insulin (HCC)   [place] [facility] [place], Megan P, DO   1 year ago Routine  general medical examination at a health care facility   [facility] [place], [place] P, DO       Future Appointments             In 3 weeks Laural Benes, Oralia Rud, DO [place] [facility]             omeprazole (PRILOSEC) 20 MG capsule 15 capsule     Sig: Take 1 capsule (20 mg total) by mouth every other day.     Gastroenterology: Proton Pump Inhibitors Passed - 03/05/2023  1:39 PM      Passed - Valid encounter within last 12 months    Recent Outpatient Visits           2 months ago Type 2 diabetes mellitus with other specified complication, without long-term current use of insulin (HCC)   [place] [facility] [place], Megan P, DO   5 months ago Welcome to [employer] preventive visit   [place] [facility] [place], Megan P, DO   8 months ago Aortic atherosclerosis [facility])   [place] [facility] [place], Megan P, DO   1 year ago Type 2 diabetes mellitus with other specified complication, without long-term current use of insulin (HCC)   [place] [facility] [place], Megan P, DO   1 year ago Routine general medical examination at a health care facility   [facility] [place], [place] P, DO       Future Appointments             In 3 weeks Laural Benes, Oralia Rud, DO  [facility]             rosuvastatin (CRESTOR) 40 MG tablet 90 tablet 0    Sig: Take 1 tablet (40 mg total) by mouth daily.     Cardiovascular:  Antilipid - Statins 2 Failed - 03/05/2023  1:39 PM      Failed - Lipid Panel in normal range within the last 12 months    Cholesterol, Total  Date Value Ref Range Status  09/12/2022 116 100 - 199 mg/dL Final   Cholesterol Piccolo, Waived  Date Value Ref Range Status  08/26/2017 165 <200 mg/dL Final    Comment:                            Desirable                <200                         Borderline High      200-  239                         High                     >239    LDL Chol Calc (NIH)  Date Value Ref Range  Status  09/12/2022 33 0 - 99 mg/dL Final   HDL  Date Value Ref Range Status  09/12/2022 53 >39 mg/dL Final   Triglycerides  Date Value Ref Range Status  09/12/2022 187 (H) 0 - 149 mg/dL Final   Triglycerides Piccolo,Waived  Date Value Ref Range Status  08/26/2017 412 (H) <150 mg/dL Final    Comment:                            Normal                   <150                         Borderline High     150 - 199                         High                200 - 499                         Very High                >499          Passed - Cr in normal range and within 360 days    Creatinine, Ser  Date Value Ref Range Status  01/07/2023 1.00 0.61 - 1.24 mg/dL Final         Passed - Patient is not pregnant      Passed - Valid encounter within last 12 months    Recent Outpatient Visits           2 months ago Type 2 diabetes mellitus with other specified complication, without long-term current use of insulin (HCC)   [place] [facility] [place], Megan P, DO   5 months ago Welcome to [employer] preventive visit   [place] [facility] [place], [place], DO   8 months ago Aortic atherosclerosis [facility])   [place] [facility] [place], Megan P, DO   1 year ago Type 2 diabetes mellitus with other specified complication, without long-term current use of insulin (HCC)   [place] [facility] [place] [facility] [place], Megan P, DO   1 year ago Routine general medical examination at a health care facility   [facility] [place], [place] P, DO       Future Appointments             In 3 weeks Laural Benes, Oralia Rud, DO  [facility], [facility]             tamsulosin (FLOMAX) 0.4 MG CAPS capsule 180 capsule 0    Sig: Take 2 capsules (0.8 mg total) by mouth daily.     Urology: Alpha-Adrenergic Blocker  Passed - 03/05/2023  1:39 PM      Passed - PSA in normal range and within 360 days    Prostate Specific Ag, Serum  Date Value Ref Range Status  09/12/2022 3.4 0.0 - 4.0 ng/mL Final    Comment:    Roche ECLIA methodology. According to the American Urological Association, Serum PSA should decrease and remain at undetectable levels after radical prostatectomy. The AUA defines biochemical recurrence as an initial PSA value 0.2 ng/mL or greater followed by a subsequent confirmatory PSA value  0.2 ng/mL or greater. Values obtained with different assay methods or kits cannot be used interchangeably. Results cannot be interpreted as absolute evidence of the presence or absence of malignant disease.          Passed - Last BP in normal range    BP Readings from Last 1 Encounters:  02/20/23 117/72         Passed - Valid encounter within last 12 months    Recent Outpatient Visits           2 months ago Type 2 diabetes mellitus with other specified complication, without long-term current use of insulin (HCC)   [place] [facility] [place], Megan P, DO   5 months ago Welcome to [employer] preventive visit   [place] [facility] [place], Megan P, DO   8 months ago Aortic atherosclerosis [facility])   [place] [facility] [place], Megan P, DO   1 year ago Type 2 diabetes mellitus with other specified complication, without long-term current use of insulin (HCC)   [place] [facility] [place], Megan P, DO   1 year ago Routine general medical examination at a health care facility   [facility] Dorcas Carrow, DO       Future Appointments             In 3 weeks Dorcas Carrow, DO [place] [facility]

## 2023-03-05 NOTE — Telephone Encounter (Signed)
Requested medication (s) are due for refill today: routing for review  Requested medication (s) are on the active medication list: yes  Last refill:  08/20/19  Future visit scheduled: yes  Notes to clinic:  Unable to refill per protocol, last refill by another provider.      Requested Prescriptions  Pending Prescriptions Disp Refills   omeprazole (PRILOSEC) 20 MG capsule 15 capsule     Sig: Take 1 capsule (20 mg total) by mouth every other day.     Gastroenterology: Proton Pump Inhibitors Passed - 03/05/2023  1:40 PM      Passed - Valid encounter within last 12 months    Recent Outpatient Visits           2 months ago Type 2 diabetes mellitus with other specified complication, without long-term current use of insulin (HCC)   [place] [facility] [place], Megan P, DO   5 months ago Welcome to [employer] preventive visit   [place] [facility] [place], Megan P, DO   8 months ago Aortic atherosclerosis [facility])   [place] [place], Megan P, DO   1 year ago Type 2 diabetes mellitus with other specified complication, without long-term current use of insulin (HCC)   [place] [facility] [place], Megan P, DO   1 year ago Routine general medical examination at a health care facility   [facility] [place], [place] P, DO       Future Appointments             In 3 weeks Dorcas Carrow, DO [place] [facility]            Signed Prescriptions Disp Refills   amLODipine (NORVASC) 10 MG tablet 90 tablet 0    Sig: Take 1 tablet (10 mg total) by mouth daily.     Cardiovascular: Calcium Channel Blockers 2 Passed - 03/05/2023  1:40 PM      Passed - Last BP in normal range    BP Readings from Last 1 Encounters:  02/20/23 117/72         Passed - Last Heart Rate in normal range    Pulse Readings from Last 1 Encounters:  02/20/23 70         Passed - Valid encounter  within last 6 months    Recent Outpatient Visits           2 months ago Type 2 diabetes mellitus with other specified complication, without long-term current use of insulin (HCC)   [place] [facility] [place], Megan P, DO   5 months ago Welcome to [employer] preventive visit   [place] [facility] [place], [place], DO   8 months ago Aortic atherosclerosis [facility])   [place] [facility] [place], [place] P, DO   1 year ago Type 2 diabetes mellitus with other specified complication, without long-term current use of insulin (HCC)   [place] [facility] [place], Megan P, DO   1 year ago Routine general medical examination at a health care facility   [facility] [place], [place] P, DO       Future Appointments             In 3 weeks Dorcas Carrow, DO [facility]             glucose blood test strip 100 each 0  Sig: USE DAILY TO CHECK BLOOD SUGAR.     Endocrinology: Diabetes - Testing Supplies Passed - 03/05/2023  1:40 PM      Passed - Valid encounter within last 12 months    Recent Outpatient Visits           2 months ago Type 2 diabetes mellitus with other specified complication, without long-term current use of insulin (HCC)   [place] [facility] [place], Megan P, DO   5 months ago Welcome to [employer] preventive visit   [place] [facility] [place], Megan P, DO   8 months ago Aortic atherosclerosis (HCC)   [place] [facility] [place], Megan P, DO   1 year ago Type 2 diabetes mellitus with other specified complication, without long-term current use of insulin (HCC)   [place] [facility] [place], Megan P, DO   1 year ago Routine general medical examination at a health care facility   [facility] [place], [place] P, DO       Future Appointments             In 3 weeks  Laural Benes, Oralia Rud, DO [place] [facility]             metFORMIN (GLUCOPHAGE) 500 MG tablet 360 tablet 0    Sig: Take 2 tablets (1,000 mg total) by mouth 2 (two) times daily with a meal.     Endocrinology:  Diabetes - Biguanides Failed - 03/05/2023  1:40 PM      Failed - HBA1C is between 0 and 7.9 and within 180 days    HB A1C (BAYER DCA - WAIVED)  Date Value Ref Range Status  12/30/2022 9.6 (H) 4.8 - 5.6 % Final    Comment:             Prediabetes: 5.7 - 6.4          Diabetes: >6.4          Glycemic control for adults with diabetes: <7.0          Failed - B12 Level in normal range and within 720 days    No results found for: "VITAMINB12"       Passed - Cr in normal range and within 360 days    Creatinine, Ser  Date Value Ref Range Status  01/07/2023 1.00 0.61 - 1.24 mg/dL Final         Passed - eGFR in normal range and within 360 days    GFR calc Af Amer  Date Value Ref Range Status  02/21/2020 89 >59 mL/min/1.73 Final    Comment:    **In accordance with recommendations from the NKF-ASN Task force,**   [facility] is in the process of updating its eGFR calculation to the   2021 CKD-EPI creatinine equation that estimates kidney function   without a race variable.    GFR, Estimated  Date Value Ref Range Status  01/03/2023 >60 >60 mL/min Final    Comment:    (NOTE) Calculated using the CKD-EPI Creatinine Equation (2021)    eGFR  Date Value Ref Range Status  09/12/2022 80 >59 mL/min/1.73 Final         Passed - Valid encounter within last 6 months    Recent Outpatient Visits           2 months ago Type 2 diabetes mellitus with other specified complication, without long-term current use of insulin (HCC)   [place]  [facility] [place], Megan P, DO   5 months ago Welcome to [employer] preventive visit   [place] [facility] [place], [place], DO   8 months ago Aortic atherosclerosis [facility])   [place] [facility] [place], [place] P, DO   1 year ago Type 2 diabetes mellitus with other specified complication, without long-term current use of insulin (HCC)    [facility] [place], Megan P, DO   1 year ago Routine general medical examination at a health care facility   [facility] [place], Oralia Rud, DO       Future Appointments             In 3 weeks Dorcas Carrow, DO  [facility]            Passed - CBC within normal limits and completed in the last 12 months    WBC  Date Value Ref Range Status  01/03/2023 8.4 4.0 - 10.5 K/uL Final   RBC  Date Value Ref Range Status  01/03/2023 5.11 4.22 - 5.81 MIL/uL Final   Hemoglobin  Date Value Ref Range Status  01/07/2023 15.0 13.0 - 17.0 g/dL Final  29/56/2130 86.5 13.0 - 17.7 g/dL Final   HCT  Date Value Ref Range Status  01/07/2023 44.0 39.0 - 52.0 % Final   Hematocrit  Date Value Ref Range Status  09/12/2022 48.7 37.5 - 51.0 % Final   MCHC  Date Value Ref Range Status  01/03/2023 35.0 30.0 - 36.0 g/dL Final   [facility]  Date Value Ref Range Status  01/03/2023 31.1 26.0 - 34.0 pg Final   MCV  Date Value Ref Range Status  01/03/2023 88.8 80.0 - 100.0 fL Final  09/12/2022 95 79 - 97 fL Final   No results found for: "PLTCOUNTKUC", "LABPLAT", "POCPLA" RDW  Date Value Ref Range Status  01/03/2023 12.5 11.5 - 15.5 % Final  09/12/2022 13.0 11.6 - 15.4 % Final          omega-3 acid ethyl esters (LOVAZA) 1 g capsule 360 capsule 0    Sig: TAKE 2 CAPSULES TWICE A DAY     Endocrinology:  Nutritional Agents - omega-3 acid ethyl esters Failed - 03/05/2023  1:40 PM      Failed - Lipid Panel in normal range within the last 12 months    Cholesterol, Total  Date Value Ref Range Status  09/12/2022 116 100 - 199 mg/dL Final   Cholesterol Piccolo, Waived  Date Value Ref Range Status  08/26/2017 165 <200 mg/dL Final    Comment:                             Desirable                <200                         Borderline High      200- 239                         High                     >239    LDL Chol Calc (NIH)  Date Value Ref Range Status  09/12/2022 33 0 - 99 mg/dL Final   HDL  Date Value Ref Range Status  09/12/2022 53 >39 mg/dL Final   Triglycerides  Date Value Ref Range Status  09/12/2022 187 (H) 0 - 149 mg/dL Final   Triglycerides Piccolo,Waived  Date Value Ref Range Status  08/26/2017 412 (H) <150 mg/dL Final    Comment:                            Normal                   <150                         Borderline High     150 - 199                         High                200 - 499                         Very High                >499          Passed - Valid encounter within last 12 months    Recent Outpatient Visits           2 months ago Type 2 diabetes mellitus with other specified complication, without long-term current use of insulin (HCC)   [place] [facility] [place], Megan P, DO   5 months ago Welcome to [employer] preventive visit   [place] [facility] [place], [place], DO   8 months ago Aortic atherosclerosis [facility])   [place] [facility] [place], Megan P, DO   1 year ago Type 2 diabetes mellitus with other specified complication, without long-term current use of insulin (HCC)   [place] [facility] [place], Megan P, DO   1 year ago Routine general medical examination at a health care facility   [facility] [place], [place] P, DO       Future Appointments             In 3 weeks Laural Benes, Oralia Rud, DO [place] [facility]             rosuvastatin (CRESTOR) 40 MG tablet 90 tablet 0    Sig: Take 1 tablet (40 mg total) by mouth daily.     Cardiovascular:  Antilipid - Statins 2 Failed - 03/05/2023  1:40 PM      Failed - Lipid Panel in normal range within the last 12 months     Cholesterol, Total  Date Value Ref Range Status  09/12/2022 116 100 - 199 mg/dL Final   Cholesterol Piccolo, Waived  Date Value Ref Range Status  08/26/2017 165 <200 mg/dL Final    Comment:                            Desirable                <200                         Borderline High      200- 239  High                     >239    LDL Chol Calc (NIH)  Date Value Ref Range Status  09/12/2022 33 0 - 99 mg/dL Final   HDL  Date Value Ref Range Status  09/12/2022 53 >39 mg/dL Final   Triglycerides  Date Value Ref Range Status  09/12/2022 187 (H) 0 - 149 mg/dL Final   Triglycerides Piccolo,Waived  Date Value Ref Range Status  08/26/2017 412 (H) <150 mg/dL Final    Comment:                            Normal                   <150                         Borderline High     150 - 199                         High                200 - 499                         Very High                >499          Passed - Cr in normal range and within 360 days    Creatinine, Ser  Date Value Ref Range Status  01/07/2023 1.00 0.61 - 1.24 mg/dL Final         Passed - Patient is not pregnant      Passed - Valid encounter within last 12 months    Recent Outpatient Visits           2 months ago Type 2 diabetes mellitus with other specified complication, without long-term current use of insulin (HCC)   [place] [facility] [place], Megan P, DO   5 months ago Welcome to [employer] preventive visit   [place] [place], [place], DO   8 months ago Aortic atherosclerosis [facility])   [place] [facility] [place], Megan P, DO   1 year ago Type 2 diabetes mellitus with other specified complication, without long-term current use of insulin (HCC)   [place] [facility] [place], Megan P, DO   1 year ago Routine general medical examination at a health care facility   [facility]  [place], [place] P, DO       Future Appointments             In 3 weeks Laural Benes, Oralia Rud, DO [facility]             tamsulosin (FLOMAX) 0.4 MG CAPS capsule 180 capsule 0    Sig: Take 2 capsules (0.8 mg total) by mouth daily.     Urology: Alpha-Adrenergic Blocker Passed - 03/05/2023  1:40 PM      Passed - PSA in normal range and within 360 days    Prostate Specific Ag, Serum  Date Value Ref Range Status  09/12/2022 3.4 0.0 - 4.0 ng/mL Final    Comment:    Roche ECLIA methodology. According to the American Urological  Association, Serum PSA should decrease and remain at undetectable levels after radical prostatectomy. The AUA defines biochemical recurrence as an initial PSA value 0.2 ng/mL or greater followed by a subsequent confirmatory PSA value 0.2 ng/mL or greater. Values obtained with different assay methods or kits cannot be used interchangeably. Results cannot be interpreted as absolute evidence of the presence or absence of malignant disease.          Passed - Last BP in normal range    BP Readings from Last 1 Encounters:  02/20/23 117/72         Passed - Valid encounter within last 12 months    Recent Outpatient Visits           2 months ago Type 2 diabetes mellitus with other specified complication, without long-term current use of insulin (HCC)   [place] [facility] [place], Megan P, DO   5 months ago Welcome to [employer] preventive visit   [place] [facility] [place], Megan P, DO   8 months ago Aortic atherosclerosis [facility])   [place] [facility] [place], Megan P, DO   1 year ago Type 2 diabetes mellitus with other specified complication, without long-term current use of insulin (HCC)   [place] [facility] [place], Megan P, DO   1 year ago Routine general medical examination at a health care facility   [facility] Dorcas Carrow, DO        Future Appointments             In 3 weeks Dorcas Carrow, DO [place] [facility]

## 2023-03-06 NOTE — Telephone Encounter (Signed)
In process, awaiting response.

## 2023-03-07 NOTE — Telephone Encounter (Signed)
Returned call. No VM setup. When patient returns call, please advise PA is not needed for scan.

## 2023-03-17 ENCOUNTER — Ambulatory Visit
Admission: RE | Admit: 2023-03-17 | Discharge: 2023-03-17 | Disposition: A | Discharge status: Home / Self Care | Payer: No Typology Code available for payment source | Source: Ambulatory Visit | Attending: Acute Care | Admitting: Acute Care

## 2023-03-17 DIAGNOSIS — Z122 Encounter for screening for malignant neoplasm of respiratory organs: Secondary | ICD-10-CM | POA: Insufficient documentation

## 2023-03-17 DIAGNOSIS — Z87891 Personal history of nicotine dependence: Secondary | ICD-10-CM | POA: Insufficient documentation

## 2023-03-31 ENCOUNTER — Ambulatory Visit (INDEPENDENT_AMBULATORY_CARE_PROVIDER_SITE_OTHER): Payer: Medicare Other | Admitting: Family Medicine

## 2023-03-31 ENCOUNTER — Encounter: Payer: Self-pay | Admitting: Family Medicine

## 2023-03-31 VITALS — BP 100/64 | HR 65 | Temp 98.4°F | Resp 17 | Ht 72.01 in | Wt 170.2 lb

## 2023-03-31 DIAGNOSIS — I2510 Atherosclerotic heart disease of native coronary artery without angina pectoris: Secondary | ICD-10-CM | POA: Diagnosis not present

## 2023-03-31 DIAGNOSIS — E1169 Type 2 diabetes mellitus with other specified complication: Secondary | ICD-10-CM

## 2023-03-31 DIAGNOSIS — K219 Gastro-esophageal reflux disease without esophagitis: Secondary | ICD-10-CM

## 2023-03-31 DIAGNOSIS — I10 Essential (primary) hypertension: Secondary | ICD-10-CM

## 2023-03-31 DIAGNOSIS — J439 Emphysema, unspecified: Secondary | ICD-10-CM

## 2023-03-31 DIAGNOSIS — E785 Hyperlipidemia, unspecified: Secondary | ICD-10-CM

## 2023-03-31 DIAGNOSIS — I700 Atherosclerosis of aorta: Secondary | ICD-10-CM

## 2023-03-31 DIAGNOSIS — R972 Elevated prostate specific antigen [PSA]: Secondary | ICD-10-CM

## 2023-03-31 DIAGNOSIS — Z7984 Long term (current) use of oral hypoglycemic drugs: Secondary | ICD-10-CM

## 2023-03-31 MED ORDER — METFORMIN HCL 500 MG PO TABS: 1000.0000 mg | ORAL_TABLET | Freq: Two times a day (BID) | ORAL | 1 refills | Status: DC

## 2023-03-31 MED ORDER — OMEGA-3-ACID ETHYL ESTERS 1 G PO CAPS: ORAL_CAPSULE | ORAL | 1 refills | Status: DC

## 2023-03-31 MED ORDER — METOPROLOL SUCCINATE ER 100 MG PO TB24: 100.0000 mg | ORAL_TABLET | Freq: Every day | ORAL | 1 refills | Status: DC

## 2023-03-31 MED ORDER — ONETOUCH ULTRASOFT LANCETS MISC: 12 refills | Status: DC

## 2023-03-31 MED ORDER — OMEPRAZOLE 20 MG PO CPDR: 20.0000 mg | DELAYED_RELEASE_CAPSULE | ORAL | 1 refills | Status: DC

## 2023-03-31 MED ORDER — ROSUVASTATIN CALCIUM 40 MG PO TABS: 40.0000 mg | ORAL_TABLET | Freq: Every day | ORAL | 0 refills | Status: DC

## 2023-03-31 MED ORDER — TAMSULOSIN HCL 0.4 MG PO CAPS: 0.8000 mg | ORAL_CAPSULE | Freq: Every day | ORAL | 1 refills | Status: DC

## 2023-03-31 MED ORDER — GLUCOSE BLOOD VI STRP: ORAL_STRIP | 0 refills | Status: DC

## 2023-03-31 MED ORDER — AMLODIPINE BESYLATE 10 MG PO TABS: 10.0000 mg | ORAL_TABLET | Freq: Every day | ORAL | 0 refills | Status: DC

## 2023-03-31 MED ORDER — CONTOUR NEXT ONE KIT: 1.0000 | PACK | Freq: Two times a day (BID) | 12 refills | Status: DC

## 2023-03-31 NOTE — Assessment & Plan Note (Signed)
 Rechecking labs today. Await results. Treat as needed.

## 2023-03-31 NOTE — Progress Notes (Signed)
 BP 100/64 (BP Location: Left Arm, Patient Position: Sitting, Cuff Size: Normal)   Pulse 65   Temp 98.4 F (36.9 C) (Oral)   Resp 17   Ht 6' 0.01" (1.829 m)   Wt 170 lb 3.2 oz (77.2 kg)   SpO2 98%   BMI 23.08 kg/m    Subjective:    Patient ID: Maxwell Young, male    DOB: 09/17/1955, 68 y.o.   MRN: [id number]  HPI: Maxwell Young is a 68 y.o. male  Chief Complaint  Patient presents with   Diabetes    No complaints   DIABETES Hypoglycemic episodes:no Polydipsia/polyuria: no Visual disturbance: no Chest pain: no Paresthesias: no Glucose Monitoring: no Taking Insulin?: no Blood Pressure Monitoring: not checking Retinal Examination: scheduled for 3 weeks from now Foot Exam: Up to Date Diabetic Education: Completed Pneumovax: Up to Date Influenza: Up to Date Aspirin: yes  HYPERTENSION / HYPERLIPIDEMIA Satisfied with current treatment? yes Duration of hypertension: chronic BP monitoring frequency: rarely BP medication side effects: no Past BP meds: amlodipine, metoprolol Duration of hyperlipidemia: chronic Cholesterol medication side effects: no Cholesterol supplements: none Past cholesterol medications: crestor Medication compliance: excellent compliance Aspirin: yes Recent stressors: no Recurrent headaches: no Visual changes: no Palpitations: no Dyspnea: no Chest pain: no Lower extremity edema: no Dizzy/lightheaded: no  Relevant past medical, surgical, family and social history reviewed and updated as indicated. Interim medical history since our last visit reviewed. Allergies and medications reviewed and updated.  Review of Systems  Constitutional: Negative.   Respiratory: Negative.    Cardiovascular: Negative.   Musculoskeletal: Negative.   Neurological: Negative.   Psychiatric/Behavioral: Negative.      Per HPI unless specifically indicated above     Objective:    BP 100/64 (BP Location: Left Arm, Patient Position: Sitting, Cuff Size:  Normal)   Pulse 65   Temp 98.4 F (36.9 C) (Oral)   Resp 17   Ht 6' 0.01" (1.829 m)   Wt 170 lb 3.2 oz (77.2 kg)   SpO2 98%   BMI 23.08 kg/m   Wt Readings from Last 3 Encounters:  03/31/23 170 lb 3.2 oz (77.2 kg)  03/17/23 175 lb (79.4 kg)  02/20/23 169 lb 3.2 oz (76.7 kg)    Physical Exam Vitals and nursing note reviewed.  Constitutional:      General: He is not in acute distress.    Appearance: Normal appearance. He is not ill-appearing, toxic-appearing or diaphoretic.  HENT:     Head: Normocephalic and atraumatic.     Right Ear: External ear normal.     Left Ear: External ear normal.     Nose: Nose normal.     Mouth/Throat:     Mouth: Mucous membranes are moist.     Pharynx: Oropharynx is clear.  Eyes:     General: No scleral icterus.       Right eye: No discharge.        Left eye: No discharge.     Extraocular Movements: Extraocular movements intact.     Conjunctiva/sclera: Conjunctivae normal.     Pupils: Pupils are equal, round, and reactive to light.  Cardiovascular:     Rate and Rhythm: Normal rate and regular rhythm.     Pulses: Normal pulses.     Heart sounds: Normal heart sounds. No murmur heard.    No friction rub. No gallop.  Pulmonary:     Effort: Pulmonary effort is normal. No respiratory distress.     Breath sounds:  Normal breath sounds. No stridor. No wheezing, rhonchi or rales.  Chest:     Chest wall: No tenderness.  Musculoskeletal:        General: Normal range of motion.     Cervical back: Normal range of motion and neck supple.  Skin:    General: Skin is warm and dry.     Capillary Refill: Capillary refill takes less than 2 seconds.     Coloration: Skin is not jaundiced or pale.     Findings: No bruising, erythema, lesion or rash.  Neurological:     General: No focal deficit present.     Mental Status: He is alert and oriented to person, place, and time. Mental status is at baseline.  Psychiatric:        Mood and Affect: Mood normal.         Behavior: Behavior normal.        Thought Content: Thought content normal.        Judgment: Judgment normal.     Results for orders placed or performed during the hospital encounter of 01/07/23  Glucose, capillary   Collection Time: 01/07/23  6:21 AM  Result Value Ref Range   Glucose-Capillary 332 (H) 70 - 99 mg/dL  I-STAT, chem 8   Collection Time: 01/07/23  6:45 AM  Result Value Ref Range   Sodium 137 135 - 145 mmol/L   Potassium 3.7 3.5 - 5.1 mmol/L   Chloride 99 98 - 111 mmol/L   BUN 10 8 - 23 mg/dL   Creatinine, Ser 1.61 0.61 - 1.24 mg/dL   Glucose, Bld 096 (H) 70 - 99 mg/dL   Calcium, Ion 0.45 (L) 1.15 - 1.40 mmol/L   TCO2 23 22 - 32 mmol/L   Hemoglobin 15.0 13.0 - 17.0 g/dL   HCT 40.9 81.1 - 91.4 %  Glucose, capillary   Collection Time: 01/07/23  7:31 AM  Result Value Ref Range   Glucose-Capillary 320 (H) 70 - 99 mg/dL  Glucose, capillary   Collection Time: 01/07/23  8:49 AM  Result Value Ref Range   Glucose-Capillary 242 (H) 70 - 99 mg/dL  Glucose, capillary   Collection Time: 01/07/23  9:35 AM  Result Value Ref Range   Glucose-Capillary 285 (H) 70 - 99 mg/dL      Assessment & Plan:   Problem List Items Addressed This Visit       Cardiovascular and Mediastinum   Essential hypertension - Primary   Under good control on current regimen. Continue current regimen. Continue to monitor. Call with any concerns. Refills given. Labs drawn today.        Relevant Medications   amLODipine (NORVASC) 10 MG tablet   metoprolol succinate (TOPROL-XL) 100 MG 24 hr tablet   omega-3 acid ethyl esters (LOVAZA) 1 g capsule   rosuvastatin (CRESTOR) 40 MG tablet   Other Relevant Orders   CBC with Differential/Platelet   Comprehensive metabolic panel   CAD (coronary artery disease)   Will keep his BP and cholesterol under good control. Continue to monitor. Call with any concerns.       Relevant Medications   amLODipine (NORVASC) 10 MG tablet   metoprolol succinate  (TOPROL-XL) 100 MG 24 hr tablet   omega-3 acid ethyl esters (LOVAZA) 1 g capsule   rosuvastatin (CRESTOR) 40 MG tablet   Other Relevant Orders   CBC with Differential/Platelet   Comprehensive metabolic panel   Lipid Panel w/o Chol/HDL Ratio   Aortic atherosclerosis (HCC)   Will keep  his sugars, BP and cholesterol under good control. Continue to monitor. Call with any concerns.       Relevant Medications   amLODipine (NORVASC) 10 MG tablet   metoprolol succinate (TOPROL-XL) 100 MG 24 hr tablet   omega-3 acid ethyl esters (LOVAZA) 1 g capsule   rosuvastatin (CRESTOR) 40 MG tablet   Other Relevant Orders   CBC with Differential/Platelet   Comprehensive metabolic panel   Lipid Panel w/o Chol/HDL Ratio     Respiratory   COPD (chronic obstructive pulmonary disease) with emphysema (HCC)   Under good control on current regimen. Continue current regimen. Continue to monitor. Call with any concerns. Refills given.        Relevant Orders   CBC with Differential/Platelet   Comprehensive metabolic panel     Digestive   GERD (gastroesophageal reflux disease)   Under good control on current regimen. Continue current regimen. Continue to monitor. Call with any concerns. Refills given. Labs drawn today.        Relevant Medications   omeprazole (PRILOSEC) 20 MG capsule   Other Relevant Orders   CBC with Differential/Platelet   Comprehensive metabolic panel     Endocrine   Hyperlipidemia associated with type 2 diabetes mellitus (HCC)   Under good control on current regimen. Continue current regimen. Continue to monitor. Call with any concerns. Refills given. Labs drawn today.       Relevant Medications   amLODipine (NORVASC) 10 MG tablet   metFORMIN (GLUCOPHAGE) 500 MG tablet   metoprolol succinate (TOPROL-XL) 100 MG 24 hr tablet   omega-3 acid ethyl esters (LOVAZA) 1 g capsule   rosuvastatin (CRESTOR) 40 MG tablet   Other Relevant Orders   CBC with Differential/Platelet    Comprehensive metabolic panel   Lipid Panel w/o Chol/HDL Ratio   Type 2 diabetes mellitus with other specified complication (HCC)   Rechecking labs today. Await results, treat as needed. If A1c >8 will add jardiance. Await results.       Relevant Medications   metFORMIN (GLUCOPHAGE) 500 MG tablet   rosuvastatin (CRESTOR) 40 MG tablet   Blood Glucose Monitoring Suppl (CONTOUR NEXT ONE) KIT   Lancets (ONETOUCH ULTRASOFT) lancets   glucose blood test strip   Other Relevant Orders   CBC with Differential/Platelet   Comprehensive metabolic panel   Hgb A1c w/o eAG     Other   Abnormal PSA   Rechecking labs today. Await results. Treat as needed.       Relevant Orders   CBC with Differential/Platelet   Comprehensive metabolic panel   PSA     Follow up plan: Return in about 3 months (around 07/01/2023).

## 2023-03-31 NOTE — Assessment & Plan Note (Signed)
 Under good control on current regimen. Continue current regimen. Continue to monitor. Call with any concerns. Refills given. Labs drawn today.

## 2023-03-31 NOTE — Assessment & Plan Note (Signed)
 Will keep his sugars, BP and cholesterol under good control. Continue to monitor. Call with any concerns.

## 2023-03-31 NOTE — Assessment & Plan Note (Signed)
Will keep his BP and cholesterol under good control. Continue to monitor. Call with any concerns.  

## 2023-03-31 NOTE — Assessment & Plan Note (Signed)
 Rechecking labs today. Await results, treat as needed. If A1c >8 will add jardiance. Await results.

## 2023-03-31 NOTE — Assessment & Plan Note (Signed)
 Under good control on current regimen. Continue current regimen. Continue to monitor. Call with any concerns. Refills given.

## 2023-04-01 LAB — CBC WITH DIFFERENTIAL/PLATELET
Basophils Absolute: 0 10*3/uL (ref 0.0–0.2)
Basos: 0 %
EOS (ABSOLUTE): 0 10*3/uL (ref 0.0–0.4)
Eos: 0 %
Hematocrit: 44.4 % (ref 37.5–51.0)
Hemoglobin: 14.4 g/dL (ref 13.0–17.7)
Immature Grans (Abs): 0.1 10*3/uL (ref 0.0–0.1)
Immature Granulocytes: 1 %
Lymphocytes Absolute: 1.1 10*3/uL (ref 0.7–3.1)
Lymphs: 11 %
MCH: 30.3 pg (ref 26.6–33.0)
MCHC: 32.4 g/dL (ref 31.5–35.7)
MCV: 93 fL (ref 79–97)
Monocytes Absolute: 0.8 10*3/uL (ref 0.1–0.9)
Monocytes: 8 %
Neutrophils Absolute: 8.5 10*3/uL — ABNORMAL HIGH (ref 1.4–7.0)
Neutrophils: 80 %
Platelets: 152 10*3/uL (ref 150–450)
RBC: 4.76 x10E6/uL (ref 4.14–5.80)
RDW: 13.3 % (ref 11.6–15.4)
WBC: 10.6 10*3/uL (ref 3.4–10.8)

## 2023-04-01 LAB — COMPREHENSIVE METABOLIC PANEL
ALT: 15 IU/L (ref 0–44)
AST: 19 IU/L (ref 0–40)
Albumin: 4.6 g/dL (ref 3.9–4.9)
Alkaline Phosphatase: 74 IU/L (ref 44–121)
BUN/Creatinine Ratio: 13 (ref 10–24)
BUN: 10 mg/dL (ref 8–27)
Bilirubin Total: 0.3 mg/dL (ref 0.0–1.2)
CO2: 19 mmol/L — ABNORMAL LOW (ref 20–29)
Calcium: 9 mg/dL (ref 8.6–10.2)
Chloride: 104 mmol/L (ref 96–106)
Creatinine, Ser: 0.8 mg/dL (ref 0.76–1.27)
Globulin, Total: 2.2 g/dL (ref 1.5–4.5)
Glucose: 108 mg/dL — ABNORMAL HIGH (ref 70–99)
Potassium: 4.8 mmol/L (ref 3.5–5.2)
Sodium: 141 mmol/L (ref 134–144)
Total Protein: 6.8 g/dL (ref 6.0–8.5)
eGFR: 98 mL/min/{1.73_m2} (ref >59)

## 2023-04-01 LAB — LIPID PANEL W/O CHOL/HDL RATIO
Cholesterol, Total: 85 mg/dL — ABNORMAL LOW (ref 100–199)
HDL: 42 mg/dL (ref >39)
LDL Chol Calc (NIH): 25 mg/dL (ref 0–99)
Triglycerides: 91 mg/dL (ref 0–149)
VLDL Cholesterol Cal: 18 mg/dL (ref 5–40)

## 2023-04-01 LAB — HGB A1C W/O EAG: Hgb A1c MFr Bld: 7.1 % — ABNORMAL HIGH (ref 4.8–5.6)

## 2023-04-01 LAB — PSA: Prostate Specific Ag, Serum: 2.7 ng/mL (ref 0.0–4.0)

## 2023-04-07 ENCOUNTER — Telehealth: Payer: Self-pay

## 2023-04-07 NOTE — Telephone Encounter (Signed)
 Call report from [facility]  IMPRESSION: 1. New 12.8 mm irregular anterior segment left upper lobe nodule. Lung-RADS 4B, suspicious. Additional imaging evaluation or consultation with Pulmonology or Thoracic Surgery recommended. These results will be called to the ordering clinician or representative by the Radiologist Assistant, and communication documented in the PACS or [employer]. 2. Cholelithiasis. 3. Punctate right renal stone versus vascular calcification. 4.  Aortic atherosclerosis (ICD10-I70.0). 5.  Emphysema (ICD10-J43.9).

## 2023-04-08 ENCOUNTER — Encounter: Payer: Self-pay | Admitting: Family Medicine

## 2023-04-10 ENCOUNTER — Telehealth: Payer: Self-pay | Admitting: Acute Care

## 2023-04-10 DIAGNOSIS — R911 Solitary pulmonary nodule: Secondary | ICD-10-CM

## 2023-04-10 NOTE — Telephone Encounter (Signed)
 See telephone note from 04/10/2023

## 2023-04-10 NOTE — Telephone Encounter (Signed)
 I have called the patient with the results of his low dose Ct Chest. His scan was read as a LR 4B. He has a new 12.8 mm irregular left upper lobe pulmonary nodule. Plan is for a PET scan and then follow up with one of the docs in the [place] office to review and determine next best steps in the  plan of care. Pt. Is in agreement with this plan. PET scan has been ordered. He understands he will get a call to get this scheduled.

## 2023-04-16 ENCOUNTER — Ambulatory Visit
Admission: RE | Admit: 2023-04-16 | Discharge: 2023-04-16 | Disposition: A | Discharge status: Home / Self Care | Source: Ambulatory Visit | Attending: Acute Care | Admitting: Acute Care

## 2023-04-16 DIAGNOSIS — R911 Solitary pulmonary nodule: Secondary | ICD-10-CM | POA: Insufficient documentation

## 2023-04-16 LAB — GLUCOSE, CAPILLARY: Glucose-Capillary: 145 mg/dL — ABNORMAL HIGH (ref 70–99)

## 2023-04-16 MED ORDER — FLUDEOXYGLUCOSE F - 18 (FDG) INJECTION
8.8000 | Freq: Once | INTRAVENOUS | Status: AC | PRN
  Administered 2023-04-16: 9.27 via INTRAVENOUS

## 2023-04-23 DIAGNOSIS — Z961 Presence of intraocular lens: Secondary | ICD-10-CM | POA: Diagnosis not present

## 2023-04-23 DIAGNOSIS — E119 Type 2 diabetes mellitus without complications: Secondary | ICD-10-CM | POA: Diagnosis not present

## 2023-04-23 DIAGNOSIS — H353132 Nonexudative age-related macular degeneration, bilateral, intermediate dry stage: Secondary | ICD-10-CM | POA: Diagnosis not present

## 2023-04-23 DIAGNOSIS — H2512 Age-related nuclear cataract, left eye: Secondary | ICD-10-CM | POA: Diagnosis not present

## 2023-04-23 LAB — HM DIABETES EYE EXAM

## 2023-05-12 ENCOUNTER — Encounter: Payer: Self-pay | Admitting: Pulmonary Disease

## 2023-05-12 ENCOUNTER — Ambulatory Visit (INDEPENDENT_AMBULATORY_CARE_PROVIDER_SITE_OTHER): Admitting: Pulmonary Disease

## 2023-05-12 ENCOUNTER — Telehealth: Payer: Self-pay

## 2023-05-12 VITALS — BP 102/62 | HR 68 | Temp 97.8°F | Ht 72.0 in | Wt 171.8 lb

## 2023-05-12 DIAGNOSIS — J439 Emphysema, unspecified: Secondary | ICD-10-CM

## 2023-05-12 DIAGNOSIS — R911 Solitary pulmonary nodule: Secondary | ICD-10-CM | POA: Diagnosis not present

## 2023-05-12 DIAGNOSIS — Z87891 Personal history of nicotine dependence: Secondary | ICD-10-CM | POA: Diagnosis not present

## 2023-05-12 NOTE — H&P (View-Only) (Signed)
 Subjective:    Patient ID: Maxwell Young, male    DOB: 1955-08-28, 68 y.o.   MRN: [id number]  Patient Care Team: Solomon Dupre, DO as PCP - General (Family Medicine) End, Veryl Gottron, MD as PCP - Cardiology (Cardiology)  Chief Complaint  Patient presents with   Consult    Cough with clear phlegm. No wheezing or shortness of breath.     BACKGROUND: 68 year old former smoker with a 69 pack-year history of smoking, currently vaping, with a history as noted below who presents for evaluation of an abnormal lung cancer screening CT.  He is kindly referred by the lung cancer screening program, primary physician is Dr. Terre Ferri.   HPI Discussed the use of AI scribe software for clinical note transcription with the patient, who gave verbal consent to proceed.  History of Present Illness   Maxwell Young is a 68 year old male with emphysema who presents for evaluation of a lung nodule noted on lung cancer screening CT. Patient presents with his wife.  A lung nodule was identified during a lung cancer screening CT, and a subsequent PET scan showed uptake in the nodule, indicating activity. No history of chest pain is reported, but there was noted activity in the rib area on the left side during the scan. No recent shortness of breath, or wheezing, although he experienced shortness of breath before his heart surgery in 2008. He has been diagnosed with emphysema, which is described as 'pretty bad,' particularly in the upper portions of the lung.  He reports some weight loss, which his wife confirms, although he states it is not significant. He has not had lung function tests in many years, with the last known test conducted at [facility].  He has a significant smoking history, having smoked a pack to a pack and a half per day before quitting in November 2023. He continues to vape. 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  No prior military  history.   Review of Systems A 10 point review of systems was performed and it is as noted above otherwise negative.   Past Medical History:  Diagnosis Date   Abdominal aortic ectasia (HCC) 08/29/2022   a.) US  aorta 08/29/2022: 2.6 cm   Aortic atherosclerosis (HCC)    Bilateral inguinal hernia    CAD (coronary artery disease) 11/13/2006   a.) LHC 11/13/2006: 90% mLAD, 70% D1, 60% D2, 70% pLCx, 100% pRCA, 95% RV marginal - refer to CVTS; b.) s/p 3v CABG 11/21/2006   Cholelithiasis    Colon polyps 01/21/2009   DDD (degenerative disc disease), cervical    a.) s/p C4-C6 discectomy with interbody fusion and anterior plating 06/29/2001   Diastolic dysfunction    a.) TTE 12/11/2022: EF 60-65%, no RWMAs, G1DD, norm RVSF, mild MR, mild-mod TR, AoV sclerosis without stenosis.   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    S/P CABG x 3 11/21/2006   a.) LIMA-LAD, SVG-OM2, SVG-PDA   T2DM (type 2 diabetes mellitus) (HCC)     Past Surgical History:  Procedure Laterality Date   CATARACT EXTRACTION W/ INTRAOCULAR LENS IMPLANT Right 03/04/2022  CERVICAL FUSION N/A 06/29/2001   COLONOSCOPY     COLONOSCOPY N/A 09/14/2020   Procedure: COLONOSCOPY;  Surgeon: Marnee Sink, MD;  Location: [facility] ENDOSCOPY;  Service: Endoscopy;  Laterality: N/A;   COLONOSCOPY WITH PROPOFOL  N/A 07/24/2015   Procedure: COLONOSCOPY WITH PROPOFOL ;  Surgeon: Marnee Sink, MD;  Location: [facility];  Service: Endoscopy;  Laterality: N/A;   CORONARY ARTERY BYPASS GRAFT  11/21/2006   Procedure: CORONARY ARTERY BYPASS GRAFT; Location: [facility]; Surgeon: Phyliss Breen, MD   INSERTION OF MESH   01/07/2023   Procedure: INSERTION OF MESH;  Surgeon: Alben Alma, MD;  Location: [facility] ORS;  Service: General;;   LEFT HEART CATH AND CORONARY ANGIOGRAPHY Left 11/13/2006   Procedure: LEFT HEART CATH AND CORONARY ANGIOGRAPHY; Location: [facility]; Surgeon: Debborah Fairly, MD   POLYPECTOMY  07/24/2015   Procedure: POLYPECTOMY;  Surgeon: Marnee Sink, MD;  Location: [facility];  Service: Endoscopy;;    Patient Active Problem List   Diagnosis Date Noted   Non-recurrent bilateral inguinal hernia without obstruction or gangrene 01/07/2023   Abnormal EKG 11/28/2022   Ectatic abdominal aorta (HCC) 09/24/2022   Advance care planning 09/12/2022   Polyp of transverse colon    Abnormal PSA 08/31/2019   Benign prostatic hyperplasia with lower urinary tract symptoms 08/31/2019   Type 2 diabetes mellitus with other specified complication (HCC) 09/07/2018   COPD (chronic obstructive pulmonary disease) with emphysema (HCC) 08/26/2017   Aortic atherosclerosis (HCC) 02/24/2017   PAD (peripheral artery disease) (HCC) 02/24/2017   Personal history of tobacco use, presenting hazards to health 07/27/2015   History of colonic polyps    Benign neoplasm of transverse colon    Rectal polyp    Smoking greater than 40 pack years 06/13/2015   Essential hypertension 12/14/2014   CAD (coronary artery disease) 12/14/2014   GERD (gastroesophageal reflux disease) 12/14/2014   Hyperlipidemia associated with type 2 diabetes mellitus (HCC) 12/14/2014    Family History  Problem Relation Age of Onset   Hypertension Mother    Heart disease Mother        CAD   Heart failure Mother    Cancer Father        colon, prostate   Stroke Father    Parkinson's disease Father     Social History   Tobacco Use   Smoking status: Former    Current packs/day: 0.00    Average packs/day: 1.5 packs/day for 45.8 years (68.7 ttl pk-yrs)    Types: E-cigarettes, Cigarettes    Start date: 74    Quit date: 11/21/2021    Years  since quitting: 1.4    Passive exposure: Past   Smokeless tobacco: Never   Tobacco comments:    Currently vapes.  Substance Use Topics   Alcohol use: No    No Known Allergies  Current Meds  Medication Sig   amLODipine  (NORVASC ) 10 MG tablet Take 1 tablet (10 mg total) by mouth daily.   aspirin EC 81 MG tablet Take 81 mg by mouth daily. Swallow whole.   Blood Glucose Monitoring Suppl (CONTOUR NEXT ONE) KIT 1 each by Does not apply route in the morning and at bedtime.   glucose blood test strip USE DAILY TO CHECK BLOOD SUGAR.   Lancets (ONETOUCH ULTRASOFT) lancets USE DAILY TO CHECK BLOOD SUGAR.   metFORMIN  (GLUCOPHAGE ) 500 MG tablet Take 2 tablets (1,000 mg total) by mouth 2 (two) times daily with a meal.   metoprolol  succinate (TOPROL -XL) 100 MG 24 hr tablet Take 1  tablet (100 mg total) by mouth daily. Take with or immediately following a meal.   Multiple Vitamins-Minerals (MULTIVITAMIN ADULT PO) Take 1 tablet by mouth daily.   omega-3 acid ethyl esters (LOVAZA ) 1 g capsule TAKE 2 CAPSULES TWICE A DAY   omeprazole  (PRILOSEC) 20 MG capsule Take 1 capsule (20 mg total) by mouth every other day.   rosuvastatin  (CRESTOR ) 40 MG tablet Take 1 tablet (40 mg total) by mouth daily.   tamsulosin  (FLOMAX ) 0.4 MG CAPS capsule Take 2 capsules (0.8 mg total) by mouth daily.    Immunization History  Administered Date(s) Administered   Fluad Trivalent(High Dose 65+) 12/31/2022   Influenza,inj,Quad PF,6+ Mos 12/14/2014, 01/01/2016, 11/23/2020, 12/03/2021   PFIZER(Purple Top)SARS-COV-2 Vaccination 09/29/2019, 10/27/2019   PNEUMOCOCCAL CONJUGATE-20 06/11/2022   Pneumococcal Polysaccharide-23 08/23/2020   Pneumococcal-Unspecified 04/25/2011   Td 11/23/2020   Tdap 10/04/2010   Zoster Recombinant(Shingrix ) 12/03/2021, 03/05/2022        Objective:     BP 102/62 (BP Location: Right Arm, Patient Position: Sitting, Cuff Size: Normal)   Pulse 68   Temp 97.8 F (36.6 C) (Temporal)   Ht 6' (1.829  m)   Wt 171 lb 12.8 oz (77.9 kg)   SpO2 96%   BMI 23.30 kg/m   SpO2: 96 %  GENERAL: Well-developed, well-nourished gentleman, no acute distress.  Fully ambulatory, no conversational dyspnea. HEAD: Normocephalic, atraumatic.  EYES: Pupils equal, round, reactive to light.  No scleral icterus.  MOUTH: Dentures both.  Oral mucosa moist.  No thrush.  Mallampati II airway NECK: Supple. No thyromegaly. Trachea midline. No JVD.  No adenopathy. PULMONARY: Good air entry bilaterally.  No adventitious sounds. CARDIOVASCULAR: S1 and S2. Regular rate and rhythm.  No rubs, murmurs or gallops heard. ABDOMEN: Benign. MUSCULOSKELETAL: No joint deformity, no clubbing, no edema.  NEUROLOGIC: No overt focal deficit, no gait disturbance, speech is fluent. SKIN: Intact,warm,dry. PSYCH: Mood and behavior normal.  Representative image from CT performed 17 March 2023 showing the nodule in question (arrow):     Representative image from PET/CT performed 16 April 2023 showing activity in the nodule and adjacent area   Assessment & Plan:     ICD-10-CM   1. Nodule of left lung - 12mm  R91.1 CT SUPER D CHEST WO [facility] PILOT    2. Pulmonary emphysema, unspecified emphysema type (HCC)  J43.9 Pulmonary function test      Orders Placed This Encounter  Procedures   CT SUPER D CHEST WO [facility] PILOT    Standing Status:   Future    Expected Date:   05/25/2023    Expiration Date:   05/11/2024    Scheduling Instructions:     Please do before 26 May 2023.  Patient scheduled for procedure 26 May 2018 5 in the PM for biopsy of the lung.    Preferred imaging location?:   [facility]   Pulmonary function test    Standing Status:   Future    Expiration Date:   05/11/2024    Scheduling Instructions:     Schedule before end of week if possible.    Where should this test be performed?:   Outpatient Pulmonary    What type of PFT is being ordered?:   Full PFT   Discussion:    Lung nodule with concern for  cancer Lung nodule identified on lung cancer screening CT with increased uptake on PET scan, raising concern for malignancy or inflammation. The nodule's peripheral location complicates biopsy due to the risk of  pneumothorax, especially with concurrent emphysema.  Patient understands that his chance for pneumothorax related to procedure is higher than 3%.  No significant lymphadenopathy on PET scan. Biopsy is preferred over radiation without a biopsy to accurately determine the nodule's nature. Risks include potential pneumothorax, necessitating a chest tube and possible hospitalization. - Schedule robotic-assisted biopsy on May 26, 2023, at 12:30 PM. - Perform [facility] CT (planning CT) prior to biopsy. - Conduct pulmonary function tests before vacation. - Consider radiation therapy based on biopsy results and pulmonary function.  Emphysema Significant emphysema, particularly in the upper lobes. No recent pulmonary function tests. No dyspnea or wheezing reported. - Conduct pulmonary function tests before vacation.    Will see the patient in follow-up in 3-4 weeks time this is a routine post bronchoscopy follow-up.  However, we will stay in contact with the patient before, during and after the procedure.   Advised if symptoms do not improve or worsen, to please contact office for sooner follow up or seek emergency care.    I spent 45 minutes of dedicated to the care of this patient on the date of this encounter to include pre-visit review of records, face-to-face time with the patient discussing conditions above, post visit ordering of testing, clinical documentation with the electronic health record, making appropriate referrals as documented, and communicating necessary findings to members of the patients care team.   C. Chloe Counter, MD Advanced Bronchoscopy PCCM [facility]    *This note was dictated using voice recognition software/Dragon.  Despite best efforts to  proofread, errors can occur which can change the meaning. Any transcriptional errors that result from this process are unintentional and may not be fully corrected at the time of dictation.

## 2023-05-12 NOTE — Telephone Encounter (Signed)
 For the codes 36644, [phone], [phone] Prior Auth Not Required Refer # [id number]

## 2023-05-12 NOTE — Telephone Encounter (Addendum)
 ION Robotic Bronchoscopy 05/26/2023  12:30am Upper Left Lobe Lung Nodule [id number], [id number], [id number]  Maxwell Young please see Bronch info.

## 2023-05-12 NOTE — Progress Notes (Unsigned)
 Subjective:    Patient ID: Maxwell Young, male    DOB: 1955-08-28, 68 y.o.   MRN: [id number]  Patient Care Team: Solomon Dupre, DO as PCP - General (Family Medicine) End, Veryl Gottron, MD as PCP - Cardiology (Cardiology)  Chief Complaint  Patient presents with   Consult    Cough with clear phlegm. No wheezing or shortness of breath.     BACKGROUND: 68 year old former smoker with a 69 pack-year history of smoking, currently vaping, with a history as noted below who presents for evaluation of an abnormal lung cancer screening CT.  He is kindly referred by the lung cancer screening program, primary physician is Dr. Terre Ferri.   HPI Discussed the use of AI scribe software for clinical note transcription with the patient, who gave verbal consent to proceed.  History of Present Illness   Maxwell Young is a 68 year old male with emphysema who presents for evaluation of a lung nodule noted on lung cancer screening CT. Patient presents with his wife.  A lung nodule was identified during a lung cancer screening CT, and a subsequent PET scan showed uptake in the nodule, indicating activity. No history of chest pain is reported, but there was noted activity in the rib area on the left side during the scan. No recent shortness of breath, or wheezing, although he experienced shortness of breath before his heart surgery in 2008. He has been diagnosed with emphysema, which is described as 'pretty bad,' particularly in the upper portions of the lung.  He reports some weight loss, which his wife confirms, although he states it is not significant. He has not had lung function tests in many years, with the last known test conducted at [facility].  He has a significant smoking history, having smoked a pack to a pack and a half per day before quitting in November 2023. He continues to vape. 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  No prior military  history.   Review of Systems A 10 point review of systems was performed and it is as noted above otherwise negative.   Past Medical History:  Diagnosis Date   Abdominal aortic ectasia (HCC) 08/29/2022   a.) US  aorta 08/29/2022: 2.6 cm   Aortic atherosclerosis (HCC)    Bilateral inguinal hernia    CAD (coronary artery disease) 11/13/2006   a.) LHC 11/13/2006: 90% mLAD, 70% D1, 60% D2, 70% pLCx, 100% pRCA, 95% RV marginal - refer to CVTS; b.) s/p 3v CABG 11/21/2006   Cholelithiasis    Colon polyps 01/21/2009   DDD (degenerative disc disease), cervical    a.) s/p C4-C6 discectomy with interbody fusion and anterior plating 06/29/2001   Diastolic dysfunction    a.) TTE 12/11/2022: EF 60-65%, no RWMAs, G1DD, norm RVSF, mild MR, mild-mod TR, AoV sclerosis without stenosis.   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    S/P CABG x 3 11/21/2006   a.) LIMA-LAD, SVG-OM2, SVG-PDA   T2DM (type 2 diabetes mellitus) (HCC)     Past Surgical History:  Procedure Laterality Date   CATARACT EXTRACTION W/ INTRAOCULAR LENS IMPLANT Right 03/04/2022  CERVICAL FUSION N/A 06/29/2001   COLONOSCOPY     COLONOSCOPY N/A 09/14/2020   Procedure: COLONOSCOPY;  Surgeon: Marnee Sink, MD;  Location: [facility] ENDOSCOPY;  Service: Endoscopy;  Laterality: N/A;   COLONOSCOPY WITH PROPOFOL  N/A 07/24/2015   Procedure: COLONOSCOPY WITH PROPOFOL ;  Surgeon: Marnee Sink, MD;  Location: [facility];  Service: Endoscopy;  Laterality: N/A;   CORONARY ARTERY BYPASS GRAFT  11/21/2006   Procedure: CORONARY ARTERY BYPASS GRAFT; Location: [facility]; Surgeon: Phyliss Breen, MD   INSERTION OF MESH   01/07/2023   Procedure: INSERTION OF MESH;  Surgeon: Alben Alma, MD;  Location: [facility] ORS;  Service: General;;   LEFT HEART CATH AND CORONARY ANGIOGRAPHY Left 11/13/2006   Procedure: LEFT HEART CATH AND CORONARY ANGIOGRAPHY; Location: [facility]; Surgeon: Debborah Fairly, MD   POLYPECTOMY  07/24/2015   Procedure: POLYPECTOMY;  Surgeon: Marnee Sink, MD;  Location: [facility];  Service: Endoscopy;;    Patient Active Problem List   Diagnosis Date Noted   Non-recurrent bilateral inguinal hernia without obstruction or gangrene 01/07/2023   Abnormal EKG 11/28/2022   Ectatic abdominal aorta (HCC) 09/24/2022   Advance care planning 09/12/2022   Polyp of transverse colon    Abnormal PSA 08/31/2019   Benign prostatic hyperplasia with lower urinary tract symptoms 08/31/2019   Type 2 diabetes mellitus with other specified complication (HCC) 09/07/2018   COPD (chronic obstructive pulmonary disease) with emphysema (HCC) 08/26/2017   Aortic atherosclerosis (HCC) 02/24/2017   PAD (peripheral artery disease) (HCC) 02/24/2017   Personal history of tobacco use, presenting hazards to health 07/27/2015   History of colonic polyps    Benign neoplasm of transverse colon    Rectal polyp    Smoking greater than 40 pack years 06/13/2015   Essential hypertension 12/14/2014   CAD (coronary artery disease) 12/14/2014   GERD (gastroesophageal reflux disease) 12/14/2014   Hyperlipidemia associated with type 2 diabetes mellitus (HCC) 12/14/2014    Family History  Problem Relation Age of Onset   Hypertension Mother    Heart disease Mother        CAD   Heart failure Mother    Cancer Father        colon, prostate   Stroke Father    Parkinson's disease Father     Social History   Tobacco Use   Smoking status: Former    Current packs/day: 0.00    Average packs/day: 1.5 packs/day for 45.8 years (68.7 ttl pk-yrs)    Types: E-cigarettes, Cigarettes    Start date: 74    Quit date: 11/21/2021    Years  since quitting: 1.4    Passive exposure: Past   Smokeless tobacco: Never   Tobacco comments:    Currently vapes.  Substance Use Topics   Alcohol use: No    No Known Allergies  Current Meds  Medication Sig   amLODipine  (NORVASC ) 10 MG tablet Take 1 tablet (10 mg total) by mouth daily.   aspirin EC 81 MG tablet Take 81 mg by mouth daily. Swallow whole.   Blood Glucose Monitoring Suppl (CONTOUR NEXT ONE) KIT 1 each by Does not apply route in the morning and at bedtime.   glucose blood test strip USE DAILY TO CHECK BLOOD SUGAR.   Lancets (ONETOUCH ULTRASOFT) lancets USE DAILY TO CHECK BLOOD SUGAR.   metFORMIN  (GLUCOPHAGE ) 500 MG tablet Take 2 tablets (1,000 mg total) by mouth 2 (two) times daily with a meal.   metoprolol  succinate (TOPROL -XL) 100 MG 24 hr tablet Take 1  encounter.   No orders of the defined types were placed in this encounter.    Advised if symptoms do not improve or worsen, to please contact office for sooner follow up or seek emergency care.    I spent xxx minutes of dedicated to the care of this patient on the date of this encounter to include pre-visit review of records, face-to-face time with the patient discussing conditions above, post visit ordering of testing, clinical documentation with the electronic health record, making appropriate referrals as documented, and communicating necessary findings to members of the patients care team.   C. Chloe Counter, MD Advanced Bronchoscopy PCCM [facility]    *This note was dictated using voice recognition software/Dragon.  Despite best efforts to proofread, errors can occur which can change the meaning. Any transcriptional errors that result from this process are unintentional and may not be fully corrected at the time of dictation.

## 2023-05-12 NOTE — Patient Instructions (Signed)
 VISIT SUMMARY:  During your visit, we discussed the lung nodule found on your recent lung cancer screening CT and your existing emphysema. We reviewed the findings from your PET scan and planned the next steps for diagnosis and management.  YOUR PLAN:  -LUNG NODULE WITH CONCERN FOR CANCER: A lung nodule is a small growth in the lung that can be benign or malignant. Your PET scan showed activity in the nodule, which raises concern for cancer or inflammation. To determine the nature of the nodule, we will perform a robotic-assisted biopsy on May 26, 2023, at 12:30 PM. Before the biopsy, you will have a planning CT scan. Depending on the biopsy results and your lung function, we may consider radiation therapy.  -EMPHYSEMA: Emphysema is a chronic lung condition that causes shortness of breath due to damage to the air sacs in the lungs. Your emphysema is significant, especially in the upper parts of your lungs. We will conduct pulmonary function tests before your vacation to assess your lung health and guide further treatment.  INSTRUCTIONS:  Please ensure you attend the robotic-assisted biopsy on May 26, 2023, at 12:30 PM. Additionally, complete the pulmonary function tests before your vacation. Follow up with us  after these tests to discuss the results and next steps.    We discussed that the procedure would have to be done under general anesthesia. The anesthesia team will discuss his part of the process with you.  Complications from the procedure itself are usually minor. One potential complication would be collapse of the lung which can occur in 2-3% of the cases.  In your case, this potential is higher due to your significant emphysema and the location of the nodule.  If  this happens we would have to put a small tube to relieve the collapse and you would have to spend the night in the hospital in that event.  Another possibility would be that of bleeding, this is usually taken care of during the  procedure.  In the situations you may need to be observed overnight.  For the most part though, should be able to go home the same day.  Other possibilities could include that the procedure would be nondiagnostic meaning that no definitive diagnosis could be attained.  We strive to try to decrease these potential issues.

## 2023-05-13 NOTE — Telephone Encounter (Signed)
 Noted. Nothing further needed.

## 2023-05-14 ENCOUNTER — Ambulatory Visit

## 2023-05-14 DIAGNOSIS — J439 Emphysema, unspecified: Secondary | ICD-10-CM | POA: Diagnosis not present

## 2023-05-14 LAB — PULMONARY FUNCTION TEST
DL/VA % pred: 75 %
DL/VA: 3.07 ml/min/mmHg/L
DLCO unc % pred: 71 %
DLCO unc: 19.36 ml/min/mmHg
FEF 25-75 Post: 2.41 L/s
FEF 25-75 Pre: 1.87 L/s
FEF2575-%Change-Post: 29 %
FEF2575-%Pred-Post: 87 %
FEF2575-%Pred-Pre: 67 %
FEV1-%Change-Post: 6 %
FEV1-%Pred-Post: 92 %
FEV1-%Pred-Pre: 86 %
FEV1-Post: 3.27 L
FEV1-Pre: 3.06 L
FEV1FVC-%Change-Post: 1 %
FEV1FVC-%Pred-Pre: 93 %
FEV6-%Change-Post: 5 %
FEV6-%Pred-Post: 101 %
FEV6-%Pred-Pre: 96 %
FEV6-Post: 4.56 L
FEV6-Pre: 4.33 L
FEV6FVC-%Change-Post: 0 %
FEV6FVC-%Pred-Post: 103 %
FEV6FVC-%Pred-Pre: 103 %
FVC-%Change-Post: 5 %
FVC-%Pred-Post: 97 %
FVC-%Pred-Pre: 93 %
FVC-Post: 4.63 L
FVC-Pre: 4.41 L
Post FEV1/FVC ratio: 71 %
Post FEV6/FVC ratio: 99 %
Pre FEV1/FVC ratio: 69 %
Pre FEV6/FVC Ratio: 98 %
RV % pred: 103 %
RV: 2.5 L
TLC % pred: 102 %
TLC: 7.4 L

## 2023-05-14 NOTE — Patient Instructions (Signed)
 Full PFT completed today ? ?

## 2023-05-14 NOTE — Progress Notes (Signed)
 Full PFT completed today ? ?

## 2023-05-16 ENCOUNTER — Ambulatory Visit
Admission: RE | Admit: 2023-05-16 | Discharge: 2023-05-16 | Disposition: A | Discharge status: Home / Self Care | Source: Ambulatory Visit | Attending: Pulmonary Disease | Admitting: Pulmonary Disease

## 2023-05-16 DIAGNOSIS — R911 Solitary pulmonary nodule: Secondary | ICD-10-CM | POA: Insufficient documentation

## 2023-05-16 DIAGNOSIS — J432 Centrilobular emphysema: Secondary | ICD-10-CM | POA: Diagnosis not present

## 2023-05-19 ENCOUNTER — Encounter
Admission: RE | Admit: 2023-05-19 | Discharge: 2023-05-19 | Disposition: A | Discharge status: Home / Self Care | Source: Ambulatory Visit | Attending: Pulmonary Disease | Admitting: Pulmonary Disease

## 2023-05-19 ENCOUNTER — Other Ambulatory Visit: Payer: Self-pay

## 2023-05-19 DIAGNOSIS — Z01818 Encounter for other preprocedural examination: Secondary | ICD-10-CM

## 2023-05-19 HISTORY — DX: Elevated prostate specific antigen (PSA): R97.20

## 2023-05-19 HISTORY — DX: Abnormal electrocardiogram (ECG) (EKG): R94.31

## 2023-05-19 HISTORY — DX: Tobacco use: Z72.0

## 2023-05-19 HISTORY — DX: Personal history of nicotine dependence: Z87.891

## 2023-05-19 HISTORY — DX: Rectal polyp: K62.1

## 2023-05-19 HISTORY — DX: Benign prostatic hyperplasia with lower urinary tract symptoms: N40.1

## 2023-05-19 HISTORY — DX: Peripheral vascular disease, unspecified: I73.9

## 2023-05-19 HISTORY — DX: Solitary pulmonary nodule: R91.1

## 2023-05-19 NOTE — Patient Instructions (Signed)
 Your procedure is scheduled on:05-26-23 Monday Report to the Registration Desk on the 1st floor of the [facility].Then proceed to the 2nd floor Surgery Desk To find out your arrival time, please call [phone] between 1PM - 3PM on:05-23-23 Friday If your arrival time is 6:00 am, do not arrive before that time as the [facility] entrance doors do not open until 6:00 am.  REMEMBER: Instructions that are not followed completely may result in serious medical risk, up to and including death; or upon the discretion of your surgeon and anesthesiologist your surgery may need to be rescheduled.  Do not eat food OR drink any liquids after midnight the night before surgery.  No gum chewing or hard candies.  One week prior to surgery:Stop NOW (05-19-23) Stop Anti-inflammatories (NSAIDS) such as Advil, Aleve, Ibuprofen, Motrin, Naproxen, Naprosyn and Aspirin based products such as Excedrin, Goody's Powder, BC Powder. Stop ANY OVER THE COUNTER supplements until after surgery (Multivitamin)  You may however, continue to take Tylenol  if needed for pain up until the day of surgery.  Stop metFORMIN  (GLUCOPHAGE ) 2 days prior to surgery-Last dose will be on 05-23-23 Friday  Continue taking all of your other prescription medications up until the day of surgery.  ON THE DAY OF SURGERY ONLY TAKE THESE MEDICATIONS WITH SIPS OF WATER : -amLODipine  (NORVASC )  -metoprolol  succinate (TOPROL -XL)  -rosuvastatin  (CRESTOR )  -tamsulosin  (FLOMAX )   Continue your 81 mg Aspirin up until the day prior to surgery-Do NOT take the morning of surgery  No Alcohol for 24 hours before or after surgery.  No Smoking including e-cigarettes for 24 hours before surgery.  No chewable tobacco products for at least 6 hours before surgery.  No nicotine patches on the day of surgery.  Do not use any "recreational" drugs for at least a week (preferably 2 weeks) before your surgery.  Please be advised that the combination of cocaine  and anesthesia may have negative outcomes, up to and including death. If you test positive for cocaine, your surgery will be cancelled.  On the morning of surgery brush your teeth with toothpaste and water , you may rinse your mouth with mouthwash if you wish. Do not swallow any toothpaste or mouthwash.  Do not wear jewelry, make-up, hairpins, clips or nail polish.  For welded (permanent) jewelry: bracelets, anklets, waist bands, etc.  Please have this removed prior to surgery.  If it is not removed, there is a chance that hospital personnel will need to cut it off on the day of surgery.  Do not wear lotions, powders, or perfumes.   Do not shave body hair from the neck down 48 hours before surgery.  Contact lenses, hearing aids and dentures may not be worn into surgery.  Do not bring valuables to the hospital. [facility] is not responsible for any missing/lost belongings or valuables.   Notify your doctor if there is any change in your medical condition (cold, fever, infection).  Wear comfortable clothing (specific to your surgery type) to the hospital.  After surgery, you can help prevent lung complications by doing breathing exercises.  Take deep breaths and cough every 1-2 hours. Your doctor may order a device called an Incentive Spirometer to help you take deep breaths. When coughing or sneezing, hold a pillow firmly against your incision with both hands. This is called "splinting." Doing this helps protect your incision. It also decreases belly discomfort.  If you are being admitted to the hospital overnight, leave your suitcase in the car. After  surgery it may be brought to your room.  In case of increased patient census, it may be necessary for you, the patient, to continue your postoperative care in the Same Day Surgery department.  If you are being discharged the day of surgery, you will not be allowed to drive home. You will need a responsible individual to drive you home and  stay with you for 24 hours after surgery.   If you are taking public transportation, you will need to have a responsible individual with you.  Please call the Pre-admissions Testing Dept. at [phone] if you have any questions about these instructions.  Surgery Visitation Policy:  Patients having surgery or a procedure may have two visitors.  Children under the age of 54 must have an adult with them who is not the patient.

## 2023-05-20 ENCOUNTER — Telehealth: Payer: Self-pay

## 2023-05-20 NOTE — Telephone Encounter (Signed)
  Patient Consent for Virtual Visit        Maxwell Young has provided verbal consent on 05/20/2023 for a virtual visit (video or telephone).   CONSENT FOR VIRTUAL VISIT FOR:  [name]  By participating in this virtual visit I agree to the following:  I hereby voluntarily request, consent and authorize [facility] and its employed or contracted physicians, physician assistants, nurse practitioners or other licensed health care professionals (the Practitioner), to provide me with telemedicine health care services (the "Services") as deemed necessary by the treating Practitioner. I acknowledge and consent to receive the Services by the Practitioner via telemedicine. I understand that the telemedicine visit will involve communicating with the Practitioner through live audiovisual communication technology and the disclosure of certain medical information by electronic transmission. I acknowledge that I have been given the opportunity to request an in-person assessment or other available alternative prior to the telemedicine visit and am voluntarily participating in the telemedicine visit.  I understand that I have the right to withhold or withdraw my consent to the use of telemedicine in the course of my care at any time, without affecting my right to future care or treatment, and that the Practitioner or I may terminate the telemedicine visit at any time. I understand that I have the right to inspect all information obtained and/or recorded in the course of the telemedicine visit and may receive copies of available information for a reasonable fee.  I understand that some of the potential risks of receiving the Services via telemedicine include:  Delay or interruption in medical evaluation due to technological equipment failure or disruption; Information transmitted may not be sufficient (e.g. poor resolution of images) to allow for appropriate medical decision making by the  Practitioner; and/or  In rare instances, security protocols could fail, causing a breach of personal health information.  Furthermore, I acknowledge that it is my responsibility to provide information about my medical history, conditions and care that is complete and accurate to the best of my ability. I acknowledge that Practitioner's advice, recommendations, and/or decision may be based on factors not within their control, such as incomplete or inaccurate data provided by me or distortions of diagnostic images or specimens that may result from electronic transmissions. I understand that the practice of medicine is not an exact science and that Practitioner makes no warranties or guarantees regarding treatment outcomes. I acknowledge that a copy of this consent can be made available to me via my patient portal [facility] MyChart), or I can request a printed copy by calling the office of [facility].    I understand that my insurance will be billed for this visit.   I have read or had this consent read to me. I understand the contents of this consent, which adequately explains the benefits and risks of the Services being provided via telemedicine.  I have been provided ample opportunity to ask questions regarding this consent and the Services and have had my questions answered to my satisfaction. I give my informed consent for the services to be provided through the use of telemedicine in my medical care

## 2023-05-20 NOTE — Telephone Encounter (Signed)
   Name: Maxwell Young  DOB: 29-Sep-1955  MRN: [id number]  Primary Cardiologist: Sammy Crisp, MD   Preoperative team, please contact this patient and set up a phone call appointment for further preoperative risk assessment. Please obtain consent and complete medication review. Thank you for your help.  I confirm that guidance regarding antiplatelet and oral anticoagulation therapy has been completed and, if necessary, noted below.  Regarding ASA therapy, we recommend continuation of ASA throughout the perioperative period. However, if the surgeon feels that cessation of ASA is required in the perioperative period, it may be stopped 5-7 days prior to surgery with a plan to resume it as soon as felt to be feasible from a surgical standpoint in the post-operative period.    I also confirmed the patient resides in the state of [place] . As per [facility] Medical Board telemedicine laws, the patient must reside in the state in which the provider is licensed.   Carie Charity, NP 05/20/2023, 12:29 PM [facility]

## 2023-05-20 NOTE — Telephone Encounter (Signed)
   Pre-operative Risk Assessment    Patient Name: Maxwell Young  DOB: 1955-12-21 MRN: [id number]   Date of last office visit: 02/20/23 with Jaylene Metz, NP Date of next office visit: TBD   Request for Surgical Clearance    Procedure:  ENB/EBUS   Date of Surgery:  Clearance 05/26/23                                Surgeon:  Dr. Coralie Derrick, MD  Surgeon's Group or Practice Name:  [facility]  Phone number:  [phone]  Fax number:  [phone]     Type of Clearance Requested:   - Medical  - Pharmacy:  Hold Aspirin needs instructions when to hold   Type of Anesthesia:  General    Additional requests/questions:    SignedVira Grieves   05/20/2023, 11:40 AM

## 2023-05-21 ENCOUNTER — Ambulatory Visit: Attending: Cardiovascular Disease

## 2023-05-21 DIAGNOSIS — Z01810 Encounter for preprocedural cardiovascular examination: Secondary | ICD-10-CM | POA: Diagnosis not present

## 2023-05-21 NOTE — Progress Notes (Signed)
 Virtual Visit via Telephone Note   Because of Maxwell Young co-morbid illnesses, he is at least at moderate risk for complications without adequate follow up.  This format is felt to be most appropriate for this patient at this time.  Due to technical limitations with video connection (technology), today's appointment will be conducted as an audio only telehealth visit, and LOGIN BRUNKEN verbally agreed to proceed in this manner.   All issues noted in this document were discussed and addressed.  No physical exam could be performed with this format.  Evaluation Performed:  Preoperative cardiovascular risk assessment _____________   Date:  05/21/2023   Patient ID:  Maxwell Young, DOB [birth year], MRN [id number] Patient Location:  Home Provider location:   Office  Primary Care Provider:  Solomon Dupre, DO Primary Cardiologist:  Sammy Crisp, MD  Chief Complaint / Patient Profile  68 y.o. y/o male with a h/o coronary artery disease status post post CABG in 10/2006 at [facility] with SVG to OM and SVG to PDA, hyperlipidemia, hypertension, type 2 diabetes, GERD, who is pending ENB/EBUS and presents today for telephonic preoperative cardiovascular risk assessment. History of Present Illness   Maxwell Young is a 68 y.o. male who presents via audio/video conferencing for a telehealth visit today.  Pt was last seen in cardiology clinic on 02/20/23 by Ronald Cockayne, NP.  At that time ERRETT GOLDSCHMIDT was doing well.  The patient is now pending procedure as outlined above. Since his last visit, he has remained stable from a cardiac standpoint. Today he denies chest pain, shortness of breath, lower extremity edema, fatigue, palpitations, melena, hematuria, hemoptysis, diaphoresis, weakness, presyncope, syncope, orthopnea, and PND.  Past Medical History    Past Medical History:  Diagnosis Date   Abdominal aortic ectasia (HCC) 08/29/2022   a.) US  aorta 08/29/2022: 2.6 cm   Abnormal EKG     Abnormal PSA    Aortic atherosclerosis (HCC)    Bilateral inguinal hernia    BPH with elevated PSA and lower urinary tract symptoms    CAD (coronary artery disease) 11/13/2006   a.) LHC 11/13/2006: 90% mLAD, 70% D1, 60% D2, 70% pLCx, 100% pRCA, 95% RV marginal - refer to CVTS; b.) s/p 3v CABG 11/21/2006   Cholelithiasis    Colon polyps 01/21/2009   Colon polyps    DDD (degenerative disc disease), cervical    a.) s/p C4-C6 discectomy with interbody fusion and anterior plating 06/29/2001   Diastolic dysfunction    a.) TTE 12/11/2022: EF 60-65%, no RWMAs, G1DD, norm RVSF, mild MR, mild-mod TR, AoV sclerosis without stenosis.   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    S/P CABG x 3 11/21/2006   a.) LIMA-LAD, SVG-OM2, SVG-PDA   T2DM (type 2 diabetes mellitus) (HCC)    Vapes nicotine containing substance    Past Surgical History:  Procedure Laterality Date   CATARACT EXTRACTION W/ INTRAOCULAR LENS IMPLANT Right 03/04/2022   CERVICAL FUSION N/A  06/29/2001   COLONOSCOPY     COLONOSCOPY N/A 09/14/2020   Procedure: COLONOSCOPY;  Surgeon: Marnee Sink, MD;  Location: [facility] ENDOSCOPY;  Service: Endoscopy;  Laterality: N/A;   COLONOSCOPY WITH PROPOFOL  N/A 07/24/2015   Procedure: COLONOSCOPY WITH PROPOFOL ;  Surgeon: Marnee Sink, MD;  Location: [facility];  Service: Endoscopy;  Laterality: N/A;   CORONARY ARTERY BYPASS GRAFT  11/21/2006   Procedure: CORONARY ARTERY BYPASS GRAFT; Location: [facility]; Surgeon: Phyliss Breen, MD   INSERTION OF MESH   01/07/2023   Procedure: INSERTION OF MESH;  Surgeon: Alben Alma, MD;  Location: [facility] ORS;  Service: General;;   LEFT HEART CATH AND CORONARY ANGIOGRAPHY Left 11/13/2006   Procedure: LEFT HEART CATH AND CORONARY ANGIOGRAPHY; Location: [facility]; Surgeon: Debborah Fairly, MD   POLYPECTOMY  07/24/2015   Procedure: POLYPECTOMY;  Surgeon: Marnee Sink, MD;  Location: [facility];  Service: Endoscopy;;   Allergies No Known Allergies Home Medications    Prior to Admission medications   Medication Sig Start Date End Date Taking? Authorizing Provider  amLODipine  (NORVASC ) 10 MG tablet Take 1 tablet (10 mg total) by mouth daily. Patient taking differently: Take 10 mg by mouth every morning. 03/31/23   Terre Ferri P, DO  aspirin EC 81 MG tablet Take 81 mg by mouth daily. Swallow whole.    [provider]  Blood Glucose Monitoring Suppl (CONTOUR NEXT ONE) KIT 1 each by Does not apply route in the morning and at bedtime. 03/31/23   Terre Ferri P, DO  glucose blood test strip USE DAILY TO CHECK BLOOD SUGAR. 03/31/23   Terre Ferri P, DO  Lancets [facility] ULTRASOFT) lancets USE DAILY TO CHECK BLOOD SUGAR. 03/31/23   Terre Ferri P, DO  metFORMIN  (GLUCOPHAGE ) 500 MG tablet Take 2 tablets (1,000 mg total) by mouth 2 (two) times daily with a meal. 03/31/23   Johnson, Megan P, DO  metoprolol  succinate (TOPROL -XL) 100 MG 24 hr tablet Take 1 tablet (100 mg total) by mouth daily. Take with or immediately following a meal. Patient taking differently: Take 100 mg by mouth every morning. Take with or immediately following a meal. 03/31/23   Johnson, Megan P, DO  Multiple Vitamins-Minerals (MULTIVITAMIN ADULT PO) Take 1 tablet by mouth daily.    [provider]  omega-3 acid ethyl esters (LOVAZA ) 1 g capsule TAKE 2 CAPSULES TWICE A DAY Patient taking differently: Take 1 g by mouth 2 (two) times daily. TAKE 2 CAPSULES TWICE A DAY 03/31/23   Johnson, Megan P, DO  omeprazole  (PRILOSEC) 20 MG  capsule Take 1 capsule (20 mg total) by mouth every other day. 03/31/23   Johnson, Megan P, DO  rosuvastatin  (CRESTOR ) 40 MG tablet Take 1 tablet (40 mg total) by mouth daily. Patient taking differently: Take 40 mg by mouth every morning. 03/31/23   Johnson, Megan P, DO  tamsulosin  (FLOMAX ) 0.4 MG CAPS capsule Take 2 capsules (0.8 mg total) by mouth daily. Patient taking differently: Take 0.8 mg by mouth daily after breakfast. 03/31/23   Solomon Dupre, DO    Physical Exam  Vital Signs:  AAHAAN JOSEY does not have vital signs available for review today. Given telephonic nature of communication, physical exam is limited. AAOx3. NAD. Normal affect.  Speech and respirations are unlabored. Accessory Clinical Findings   None Assessment & Plan    1.  Preoperative Cardiovascular Risk Assessment: ENB/EBUS  Mr. Curvin's perioperative risk of a major cardiac event is 0.9% according to the Revised Cardiac Risk  Index (RCRI).  Therefore, he is at low risk for perioperative complications.   His functional capacity is good at 7.59 METs according to the [facility] Activity Status Index (DASI). Recommendations: According to ACC/AHA guidelines, no further cardiovascular testing needed.  The patient may proceed to surgery at acceptable risk.   Antiplatelet and/or Anticoagulation Recommendations: Patient can hold aspirin 81 mg daily for 5-7 days prior to procedure, please resume when safe to do so from a bleeding standpoint.    The patient was advised that if he develops new symptoms prior to surgery to contact our office to arrange for a follow-up visit, and he verbalized understanding.  A copy of this note will be routed to requesting surgeon.  Time:   Today, I have spent 10 minutes with the patient with telehealth technology discussing medical history, symptoms, and management plan.    Cas Tracz D Joshiah Traynham, NP  05/21/2023, 4:13 PM

## 2023-05-22 ENCOUNTER — Encounter: Payer: Self-pay | Admitting: Pulmonary Disease

## 2023-05-22 NOTE — Progress Notes (Signed)
 Perioperative / Anesthesia Services  Pre-Admission Testing Clinical Review / Pre-Operative Anesthesia Consult  Date: 05/22/23  Patient Demographics:  Name: Maxwell Young DOB: [birth year] MRN:   [id number]  Planned Surgical Procedure(s):    Case: [id number] Date/Time: 05/26/23 1215   Procedures:      BRONCHOSCOPY, WITH BIOPSY USING ELECTROMAGNETIC NAVIGATION (Left)     BRONCHOSCOPY, WITH EBUS (Left)   Anesthesia type: General   Diagnosis: Lung nodule [R91.1]   Pre-op diagnosis: Lung Nodule   Location: [facility] PROCEDURE RM 02 / [facility] ORS FOR ANESTHESIA GROUP   Surgeons: Marc Senior, MD     NOTE: Available PAT nursing documentation and vital signs have been reviewed. Clinical nursing staff has updated patient's PMH/PSHx, current medication list, and drug allergies/intolerances to ensure comprehensive history available to assist in medical decision making as it pertains to the aforementioned surgical procedure and anticipated anesthetic course. Extensive review of available clinical information personally performed.  PMH and PSHx updated with any diagnoses/procedures that  may have been inadvertently omitted during his intake with the pre-admission testing department's nursing staff.  Clinical Discussion:  ODIN Young is a 68 y.o. male who is submitted for pre-surgical anesthesia review and clearance prior to him undergoing the above procedure. Patient is a Former Smoker (40 pack years). Pertinent PMH includes: CAD (s/p CABG), diastolic dysfunction, aortic ectasia, bifascicular block (RBBB + LPFB), aortic atherosclerosis, HTN, HLD, T2DM, emphysema, GERD (on daily PPI), cervical DDD (s/p C4-C6 interbody fusion), BILATERAL inguinal hernia.  Patient is followed by cardiology (End, MD). He was last seen in the cardiology clinic on 02/20/2023; notes reviewed. At the time of his clinic visit, patient doing well overall from a cardiovascular perspective. Patient with mild  intermittent episodes of self limiting palpitations. Patient denied any chest pain, shortness of breath, PND, orthopnea, significant peripheral edema, weakness, fatigue, vertiginous symptoms, or presyncope/syncope. Patient with a past medical history significant for cardiovascular diagnoses. Documented physical exam was grossly benign, providing no evidence of acute exacerbation and/or decompensation of the patient's known cardiovascular conditions.  Patient underwent diagnostic LEFT heart catheterization on 11/13/2006 revealing multivessel CAD; 90% mid LAD, 70% D1, 60% D2, 70% proximal LCx, 100% proximal RCA, and 95% RV marginal stenosis.  Given the degree and complexity of patient's coronary artery disease, patient was referred to CVTS for consideration of revascularization.  Patient underwent three-vessel revascularization procedure on 11/21/2006.  LIMA-LAD, SVG-OM2, and SVG-PDA bypass grafts were placed.  Most recent TTE was performed on 12/11/2022 revealing normal left ventricular systolic function with an EF of 60 to 65%.  There were no regional wall motion abnormalities. Left ventricular diastolic Doppler parameters consistent with abnormal relaxation (G1DD).   Right ventricular size and function normal with a TAPSE measuring 1.9 cm  (normal range >/= 1.6 cm). There was mild mitral valve regurgitation.  There was mild to moderate tricuspid valve regurgitation.  Mild calcification of the aortic valve was observed. All transvalvular gradients were noted to be normal providing no evidence suggestive of valvular stenosis. Aorta normal in size with no evidence of aneurysmal dilatation.  Blood pressure well controlled at 117/72 mmHg on currently prescribed CCB (amlodipine ) and beta-blocker (metoprolol  succinate) therapies.  Patient is on rosuvastatin  + Lovaza  for his HLD diagnosis and ASCVD prevention.  T2DM reasonably controlled on currently prescribed regimen; A1c was 9.6% when checked on 12/30/2022. Of  note, since patient was last seen by cardiology, his A1c has been rechecked with improvement to 7.1% when checked on 03/31/2023.  Patient does not  have an OSAH diagnosis. Patient is able to complete all of his  ADL/IADLs without cardiovascular limitation.  Per the DASI, patient is able to achieve at least 4 METS of physical activity without experiencing any significant degree of angina/anginal equivalent symptoms.  No changes were made to his medication regimen.  Patient to follow-up with outpatient cardiology in 6 months or sooner if needed.  Maxwell Young underwent low-dose lung cancer screening chest CT on 03/17/2023.  Study revealed a new 12.8 mm irregular nodule in the anterior segment of the LEFT upper lobe.  Subsequent PET scan performed on 04/16/2023 further characterized the nodule as being 12 mm and spiculated in morphology; maximum SUV 7.2.  Patient was referred to pulmonology for further evaluation and discussions regarding tissue biopsy for definitive diagnostic purposes.  Patient has subsequently been scheduled for a BRONCHOSCOPY, WITH BIOPSY USING ELECTROMAGNETIC NAVIGATION (Left) on 05/26/2023 with Dr. Coralie Derrick, MD. Given patient's past medical history significant for cardiovascular diagnoses, presurgical cardiac clearance was sought by the PAT team. Per cardiology, "[name]'s perioperative risk of a major cardiac event is 0.9% according to the Revised Cardiac Risk Index (RCRI).  Therefore, he is at low risk for perioperative complications. His functional capacity is good at 7.59 METs according to the [facility] Activity Status Index (DASI). According to ACC/AHA guidelines, no further cardiovascular testing needed. The patient may proceed to surgery at ACCEPTABLE risk".  In review of the patient's chart, it is noted that he is on daily oral antithrombotic therapy. Given that patient's past medical history is significant for cardiovascular diagnoses, including but not limited to CAD,  pulmonary medicine has cleared patient to continue his daily low dose ASA throughout his perioperative course. He will be asked to hold his normal dose on the day of his procedure only. Patient has been updated on these directives from his specialty care providers by the PAT team.  Patient denies previous perioperative complications with anesthesia in the past. In review his EMR, it is noted that patient underwent a general course here at [facility] (ASA III) in 12/2022 with no documented complications.     05/14/2023    3:20 PM 05/12/2023    1:10 PM 03/31/2023    3:46 PM  Vitals with BMI  Height 5\' 11"  6\' 0"  6' 0.008"  Weight 170 lbs 171 lbs 13 oz 170 lbs 3 oz  BMI 23.72 23.3 23.08  Systolic  102 100  Diastolic  62 64  Pulse 78 68 65   Providers/Specialists:  NOTE: Primary physician provider listed below. Patient may have been seen by APP or partner within same practice.   PROVIDER ROLE / SPECIALTY LAST Adrienne Alberts, MD Pulmonary Medicine (Surgeon) 05/12/2023   Solomon Dupre, DO Primary Care Provider 03/31/2023  Sammy Crisp, MD Cardiology 02/20/2023; update call with preop APP on 05/21/2023   Allergies:  No Known Allergies Current Home Medications:   Current Outpatient Medications  Medication Instructions   amLODipine  (NORVASC ) 10 mg, Oral, Daily   aspirin EC 81 mg, Daily   Blood Glucose Monitoring Suppl (CONTOUR NEXT ONE) KIT 1 each, Does not apply, 2 times daily   glucose blood test strip USE DAILY TO CHECK BLOOD SUGAR.   Lancets (ONETOUCH ULTRASOFT) lancets USE DAILY TO CHECK BLOOD SUGAR.   metFORMIN  (GLUCOPHAGE ) 1,000 mg, Oral, 2 times daily with meals   metoprolol  succinate (TOPROL -XL) 100 mg, Oral, Daily, Take with or immediately following a meal.   Multiple Vitamins-Minerals (  MULTIVITAMIN ADULT PO) 1 tablet, Daily   omega-3 acid ethyl esters (LOVAZA ) 1 g capsule TAKE 2 CAPSULES TWICE A DAY   omeprazole  (PRILOSEC) 20 mg,  Oral, Every other day   rosuvastatin  (CRESTOR ) 40 mg, Oral, Daily   tamsulosin  (FLOMAX ) 0.8 mg, Oral, Daily    History:   Past Medical History:  Diagnosis Date   Abdominal aortic ectasia (HCC) 08/29/2022   a.) US  aorta 08/29/2022: 2.6 cm   Abnormal EKG    Abnormal PSA    Aortic atherosclerosis (HCC)    Bilateral inguinal hernia    BPH with elevated PSA and lower urinary tract symptoms    CAD (coronary artery disease) 11/13/2006   a.) LHC 11/13/2006: 90% mLAD, 70% D1, 60% D2, 70% pLCx, 100% pRCA, 95% RV marginal - refer to CVTS; b.) s/p 3v CABG 11/21/2006   Cholelithiasis    Colon polyps 01/21/2009   Colon polyps    DDD (degenerative disc disease), cervical    a.) s/p C4-C6 discectomy with interbody fusion and anterior plating 06/29/2001   Diastolic dysfunction    a.) TTE 12/11/2022: EF 60-65%, no RWMAs, G1DD, norm RVSF, mild MR, mild-mod TR, AoV sclerosis without stenosis.   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    S/P CABG x 3 11/21/2006   a.) LIMA-LAD, SVG-OM2, SVG-PDA   T2DM (type 2 diabetes mellitus) (HCC)    Vapes nicotine containing substance    Past Surgical History:  Procedure Laterality Date   CATARACT EXTRACTION W/ INTRAOCULAR LENS IMPLANT Right 03/04/2022   CERVICAL FUSION N/A 06/29/2001   COLONOSCOPY     COLONOSCOPY N/A 09/14/2020   Procedure: COLONOSCOPY;  Surgeon: Marnee Sink, MD;  Location: [facility] ENDOSCOPY;  Service: Endoscopy;  Laterality: N/A;   COLONOSCOPY WITH PROPOFOL  N/A 07/24/2015   Procedure:  COLONOSCOPY WITH PROPOFOL ;  Surgeon: Marnee Sink, MD;  Location: [facility];  Service: Endoscopy;  Laterality: N/A;   CORONARY ARTERY BYPASS GRAFT  11/21/2006   Procedure: CORONARY ARTERY BYPASS GRAFT; Location: [facility]; Surgeon: Phyliss Breen, MD   INSERTION OF MESH  01/07/2023   Procedure: INSERTION OF MESH;  Surgeon: Alben Alma, MD;  Location: [facility] ORS;  Service: General;;   LEFT HEART CATH AND CORONARY ANGIOGRAPHY Left 11/13/2006   Procedure: LEFT HEART CATH AND CORONARY ANGIOGRAPHY; Location: [facility]; Surgeon: Debborah Fairly, MD   POLYPECTOMY  07/24/2015   Procedure: POLYPECTOMY;  Surgeon: Marnee Sink, MD;  Location: [facility];  Service: Endoscopy;;   Family History  Problem Relation Age of Onset   Hypertension Mother    Heart disease Mother        CAD   Heart failure Mother    Cancer Father        colon, prostate   Stroke Father    Parkinson's disease Father    Social History   Tobacco Use   Smoking status: Former    Current packs/day: 0.00    Average packs/day: 1.5 packs/day for 45.8 years (68.7 ttl pk-yrs)  Types: E-cigarettes, Cigarettes    Start date: 89    Quit date: 11/21/2021    Years since quitting: 1.4    Passive exposure: Past   Smokeless tobacco: Never   Tobacco comments:    Currently vapes.  Substance Use Topics   Alcohol use: No   Pertinent Clinical Results:  LABS:   Lab Results  Component Value Date   WBC 10.6 03/31/2023   HGB 14.4 03/31/2023   HCT 44.4 03/31/2023   MCV 93 03/31/2023   PLT 152 03/31/2023   Lab Results  Component Value Date   NA 141 03/31/2023   CL 104 03/31/2023   K 4.8 03/31/2023   CO2 19 (L) 03/31/2023   BUN 10 03/31/2023   CREATININE 0.80 03/31/2023   EGFR 98 03/31/2023   CALCIUM  9.0 03/31/2023   ALBUMIN 4.6 03/31/2023   GLUCOSE 108 (H) 03/31/2023    ECG: Date: 11/27/2022  Time ECG obtained: 1505 PM Rate: 72 bpm Rhythm:  Normal sinus rhythm; bifascicular block (RBBB + LPFB) Axis (leads I and aVF):  left Intervals: PR 160 ms. QRS 132 ms. QTc 451 ms. ST segment and T wave changes: No evidence of acute T wave abnormalities or significant ST segment elevation or depression.  Comparison: Similar to previous tracing obtained on 09/12/2022   IMAGING / PROCEDURES: PULMONARY FUNCTION TESTING performed on 05/14/2023    Latest Ref Rng & Units 05/14/2023    2:57 PM  PFT Results  FVC-Pre L 4.41   FVC-Predicted Pre % 93   FVC-Post L 4.63   FVC-Predicted Post % 97   Pre FEV1/FVC % % 69   Post FEV1/FCV % % 71   FEV1-Pre L 3.06   FEV1-Predicted Pre % 86   FEV1-Post L 3.27   DLCO uncorrected ml/min/mmHg 19.36   DLCO UNC% % 71   DLVA Predicted % 75   TLC L 7.40   TLC % Predicted % 102   RV % Predicted % 103    NM PET IMAGE INITIAL (PI) SKULL BASE TO THIGH performed on 04/16/2023 Mild abnormal radiotracer uptake seen along the 12 mm spiculated left upper lobe lung nodule as seen on prior CT scan. A malignant process is in the differential. In addition there is some abnormal uptake along adjacent pleural thickening and some ground-glass in the left upper lobe. Overall recommend further evaluation. No additional areas of abnormal tracer uptake to suggest other areas of disease. Advanced emphysematous lung changes.  Diffuse vascular calcifications.  Gallstones.  Colonic diverticula.  Enlarged prostate.  Wall thickening the urinary bladder.  Punctate non-obstructing right-sided renal stone  CT CHEST LUNG CA SCREEN LOW DOSE W/O CM performed on 03/17/2023 New 12.8 mm irregular anterior segment left upper lobe nodule. Lung-RADS 4B, suspicious. Additional imaging evaluation or consultation with Pulmonology or Thoracic Surgery recommended. These results will be called to the ordering clinician or representative by the Radiologist Assistant, and communication documented in the PACS or [employer]. Cholelithiasis. Punctate right renal stone versus vascular calcification Aortic atherosclerosis   Emphysema  TRANSTHORACIC ECHOCARDIOGRAM performed on 12/11/2022 Left ventricular ejection fraction, by estimation, is 60 to 65%. The left ventricle has normal function. The left ventricle has no regional wall motion abnormalities. Left ventricular diastolic parameters are consistent with Grade I diastolic dysfunction (impaired relaxation).  Right ventricular systolic function is normal. The right ventricular size is normal. Tricuspid regurgitation signal is inadequate for assessing  PA pressure.   The mitral valve is normal in structure. Mild mitral valve regurgitation. No  evidence of mitral stenosis.  Tricuspid valve regurgitation is mild to moderate.  The aortic valve has an indeterminant number of cusps. There is mild calcification of the aortic valve. Aortic valve regurgitation is not visualized. Aortic valve sclerosis/calcification is present, without any evidence of aortic stenosis. Aortic valve mean gradient measures 4.0 mmHg.  The inferior vena cava is normal in size with greater than 50% respiratory variability, suggesting right atrial pressure of 3 mmHg.   US  AORTA performed on 09/18/2022 Ectatic abdominal aorta, 2.6 cm. Five year follow up is recommended.    Impression and Plan:  HOLMES PILOTTI has been referred for pre-anesthesia review and clearance prior to him undergoing the planned anesthetic and procedural courses. Available labs, pertinent testing, and imaging results were personally reviewed by me in preparation for upcoming operative/procedural course. [facility] medical record has been updated following extensive record review and patient interview with PAT staff.   This patient has been appropriately cleared by cardiology with an overall ACCEPTABLE risk of experiencing significant perioperative cardiovascular complications. Based on clinical review performed today (05/22/23), barring any significant acute changes in the patient's overall condition, it is anticipated that  he will be able to proceed with the planned surgical intervention. Any acute changes in clinical condition may necessitate his procedure being postponed and/or cancelled. Patient will meet with anesthesia team (MD and/or CRNA) on the day of his procedure for preoperative evaluation/assessment. Questions regarding anesthetic course will be fielded at that time.   Pre-surgical instructions were reviewed with the patient during his PAT appointment, and questions were fielded to satisfaction by PAT clinical staff. He has been instructed on which medications that he will need to hold prior to surgery, as well as the ones that have been deemed safe/appropriate to take on the day of his procedure. As part of the general education provided by PAT, patient made aware both verbally and in writing, that he would need to abstain from the use of any illegal substances during his perioperative course. He was advised that failure to follow the provided instructions could necessitate case cancellation or result in serious perioperative complications up to and including death. Patient encouraged to contact PAT and/or his surgeon's office to discuss any questions or concerns that may arise prior to surgery; verbalized understanding.   Renate Caroline, MSN, APRN, FNP-C, CEN [facility]  Perioperative Services Nurse Practitioner Phone: [phone] Fax: [phone] 05/22/23 12:51 PM  NOTE: This note has been prepared using Dragon dictation software. Despite my best ability to proofread, there is always the potential that unintentional transcriptional errors may still occur from this process.

## 2023-05-23 ENCOUNTER — Ambulatory Visit

## 2023-05-23 ENCOUNTER — Encounter: Payer: Self-pay | Admitting: Pulmonary Disease

## 2023-05-23 NOTE — Telephone Encounter (Signed)
 Patient called to see did they speak with insurance about the procedure he is having on may 5th . Patient was wanting to make sure everything was ok with the insurance . Did relay the message I seen from Ms Merritt Ables to patient

## 2023-05-26 ENCOUNTER — Ambulatory Visit: Payer: Self-pay | Admitting: Urgent Care

## 2023-05-26 ENCOUNTER — Ambulatory Visit
Admission: RE | Admit: 2023-05-26 | Discharge: 2023-05-26 | Disposition: A | Discharge status: Home / Self Care | Attending: Pulmonary Disease | Admitting: Pulmonary Disease

## 2023-05-26 ENCOUNTER — Other Ambulatory Visit: Payer: Self-pay

## 2023-05-26 ENCOUNTER — Ambulatory Visit

## 2023-05-26 ENCOUNTER — Encounter: Payer: Self-pay | Admitting: Pulmonary Disease

## 2023-05-26 ENCOUNTER — Encounter
Admission: RE | Disposition: A | Discharge status: Home / Self Care | Payer: Self-pay | Source: Home / Self Care | Attending: Pulmonary Disease

## 2023-05-26 DIAGNOSIS — J439 Emphysema, unspecified: Secondary | ICD-10-CM | POA: Insufficient documentation

## 2023-05-26 DIAGNOSIS — Z48813 Encounter for surgical aftercare following surgery on the respiratory system: Secondary | ICD-10-CM | POA: Diagnosis not present

## 2023-05-26 DIAGNOSIS — F17290 Nicotine dependence, other tobacco product, uncomplicated: Secondary | ICD-10-CM | POA: Insufficient documentation

## 2023-05-26 DIAGNOSIS — K219 Gastro-esophageal reflux disease without esophagitis: Secondary | ICD-10-CM | POA: Insufficient documentation

## 2023-05-26 DIAGNOSIS — I452 Bifascicular block: Secondary | ICD-10-CM | POA: Insufficient documentation

## 2023-05-26 DIAGNOSIS — E119 Type 2 diabetes mellitus without complications: Secondary | ICD-10-CM | POA: Diagnosis not present

## 2023-05-26 DIAGNOSIS — Z7984 Long term (current) use of oral hypoglycemic drugs: Secondary | ICD-10-CM | POA: Insufficient documentation

## 2023-05-26 DIAGNOSIS — I083 Combined rheumatic disorders of mitral, aortic and tricuspid valves: Secondary | ICD-10-CM | POA: Diagnosis not present

## 2023-05-26 DIAGNOSIS — Z951 Presence of aortocoronary bypass graft: Secondary | ICD-10-CM | POA: Insufficient documentation

## 2023-05-26 DIAGNOSIS — Z01818 Encounter for other preprocedural examination: Secondary | ICD-10-CM

## 2023-05-26 DIAGNOSIS — I2510 Atherosclerotic heart disease of native coronary artery without angina pectoris: Secondary | ICD-10-CM | POA: Insufficient documentation

## 2023-05-26 DIAGNOSIS — I10 Essential (primary) hypertension: Secondary | ICD-10-CM | POA: Insufficient documentation

## 2023-05-26 DIAGNOSIS — I700 Atherosclerosis of aorta: Secondary | ICD-10-CM | POA: Insufficient documentation

## 2023-05-26 DIAGNOSIS — J40 Bronchitis, not specified as acute or chronic: Secondary | ICD-10-CM | POA: Diagnosis not present

## 2023-05-26 DIAGNOSIS — R911 Solitary pulmonary nodule: Secondary | ICD-10-CM | POA: Diagnosis not present

## 2023-05-26 DIAGNOSIS — J449 Chronic obstructive pulmonary disease, unspecified: Secondary | ICD-10-CM | POA: Diagnosis not present

## 2023-05-26 DIAGNOSIS — Z87891 Personal history of nicotine dependence: Secondary | ICD-10-CM | POA: Diagnosis not present

## 2023-05-26 DIAGNOSIS — J984 Other disorders of lung: Secondary | ICD-10-CM | POA: Diagnosis not present

## 2023-05-26 HISTORY — PX: BRONCHOSCOPY, WITH BIOPSY USING ELECTROMAGNETIC NAVIGATION: SHX7536

## 2023-05-26 HISTORY — DX: Calculus of kidney: N20.0

## 2023-05-26 HISTORY — PX: VIDEO BRONCHOSCOPY WITH ENDOBRONCHIAL ULTRASOUND: SHX6177

## 2023-05-26 LAB — GLUCOSE, CAPILLARY
Glucose-Capillary: 170 mg/dL — ABNORMAL HIGH (ref 70–99)
Glucose-Capillary: 194 mg/dL — ABNORMAL HIGH (ref 70–99)

## 2023-05-26 SURGERY — BRONCHOSCOPY, WITH BIOPSY USING ELECTROMAGNETIC NAVIGATION
Anesthesia: General | Laterality: Left

## 2023-05-26 MED ORDER — FENTANYL CITRATE (PF) 100 MCG/2ML IJ SOLN
INTRAMUSCULAR | Status: DC | PRN
  Administered 2023-05-26: 50 ug via INTRAVENOUS

## 2023-05-26 MED ORDER — IPRATROPIUM-ALBUTEROL 0.5-2.5 (3) MG/3ML IN SOLN
RESPIRATORY_TRACT | Status: AC
  Filled 2023-05-26: qty 3

## 2023-05-26 MED ORDER — DEXAMETHASONE SODIUM PHOSPHATE 10 MG/ML IJ SOLN
INTRAMUSCULAR | Status: DC | PRN
Start: 2023-05-26 — End: 2023-05-26
  Administered 2023-05-26: 10 mg via INTRAVENOUS

## 2023-05-26 MED ORDER — OXYCODONE HCL 5 MG/5ML PO SOLN: 5.0000 mg | Freq: Once | ORAL | Status: DC | PRN

## 2023-05-26 MED ORDER — SODIUM CHLORIDE 0.9 % IV SOLN: INTRAVENOUS | Status: DC

## 2023-05-26 MED ORDER — ACETAMINOPHEN 10 MG/ML IV SOLN: 1000.0000 mg | Freq: Once | INTRAVENOUS | Status: DC | PRN

## 2023-05-26 MED ORDER — FENTANYL CITRATE (PF) 100 MCG/2ML IJ SOLN: 25.0000 ug | INTRAMUSCULAR | Status: DC | PRN

## 2023-05-26 MED ORDER — FENTANYL CITRATE (PF) 100 MCG/2ML IJ SOLN
INTRAMUSCULAR | Status: AC
  Filled 2023-05-26: qty 2

## 2023-05-26 MED ORDER — IPRATROPIUM-ALBUTEROL 0.5-2.5 (3) MG/3ML IN SOLN
3.0000 mL | Freq: Once | RESPIRATORY_TRACT | Status: AC
  Administered 2023-05-26: 3 mL via RESPIRATORY_TRACT

## 2023-05-26 MED ORDER — CHLORHEXIDINE GLUCONATE 0.12 % MT SOLN
OROMUCOSAL | Status: AC
  Filled 2023-05-26: qty 15

## 2023-05-26 MED ORDER — GLYCOPYRROLATE 0.2 MG/ML IJ SOLN
INTRAMUSCULAR | Status: DC | PRN
  Administered 2023-05-26: .2 mg via INTRAVENOUS

## 2023-05-26 MED ORDER — SUGAMMADEX SODIUM 200 MG/2ML IV SOLN
INTRAVENOUS | Status: DC | PRN
  Administered 2023-05-26: 200 mg via INTRAVENOUS

## 2023-05-26 MED ORDER — ONDANSETRON HCL 4 MG/2ML IJ SOLN: 4.0000 mg | Freq: Once | INTRAMUSCULAR | Status: DC | PRN

## 2023-05-26 MED ORDER — CHLORHEXIDINE GLUCONATE 0.12 % MT SOLN
15.0000 mL | Freq: Once | OROMUCOSAL | Status: AC
  Administered 2023-05-26: 15 mL via OROMUCOSAL

## 2023-05-26 MED ORDER — LIDOCAINE HCL (CARDIAC) PF 100 MG/5ML IV SOSY
PREFILLED_SYRINGE | INTRAVENOUS | Status: DC | PRN
  Administered 2023-05-26: 100 mg via INTRAVENOUS

## 2023-05-26 MED ORDER — PROPOFOL 1000 MG/100ML IV EMUL
INTRAVENOUS | Status: AC
  Filled 2023-05-26: qty 100

## 2023-05-26 MED ORDER — VASOPRESSIN 20 UNIT/ML IV SOLN
INTRAVENOUS | Status: DC | PRN
Start: 2023-05-26 — End: 2023-05-26
  Administered 2023-05-26 (×3): 4 [IU] via INTRAVENOUS

## 2023-05-26 MED ORDER — ROCURONIUM BROMIDE 100 MG/10ML IV SOLN
INTRAVENOUS | Status: DC | PRN
  Administered 2023-05-26: 20 mg via INTRAVENOUS
  Administered 2023-05-26: 50 mg via INTRAVENOUS

## 2023-05-26 MED ORDER — PROPOFOL 10 MG/ML IV BOLUS
INTRAVENOUS | Status: DC | PRN
  Administered 2023-05-26: 100 mg via INTRAVENOUS

## 2023-05-26 MED ORDER — SODIUM CHLORIDE 0.9 % IV SOLN: Freq: Once | INTRAVENOUS | Status: AC

## 2023-05-26 MED ORDER — LACTATED RINGERS IV SOLN: INTRAVENOUS | Status: DC

## 2023-05-26 MED ORDER — PHENYLEPHRINE HCL-NACL 20-0.9 MG/250ML-% IV SOLN
INTRAVENOUS | Status: DC | PRN
  Administered 2023-05-26: 40 ug/min via INTRAVENOUS

## 2023-05-26 MED ORDER — SUCCINYLCHOLINE CHLORIDE 200 MG/10ML IV SOSY
PREFILLED_SYRINGE | INTRAVENOUS | Status: DC | PRN
  Administered 2023-05-26: 100 mg via INTRAVENOUS

## 2023-05-26 MED ORDER — ONDANSETRON HCL 4 MG/2ML IJ SOLN
INTRAMUSCULAR | Status: DC | PRN
  Administered 2023-05-26 (×2): 4 mg via INTRAVENOUS

## 2023-05-26 MED ORDER — ORAL CARE MOUTH RINSE: 15.0000 mL | Freq: Once | OROMUCOSAL | Status: AC

## 2023-05-26 MED ORDER — OXYCODONE HCL 5 MG PO TABS: 5.0000 mg | ORAL_TABLET | Freq: Once | ORAL | Status: DC | PRN

## 2023-05-26 MED ORDER — PHENYLEPHRINE 80 MCG/ML (10ML) SYRINGE FOR IV PUSH (FOR BLOOD PRESSURE SUPPORT)
PREFILLED_SYRINGE | INTRAVENOUS | Status: DC | PRN
  Administered 2023-05-26: 80 ug via INTRAVENOUS
  Administered 2023-05-26: 160 ug via INTRAVENOUS

## 2023-05-26 NOTE — Anesthesia Preprocedure Evaluation (Addendum)
 Anesthesia Evaluation  Patient identified by MRN, date of birth, ID band Patient awake    Reviewed: Allergy & Precautions, NPO status , Patient's Chart, lab work & pertinent test results  History of Anesthesia Complications Negative for: history of anesthetic complications  Airway Mallampati: III   Neck ROM: Full    Dental  (+) Upper Dentures, Lower Dentures   Pulmonary COPD, former smoker (quit 2023; current vaping)   Pulmonary exam normal breath sounds clear to auscultation       Cardiovascular hypertension, + CAD (s/p CABG) and + Peripheral Vascular Disease  Normal cardiovascular exam Rhythm:Regular Rate:Normal  Echo 12/11/22:  1. Left ventricular ejection fraction, by estimation, is 60 to 65%. The left ventricle has normal function. The left ventricle has no regional wall motion abnormalities. Left ventricular diastolic parameters are consistent with Grade I diastolic dysfunction (impaired relaxation).   2. Right ventricular systolic function is normal. The right ventricular size is normal. Tricuspid regurgitation signal is inadequate for assessing PA pressure.   3. The mitral valve is normal in structure. Mild mitral valve regurgitation. No evidence of mitral stenosis.   4. Tricuspid valve regurgitation is mild to moderate.   5. The aortic valve has an indeterminant number of cusps. There is mild calcification of the aortic valve. Aortic valve regurgitation is not visualized. Aortic valve sclerosis/calcification is present, without any evidence of aortic stenosis. Aortic valve mean gradient measures 4.0 mmHg.   6. The inferior vena cava is normal in size with greater than 50% respiratory variability, suggesting right atrial pressure of 3 mmHg.   ECG 11/27/22:  Normal sinus rhythm Right bundle branch block Left posterior fasicular block Bifascicular block Abnormal ECG When compared with ECG of 12-Sep-2022 No significant change  was found   Neuro/Psych negative neurological ROS     GI/Hepatic ,GERD  Medicated and Controlled,,  Endo/Other  diabetes, Type 2    Renal/GU      Musculoskeletal   Abdominal   Peds  Hematology negative hematology ROS (+)   Anesthesia Other Findings Reviewed and agree with Donnetta Gains pre-anesthesia clinical review note.    Cardiology note 05/21/23:  1.  Preoperative Cardiovascular Risk Assessment: ENB/EBUS   Mr. Nadel's perioperative risk of a major cardiac event is 0.9% according to the Revised Cardiac Risk Index (RCRI).  Therefore, he is at low risk for perioperative complications.   His functional capacity is good at 7.59 METs according to the [facility] Activity Status Index (DASI). Recommendations: According to ACC/AHA guidelines, no further cardiovascular testing needed.  The patient may proceed to surgery at acceptable risk.     Cardiology note 02/20/23:  1. Coronary artery disease status post three-vessel CABG in 2008.  He denies any anginal or anginal equivalents.  Recent echocardiogram revealed LVEF of 60 to 65%, no RWMA, G1 DD, mild MR mild to moderate TR.  He is continued on aspirin 81 mg daily rosuvastatin  40 mg daily.  No further ischemic workup needed at this time.   2. Primary hypertension with blood pressure today 117/72.  He is continued on amlodipine  10 mg daily and Toprol -XL 100 mg daily.  Blood pressures have remained stable.  He has been encouraged to continue to monitor his pressures 1 to 2 hours postmedication administration at home as well.   3. Hyperlipidemia associated with type 2 diabetes with last LDL 33 08/2022 and his last hemoglobin A1c of 9.6 in December 2024.  He is continued on rosuvastatin  40 mg daily and Lovaza  1 g 2 capsules  twice daily.  He diabetes management is maintained by his PCP where he is continued on metformin  1000 mg twice daily.   Dispo: Patient return to clinic to see me/APP in 6 months or sooner if needed for evaluation of  symptoms   Reproductive/Obstetrics                             Anesthesia Physical Anesthesia Plan  ASA: 3  Anesthesia Plan: General   Post-op Pain Management:    Induction: Intravenous  PONV Risk Score and Plan: 2 and Ondansetron , Dexamethasone and Treatment may vary due to age or medical condition  Airway Management Planned: Oral ETT  Additional Equipment:   Intra-op Plan:   Post-operative Plan: Extubation in OR  Informed Consent: I have reviewed the patients History and Physical, chart, labs and discussed the procedure including the risks, benefits and alternatives for the proposed anesthesia with the patient or authorized representative who has indicated his/her understanding and acceptance.     Dental advisory given  Plan Discussed with: CRNA  Anesthesia Plan Comments: (Patient consented for risks of anesthesia including but not limited to:  - adverse reactions to medications - damage to eyes, teeth, lips or other oral mucosa - nerve damage due to positioning  - sore throat or hoarseness - damage to heart, brain, nerves, lungs, other parts of body or loss of life  Informed patient about role of CRNA in peri- and intra-operative care.  Patient voiced understanding.)        Anesthesia Quick Evaluation

## 2023-05-26 NOTE — Anesthesia Postprocedure Evaluation (Signed)
 Anesthesia Post Note  Patient: Corporate investment banker  Procedure(s) Performed: BRONCHOSCOPY, WITH BIOPSY USING ELECTROMAGNETIC NAVIGATION (Left) BRONCHOSCOPY, WITH EBUS (Left)  Patient location during evaluation: PACU Anesthesia Type: General Level of consciousness: awake and alert Pain management: pain level controlled Vital Signs Assessment: post-procedure vital signs reviewed and stable Respiratory status: spontaneous breathing, nonlabored ventilation, respiratory function stable and patient connected to nasal cannula oxygen Cardiovascular status: blood pressure returned to baseline and stable Postop Assessment: no apparent nausea or vomiting Anesthetic complications: no   No notable events documented.   Last Vitals:  Vitals:   05/26/23 1428 05/26/23 1438  BP: 112/63 118/67  Pulse: 63 69  Resp: 16 17  Temp: (!) 36.1 C (!) 36.1 C  SpO2: 99% 97%    Last Pain:  Vitals:   05/26/23 1438  TempSrc: Temporal  PainSc: 0-No pain                 Nancey Awkward

## 2023-05-26 NOTE — Op Note (Signed)
 PROCEDURES: Robotic assisted bronchoscopy with Ion platform 3D fluoroscopy   Indication: 68 year old former smoker with a 69-pack-year history of smoking, currently vapes presented with left upper lobe nodule found incidentally on lung cancer screening CT.  Preoperative Diagnosis: Left upper lobe nodule, FDG avid on PET, rule out cancer Post Procedure Diagnosis: Same as above Consent: Verbal/Written: obtained  Benefits, limitations and potential complications of the procedure were discussed with the patient/family.  Complications from bronchoscopy are rare and most often minor, but if they occur they may include breathing difficulty, vocal cord spasm, hoarseness, slight fever, vomiting, dizziness, bronchospasm, infection, low blood oxygen, bleeding from biopsy site, or an allergic reaction to medications.  It is uncommon for patients to experience other more serious complications for example: Collapsed lung requiring chest tube placement, respiratory failure, heart attack and/or cardiac arrhythmia.  Patient understood the potential complications and agreed to proceed.  Surgeon: Acey Ace, MD Assistant/Scrub: Karyle Pagoda, RRT Circulator: Suszanne Eriksson, RRT Anesthesiologist/CRNA: Dorothey Gate, MD/Tawana Maxwell Spark, CRNA Cytotechnology: Provided by [facility]ology Fluoroscopy technicians: Merribeth Abbott, RT Lem Pyle, RT Representatives:   Type of Anesthesia: General endotracheal  Procedures Performed:   Robotic bronchoscopy: Procedure consists of robotic navigation comprised of shape since pattern recognition and robotic kinematic data - to triangulate bronchoscope location during the procedure and provide accurate positional data to biopsy a lesion. 3D imaging with GE OEC 3D C arm (3D CBCT and 2D images utilized)  Description of Procedure:  Robotic bronchoscopy: The patient was brought to Procedure Room 2 (Bronchoscopy Suite) in the OR area where appropriate  timeout was taken with the staff after the patient was inducted under general anesthesia.  The patient was inducted under general anesthesia and intubated by the anesthesia team.  Patient was intubated with a 8.5 ET tube without difficulty.  Tube was secured at 4 cm above the carina.  A Ion ET tube adapter was placed on the ET tube flange.  Once the patient was under adequate general anesthesia the Olympus therapeutic video bronchoscope was advanced and an anatomic airway tour and surveillance bronchoscopy was performed.The distal trachea appeared unremarkable. The main carina was sharp.  No secretions were seen in either right or left mainstem bronchi. The RUL, RLL, RML appeared to be free of endobronchial masses, lesions, or purulent secretions. Likewise, the LLL/LUL appeared to be free of endobronchial masses, lesions, or purulent secretions.  There were some scant benign appearing secretions on the left upper lobe that were lavaged and suctioned until cleared.  Once the survey bronchoscopy was completed, a spin with the 3D C arm was then performed under a breath-hold.  The target was then identified on the images obtained with the 3D C arm.  Data from this 3D C arm was then transposed to the Ion robotic platform.  At this point the bronchoscopic catheter was advanced into the airway.  No was identified and rubber orientation was noted with the software.  At this point registration was then performed.  There was good correlation between the robotic mapping and bronchoscopic mapping. With the assistance of robotic navigation, the bronchoscopic catheter was advanced to the LUL nodule/mass. The tip of the working channel sat within 9 mm of the nodule.  Positioning was confirmed with 3D imaging.  A spin was performed ensure proper target acquisition.  Utilizing information from the span the target was repositioned with the Ion platform.  At this point utilizing a 21-gauge Intuitive transbronchial needle, 4 passes  were performed of the LUL nodule/mass  under direct fluoroscopy.  Utilizing Olympus mini forceps and Cook mini forceps a total of 6 biopsies were obtained.  A BAL was also obtained at this segment, which sent for cytology analysis.  ROSE was nondiagnostic.  For BAL sample acquisition purposes, 40 ml of normal saline were instilled, and approximately 7 ml were recovered/trapped and sent for analysis.   At this point the robotic catheter was retrieved and the procedure was terminated. The patient tolerated the procedure well. No significant bleeding was observed at the conclusion of the procedure.  At this point, the patient was allowed to emerge from general anesthesia, and was extubated in the procedure room without incident.  The patient  was taken to the PACU in satisfactory condition.  Auscultation of the lungs showed no change from pre bronchoscopy examination.  Patient tolerated the procedure very well with no untoward effects of anesthesia noted.   Specimens Obtained:  Transbronchial Forceps Biopsy: X 6 from LUL  Transbronchial Needle: X 4 from LUL  Targeted BAL: 40 mL saline in, 7 mL aliquot from LUL  Fluoroscopy: 3D imaging was utilized during the course of this procedure to assure that biopsies were taken in a safe manner under fluoroscopic guidance with spot films required.  Total fluoroscopy/imaging time: 5 minutes 38 seconds, total dose 102 mGy.  Intraoperative images:  Fluoroscopic image of catheter in position (35 degree RAO)   CBCT image of bronchoscopic catheter with tip at the lesion   Sagittal view with the catheter and needle in the lesion    Complications:None, no pneumothorax on post film:   Estimated Blood Loss: Nil   Assessment and Plan/Additional Comments: Left upper lobe nodule, FDG avid, rule out cancer Status post technically successful robotic assisted navigational bronchoscopy Await pathology reports    C. Chloe Counter, MD Advanced  Bronchoscopy PCCM [facility]    *This note was dictated using voice recognition software/Dragon.  Despite best efforts to proofread, errors can occur which can change the meaning.  Any change was purely unintentional.

## 2023-05-26 NOTE — Transfer of Care (Signed)
 Immediate Anesthesia Transfer of Care Note  Patient: Maxwell Young  Procedure(s) Performed: BRONCHOSCOPY, WITH BIOPSY USING ELECTROMAGNETIC NAVIGATION (Left) BRONCHOSCOPY, WITH EBUS (Left)  Patient Location: PACU  Anesthesia Type:General  Level of Consciousness: awake, drowsy, and patient cooperative  Airway & Oxygen Therapy: Patient Spontanous Breathing and Patient connected to face mask oxygen  Post-op Assessment: Report given to RN and Post -op Vital signs reviewed and stable  Post vital signs: Reviewed and stable  Last Vitals:  Vitals Value Taken Time  BP    Temp    Pulse 66 05/26/23 1355  Resp 16 05/26/23 1354  SpO2 100 % 05/26/23 1355  Vitals shown include unfiled device data.  Last Pain:  Vitals:   05/26/23 1117  TempSrc: Temporal  PainSc: 0-No pain         Complications: No notable events documented.

## 2023-05-26 NOTE — Anesthesia Procedure Notes (Signed)
 Procedure Name: Intubation Date/Time: 05/26/2023 12:15 PM  Performed by: Niki Barter, CRNAPre-anesthesia Checklist: Patient identified, Emergency Drugs available, Suction available and Patient being monitored Patient Re-evaluated:Patient Re-evaluated prior to induction Oxygen Delivery Method: Circle system utilized Preoxygenation: Pre-oxygenation with 100% oxygen Induction Type: IV induction Ventilation: Mask ventilation without difficulty Laryngoscope Size: McGrath and 3 Grade View: Grade I Tube type: Oral Tube size: 8.5 mm Number of attempts: 1 Airway Equipment and Method: Stylet and Oral airway Placement Confirmation: ETT inserted through vocal cords under direct vision, positive ETCO2 and breath sounds checked- equal and bilateral Secured at: 21 cm Tube secured with: Tape Dental Injury: Teeth and Oropharynx as per pre-operative assessment

## 2023-05-26 NOTE — Interval H&P Note (Signed)
 Maxwell Young has presented today for surgery, with the diagnosis of LEFT UPPER LOBE LUNG NODULE.  The various methods of treatment have been discussed with the patient and family. After consideration of risks, benefits and other options for treatment, the patient has consented to  Procedure(s): ROBOTIC ASSISTED NAVIGATIONAL BRONCHOSCOPY- LEFT as a surgical intervention.  The patient's history has been reviewed, patient examined, no change in status, stable for surgery.  I have reviewed the patient's chart and labs.  Questions were answered to the patient's satisfaction.

## 2023-05-27 ENCOUNTER — Encounter: Payer: Self-pay | Admitting: Pulmonary Disease

## 2023-05-27 LAB — SURGICAL PATHOLOGY

## 2023-05-28 ENCOUNTER — Other Ambulatory Visit: Payer: Self-pay

## 2023-05-28 LAB — CYTOLOGY - NON PAP

## 2023-05-28 MED ORDER — METFORMIN HCL 500 MG PO TABS
1000.0000 mg | ORAL_TABLET | Freq: Two times a day (BID) | ORAL | 0 refills | Status: DC
Start: 2023-05-28 — End: 2023-05-29

## 2023-05-29 ENCOUNTER — Other Ambulatory Visit: Payer: Self-pay | Admitting: Family Medicine

## 2023-05-29 ENCOUNTER — Other Ambulatory Visit: Payer: Self-pay | Admitting: Pulmonary Disease

## 2023-05-29 DIAGNOSIS — R911 Solitary pulmonary nodule: Secondary | ICD-10-CM

## 2023-05-29 MED ORDER — AZITHROMYCIN 500 MG PO TABS: 500.0000 mg | ORAL_TABLET | Freq: Every day | ORAL | 0 refills | Status: AC

## 2023-05-29 NOTE — Progress Notes (Signed)
 Pathology of lung nodule FNA more consistent with inflammatory change.  Will treat with azithromycin  and follow-up CT chest in 3 months time.  Patient has been made aware of results.  Acey Ace, MD Advanced Bronchoscopy PCCM [facility]

## 2023-05-29 NOTE — Telephone Encounter (Signed)
 Copied from CRM [id number]. Topic: Clinical - Medication Refill >> May 29, 2023 10:55 AM [facility] T wrote: Medication: metFORMIN  (GLUCOPHAGE ) 500 MG tablet-   100 day supply  Has the patient contacted their pharmacy? Yes (Agent: If no, request that the patient contact the pharmacy for the refill. If patient does not wish to contact the pharmacy document the reason why and proceed with request.) (Agent: If yes, when and what did the pharmacy advise?)  This is the patient's preferred pharmacy:    [id number] - [place], [place] - [address] [place] [address] Phone: [phone] Fax: [phone]  Is this the correct pharmacy for this prescription? Yes If no, delete pharmacy and type the correct one.   Has the prescription been filled recently? Yes  Is the patient out of the medication? No  Has the patient been seen for an appointment in the last year OR does the patient have an upcoming appointment? Yes  Can we respond through MyChart? Yes  Agent: Please be advised that Rx refills may take up to 3 business days. We ask that you follow-up with your pharmacy.

## 2023-06-02 MED ORDER — METFORMIN HCL 500 MG PO TABS: 1000.0000 mg | ORAL_TABLET | Freq: Two times a day (BID) | ORAL | 0 refills | Status: DC

## 2023-06-02 NOTE — Telephone Encounter (Signed)
 [facility], last refill (05/28/23) sent to [facility].  Requested Prescriptions  Pending Prescriptions Disp Refills   metFORMIN  (GLUCOPHAGE ) 500 MG tablet 360 tablet 0    Sig: Take 2 tablets (1,000 mg total) by mouth 2 (two) times daily with a meal.     Endocrinology:  Diabetes - Biguanides Failed - 06/02/2023  8:36 AM      Failed - B12 Level in normal range and within 720 days    No results found for: "VITAMINB12"       Passed - Cr in normal range and within 360 days    Creatinine, Ser  Date Value Ref Range Status  03/31/2023 0.80 0.76 - 1.27 mg/dL Final         Passed - HBA1C is between 0 and 7.9 and within 180 days    HB A1C (BAYER DCA - WAIVED)  Date Value Ref Range Status  12/30/2022 9.6 (H) 4.8 - 5.6 % Final    Comment:             Prediabetes: 5.7 - 6.4          Diabetes: >6.4          Glycemic control for adults with diabetes: <7.0    Hgb A1c MFr Bld  Date Value Ref Range Status  03/31/2023 7.1 (H) 4.8 - 5.6 % Final    Comment:             Prediabetes: 5.7 - 6.4          Diabetes: >6.4          Glycemic control for adults with diabetes: <7.0          Passed - eGFR in normal range and within 360 days    GFR calc Af Amer  Date Value Ref Range Status  02/21/2020 89 >59 mL/min/1.73 Final    Comment:    **In accordance with recommendations from the NKF-ASN Task force,**   [facility] is in the process of updating its eGFR calculation to the   2021 CKD-EPI creatinine equation that estimates kidney function   without a race variable.    GFR, Estimated  Date Value Ref Range Status  01/03/2023 >60 >60 mL/min Final    Comment:    (NOTE) Calculated using the CKD-EPI Creatinine Equation (2021)    eGFR  Date Value Ref Range Status  03/31/2023 98 >59 mL/min/1.73 Final         Passed - Valid encounter within last 6 months    Recent Outpatient Visits           2 months ago Essential hypertension   [facility] [place], Megan P, DO              Passed - CBC within normal limits and completed in the last 12 months    WBC  Date Value Ref Range Status  03/31/2023 10.6 3.4 - 10.8 x10E3/uL Final  01/03/2023 8.4 4.0 - 10.5 K/uL Final   RBC  Date Value Ref Range Status  03/31/2023 4.76 4.14 - 5.80 x10E6/uL Final  01/03/2023 5.11 4.22 - 5.81 MIL/uL Final   Hemoglobin  Date Value Ref Range Status  03/31/2023 14.4 13.0 - 17.7 g/dL Final   Hematocrit  Date Value Ref Range Status  03/31/2023 44.4 37.5 - 51.0 % Final   MCHC  Date Value Ref Range Status  03/31/2023 32.4 31.5 - 35.7 g/dL Final  44/01/270 53.6 30.0 - 36.0 g/dL  Final   MCH  Date Value Ref Range Status  03/31/2023 30.3 26.6 - 33.0 pg Final  01/03/2023 31.1 26.0 - 34.0 pg Final   MCV  Date Value Ref Range Status  03/31/2023 93 79 - 97 fL Final   No results found for: "PLTCOUNTKUC", "LABPLAT", "POCPLA" RDW  Date Value Ref Range Status  03/31/2023 13.3 11.6 - 15.4 % Final

## 2023-06-06 ENCOUNTER — Ambulatory Visit: Payer: Self-pay | Admitting: Pulmonary Disease

## 2023-06-19 ENCOUNTER — Ambulatory Visit: Admitting: Pulmonary Disease

## 2023-06-19 ENCOUNTER — Encounter: Payer: Self-pay | Admitting: Pulmonary Disease

## 2023-06-19 VITALS — BP 100/60 | HR 69 | Ht 71.0 in | Wt 168.0 lb

## 2023-06-19 DIAGNOSIS — Z720 Tobacco use: Secondary | ICD-10-CM

## 2023-06-19 DIAGNOSIS — J439 Emphysema, unspecified: Secondary | ICD-10-CM | POA: Diagnosis not present

## 2023-06-19 DIAGNOSIS — Z87891 Personal history of nicotine dependence: Secondary | ICD-10-CM | POA: Diagnosis not present

## 2023-06-19 DIAGNOSIS — R911 Solitary pulmonary nodule: Secondary | ICD-10-CM

## 2023-06-19 NOTE — Patient Instructions (Signed)
 VISIT SUMMARY:  You came in today for a follow-up after your robotic-assisted navigational bronchoscopy. The procedure went smoothly without any complications, and you are currently stable. We discussed the findings and your current lung function, which is at 86%.  YOUR PLAN:  -LUNG NODULE: A lung nodule is a small growth in the lung that can be benign or malignant. Your recent bronchoscopy showed inflammatory cells, which suggests inflammation. We will do a follow-up scan in three months to monitor any changes in the nodule. After the scan, we will have another appointment to discuss the results and next steps.  -COPD WITH EMPHYSEMA: COPD with emphysema is a chronic lung condition that makes it hard to breathe. Your lung function is currently at 86%, which is relatively good. However, it is important to stop vaping to prevent further damage to your lungs.  INSTRUCTIONS:  Please schedule a follow-up scan in three months to reassess the lung nodule. After the scan, make an appointment to discuss the results and next steps.

## 2023-06-19 NOTE — Progress Notes (Signed)
 Subjective:    Patient ID: Maxwell Young, male    DOB: Dec 13, 1955, 68 y.o.   MRN: [id number]  Patient Care Team: Solomon Dupre, DO as PCP - General (Family Medicine) End, Veryl Gottron, MD as PCP - Cardiology (Cardiology)  Chief Complaint  Patient presents with   Follow-up    Breathing is doing well and no new co's.     BACKGROUND/INTERVAL: 68 year old former smoker with a 69 pack-year history of smoking, currently vaping, with a history as noted below who presents for follow-up of an abnormal lung cancer screening CT. he underwent robotic assisted navigational bronchoscopy on 26 May 2023.  Pathology consistent with inflammatory change.  HPI Discussed the use of AI scribe software for clinical note transcription with the patient, who gave verbal consent to proceed.  History of Present Illness   Maxwell Young is a 68 year old male with a lung nodule and COPD/emphysema who presents for follow-up after robotic assisted navigational bronchoscopy.  He underwent a robotic assisted navigational bronchoscopy for evaluation of a lung nodule. No complications were reported following the anesthesia, and he was stable during the procedure. Inflammatory cells were found. A follow-up scan is planned in three months to reassess the nodule's behavior.  He has a history of COPD/emphysema and is curious about his lung function. He reports no breathing problems.  We discussed PFTs performed on 26 April which showed that the patient has minimal obstructive airways disease and mild diffusion defect consistent with COPD stage I. He continues to vape.  He was counseled regards to discontinuation of vaping.  He completed a course of antibiotics that was prescribed to address potential inflammation related to the lung nodule. No issues were reported following the anesthesia.  Overall he feels well and looks well.     Review of Systems A 10 point review of systems was performed and it is as noted  above otherwise negative.   Patient Active Problem List   Diagnosis Date Noted   Nodule of left lung 05/26/2023   Non-recurrent bilateral inguinal hernia without obstruction or gangrene 01/07/2023   Abnormal EKG 11/28/2022   Ectatic abdominal aorta (HCC) 09/24/2022   Advance care planning 09/12/2022   Polyp of transverse colon    Abnormal PSA 08/31/2019   Benign prostatic hyperplasia with lower urinary tract symptoms 08/31/2019   Type 2 diabetes mellitus with other specified complication (HCC) 09/07/2018   COPD (chronic obstructive pulmonary disease) with emphysema (HCC) 08/26/2017   Aortic atherosclerosis (HCC) 02/24/2017   PAD (peripheral artery disease) (HCC) 02/24/2017   Personal history of tobacco use, presenting hazards to health 07/27/2015   History of colonic polyps    Benign neoplasm of transverse colon    Rectal polyp    Smoking greater than 40 pack years 06/13/2015   Essential hypertension 12/14/2014   CAD (coronary artery disease) 12/14/2014   GERD (gastroesophageal reflux disease) 12/14/2014   Hyperlipidemia associated with type 2 diabetes mellitus (HCC) 12/14/2014    Social History   Tobacco Use   Smoking status: Former    Current packs/day: 0.00    Average packs/day: 1.5 packs/day for 45.8 years (68.7 ttl pk-yrs)    Types: E-cigarettes, Cigarettes    Start date: 9    Quit date: 11/21/2021    Years since quitting: 1.6    Passive exposure: Past   Smokeless tobacco: Never   Tobacco comments:    Currently vapes.  Substance Use Topics   Alcohol use: No    No Known  Allergies  Current Meds  Medication Sig   amLODipine  (NORVASC ) 10 MG tablet Take 1 tablet (10 mg total) by mouth daily. (Patient taking differently: Take 10 mg by mouth every morning.)   aspirin EC 81 MG tablet Take 81 mg by mouth daily. Swallow whole.   Blood Glucose Monitoring Suppl (CONTOUR NEXT ONE) KIT 1 each by Does not apply route in the morning and at bedtime.   glucose blood test  strip USE DAILY TO CHECK BLOOD SUGAR.   Lancets (ONETOUCH ULTRASOFT) lancets USE DAILY TO CHECK BLOOD SUGAR.   metFORMIN  (GLUCOPHAGE ) 500 MG tablet Take 2 tablets (1,000 mg total) by mouth 2 (two) times daily with a meal.   metoprolol  succinate (TOPROL -XL) 100 MG 24 hr tablet Take 1 tablet (100 mg total) by mouth daily. Take with or immediately following a meal. (Patient taking differently: Take 100 mg by mouth every morning. Take with or immediately following a meal.)   Multiple Vitamins-Minerals (MULTIVITAMIN ADULT PO) Take 1 tablet by mouth daily.   omega-3 acid ethyl esters (LOVAZA ) 1 g capsule TAKE 2 CAPSULES TWICE A DAY (Patient taking differently: Take 1 g by mouth 2 (two) times daily. TAKE 2 CAPSULES TWICE A DAY)   omeprazole  (PRILOSEC) 20 MG capsule Take 1 capsule (20 mg total) by mouth every other day.   rosuvastatin  (CRESTOR ) 40 MG tablet Take 1 tablet (40 mg total) by mouth daily. (Patient taking differently: Take 40 mg by mouth every morning.)   tamsulosin  (FLOMAX ) 0.4 MG CAPS capsule Take 2 capsules (0.8 mg total) by mouth daily. (Patient taking differently: Take 0.8 mg by mouth daily after breakfast.)    Immunization History  Administered Date(s) Administered   Fluad Trivalent(High Dose 65+) 12/31/2022   Influenza,inj,Quad PF,6+ Mos 12/14/2014, 01/01/2016, 11/23/2020, 12/03/2021   PFIZER(Purple Top)SARS-COV-2 Vaccination 09/29/2019, 10/27/2019   PNEUMOCOCCAL CONJUGATE-20 06/11/2022   Pneumococcal Polysaccharide-23 08/23/2020   Pneumococcal-Unspecified 04/25/2011   Td 11/23/2020   Tdap 10/04/2010   Zoster Recombinant(Shingrix ) 12/03/2021, 03/05/2022        Objective:     BP 100/60   Pulse 69   Ht 5\' 11"  (1.803 m)   Wt 168 lb (76.2 kg)   SpO2 97%   BMI 23.43 kg/m   SpO2: 97 % O2 Device: None (Room air)  GENERAL: Well-developed, well-nourished gentleman, no acute distress.  Fully ambulatory, no conversational dyspnea. HEAD: Normocephalic, atraumatic.  EYES:  Pupils equal, round, reactive to light.  No scleral icterus.  MOUTH: Dentures both.  Oral mucosa moist.  No thrush.  Mallampati II airway NECK: Supple. No thyromegaly. Trachea midline. No JVD.  No adenopathy. PULMONARY: Good air entry bilaterally.  No adventitious sounds. CARDIOVASCULAR: S1 and S2. Regular rate and rhythm.  No rubs, murmurs or gallops heard. ABDOMEN: Benign. MUSCULOSKELETAL: No joint deformity, no clubbing, no edema.  NEUROLOGIC: No overt focal deficit, no gait disturbance, speech is fluent. SKIN: Intact,warm,dry. PSYCH: Mood and behavior normal.       Assessment & Plan:     ICD-10-CM   1. Nodule of left lung - 12mm  R91.1 CT CHEST WO CONTRAST    2. Pulmonary emphysema, unspecified emphysema type (HCC)  J43.9     3. Personal history of tobacco use, presenting hazards to health  Z87.891     4. Vapes nicotine containing substance  Z72.0       Orders Placed This Encounter  Procedures   CT CHEST WO CONTRAST    Standing Status:   Future    Expected Date:  08/25/2023    Expiration Date:   06/18/2024    Preferred imaging location?:   [facility]   Discussion:    Lung nodule Status post robotic-assisted navigational bronchoscopy revealing inflammatory cells, indicating possible inflammation. The nodule's nature remains under evaluation. - Order follow-up scan in three months to assess changes in the lung nodule. - Schedule follow-up appointment post-scan to discuss next steps.  COPD with emphysema Mild to moderate COPD with emphysema (stage 1 to 2). Lung function is at 86% with a mild diffusion defect due to emphysema. - Advise cessation of vaping.      Advised if symptoms do not improve or worsen, to please contact office for sooner follow up or seek emergency care.    I spent 31 minutes of dedicated to the care of this patient on the date of this encounter to include pre-visit review of records, face-to-face time with the patient discussing conditions  above, post visit ordering of testing, clinical documentation with the electronic health record, making appropriate referrals as documented, and communicating necessary findings to members of the patients care team.     C. Chloe Counter, MD Advanced Bronchoscopy PCCM [facility]    *This note was generated using voice recognition software/Dragon and/or AI transcription program.  Despite best efforts to proofread, errors can occur which can change the meaning. Any transcriptional errors that result from this process are unintentional and may not be fully corrected at the time of dictation.

## 2023-07-07 ENCOUNTER — Encounter: Payer: Self-pay | Admitting: Family Medicine

## 2023-07-07 ENCOUNTER — Ambulatory Visit: Admitting: Family Medicine

## 2023-07-07 VITALS — BP 107/67 | HR 66 | Temp 98.0°F | Ht 72.0 in

## 2023-07-07 DIAGNOSIS — I10 Essential (primary) hypertension: Secondary | ICD-10-CM

## 2023-07-07 DIAGNOSIS — E1169 Type 2 diabetes mellitus with other specified complication: Secondary | ICD-10-CM

## 2023-07-07 LAB — BAYER DCA HB A1C WAIVED: HB A1C (BAYER DCA - WAIVED): 6.3 % — ABNORMAL HIGH (ref 4.8–5.6)

## 2023-07-07 MED ORDER — OMEPRAZOLE 20 MG PO CPDR: 20.0000 mg | DELAYED_RELEASE_CAPSULE | Freq: Every day | ORAL | 1 refills | Status: DC

## 2023-07-07 MED ORDER — AMLODIPINE BESYLATE 10 MG PO TABS: 10.0000 mg | ORAL_TABLET | Freq: Every day | ORAL | 1 refills | Status: DC

## 2023-07-07 MED ORDER — METFORMIN HCL 500 MG PO TABS: 1000.0000 mg | ORAL_TABLET | Freq: Two times a day (BID) | ORAL | 1 refills | Status: DC

## 2023-07-07 MED ORDER — TAMSULOSIN HCL 0.4 MG PO CAPS: 0.8000 mg | ORAL_CAPSULE | Freq: Every day | ORAL | 1 refills | Status: DC

## 2023-07-07 MED ORDER — METOPROLOL SUCCINATE ER 100 MG PO TB24: 100.0000 mg | ORAL_TABLET | Freq: Every day | ORAL | 1 refills | Status: DC

## 2023-07-07 MED ORDER — ROSUVASTATIN CALCIUM 40 MG PO TABS: 40.0000 mg | ORAL_TABLET | Freq: Every day | ORAL | 1 refills | Status: DC

## 2023-07-07 NOTE — Progress Notes (Signed)
 BP 107/67 (BP Location: Left Arm, Patient Position: Sitting, Cuff Size: Normal)   Pulse 66   Temp 98 F (36.7 C) (Oral)   Ht 6' (1.829 m)   SpO2 98%   BMI 22.78 kg/m    Subjective:    Patient ID: Maxwell Young, male    DOB: 11-23-1955, 68 y.o.   MRN: [id number]  HPI: Maxwell Young is a 68 y.o. male  Chief Complaint  Patient presents with   Diabetes   Heartburn   DIABETES Hypoglycemic episodes:no Polydipsia/polyuria: no Visual disturbance: no Chest pain: no Paresthesias: no Glucose Monitoring: no  Accucheck frequency: Not Checking Taking Insulin ?: no Blood Pressure Monitoring: not checking Retinal Examination: Up to Date Foot Exam: Up to Date Diabetic Education: Completed Pneumovax: Up to Date Influenza: Up to Date Aspirin: yes  Relevant past medical, surgical, family and social history reviewed and updated as indicated. Interim medical history since our last visit reviewed. Allergies and medications reviewed and updated.  Review of Systems  Constitutional: Negative.   Respiratory: Negative.    Cardiovascular: Negative.   Musculoskeletal: Negative.   Psychiatric/Behavioral: Negative.      Per HPI unless specifically indicated above     Objective:    BP 107/67 (BP Location: Left Arm, Patient Position: Sitting, Cuff Size: Normal)   Pulse 66   Temp 98 F (36.7 C) (Oral)   Ht 6' (1.829 m)   SpO2 98%   BMI 22.78 kg/m   Wt Readings from Last 3 Encounters:  06/19/23 168 lb (76.2 kg)  05/26/23 169 lb 15.6 oz (77.1 kg)  05/14/23 170 lb (77.1 kg)    Physical Exam Vitals and nursing note reviewed.  Constitutional:      General: He is not in acute distress.    Appearance: Normal appearance. He is not ill-appearing, toxic-appearing or diaphoretic.  HENT:     Head: Normocephalic and atraumatic.     Right Ear: External ear normal.     Left Ear: External ear normal.     Nose: Nose normal.     Mouth/Throat:     Mouth: Mucous membranes are moist.      Pharynx: Oropharynx is clear.   Eyes:     General: No scleral icterus.       Right eye: No discharge.        Left eye: No discharge.     Extraocular Movements: Extraocular movements intact.     Conjunctiva/sclera: Conjunctivae normal.     Pupils: Pupils are equal, round, and reactive to light.    Cardiovascular:     Rate and Rhythm: Normal rate and regular rhythm.     Pulses: Normal pulses.     Heart sounds: Normal heart sounds. No murmur heard.    No friction rub. No gallop.  Pulmonary:     Effort: Pulmonary effort is normal. No respiratory distress.     Breath sounds: Normal breath sounds. No stridor. No wheezing, rhonchi or rales.  Chest:     Chest wall: No tenderness.   Musculoskeletal:        General: Normal range of motion.     Cervical back: Normal range of motion and neck supple.   Skin:    General: Skin is warm and dry.     Capillary Refill: Capillary refill takes less than 2 seconds.     Coloration: Skin is not jaundiced or pale.     Findings: No bruising, erythema, lesion or rash.   Neurological:  General: No focal deficit present.     Mental Status: He is alert and oriented to person, place, and time. Mental status is at baseline.   Psychiatric:        Mood and Affect: Mood normal.        Behavior: Behavior normal.        Thought Content: Thought content normal.        Judgment: Judgment normal.     Results for orders placed or performed during the hospital encounter of 05/26/23  Cytology - Non PAP; LUL   Collection Time: 05/26/23 12:00 AM  Result Value Ref Range   CYTOLOGY - NON GYN      CYTOLOGY - NON PAP [facility] [address], [address] [place], [place] [address] Telephone [phone] or [phone] Fax [phone]  CYTOPATHOLOGY REPORT   Accession #: [id number] Patient Name: DENNEY, SHEIN Visit # : [id number]  MRN: [id number] Physician: Coralie Derrick DOB/Age May 22, 1955 (Age: 45) Gender: M Collected  Date: 05/26/2023 Received Date: 05/26/2023  FINAL DIAGNOSIS STATEMENT Of SPECIMEN ADEQUACY:  INTERPRETATION(S):      -BENIGN BRONCHIAL EPITHELIAL CELLS, HISTIOCYTES AND SCATTERED MIXED INFLAMMATORY      CELLS.      -BENIGN BRONCHIAL EPITHELIAL CELLS AND HISTIOCYTES.      ELECTRONIC SIGNATURE : [place] Md, Merchant navy officer, Sports administrator, International aid/development worker   CASE COMMENTS   CLINICAL HISTORY  SOURCE OF SPECIMEN(S) Fine Needle Aspiration Bronchial Lavage  SPECIMEN COMMENTS: SPECIMEN CLINICAL INFORMATION: 1. 68 YO smoker with LUL nodule.R/O CA    Gross Description Specimen: Received is/are 35cc of clear fluid  in cytolyt and four slides      Prepared:      # Smears: 4      # Concentration Technique Slides (i.e. ThinPrep): 1      # Cell Block: QNS-Remaining specimen quantity not sufficient to prepare cell      block      # Diff-Quick Stain: 0      SEE CONCURRENT CASE [id number] Specimen: Received is/are 30cc of clear fluid in cytolyt      Prepared:      # Smears: 0      # Concentration Technique Slides (i.e. ThinPrep): 1      # Cell Block: QNS-Remaining specimen quantity not sufficient to prepare cell      block      # Diff-Quick Stain: 0      SEE CONCURRENT CASE [id number]        Report signed out from the following location(s) [place] [address], [place] [address] CLIA #: [id number]  [facility] [address] [place], [place] [address] CLIA #: [id number]   Surgical pathology   Collection Time: 05/26/23 12:00 AM  Result Value Ref Range   SURGICAL PATHOLOGY      SURGICAL PATHOLOGY [facility] [address] [place], [place] [address] Telephone [phone] or [phone] Fax [phone]  REPORT OF SURGICAL PATHOLOGY   Accession #: [id number] Patient Name: BENJERMIN, KORBER Visit # : [id number]  MRN: [id number] Physician: Coralie Derrick DOB/Age [birth year] (Age:  50) Gender: M Collected Date: 05/26/2023 Received Date: 05/26/2023  FINAL DIAGNOSIS       1. Lung, transbronchial biopsy, LUL :       - SMALL FRAGMENT OF BENIGN ALVEOLAR TISSUE WITH FOCAL CHRONIC INFLAMMATION.      - FIBRIN, RED BLOOD CELLS AND SCATTERED INFLAMMATORY CELLS.      -  NEGATIVE FOR MALIGNANCY.       DATE SIGNED OUT: 05/27/2023 ELECTRONIC SIGNATURE : [place] Md, Mark, Pathologist, Electronic Signature  MICROSCOPIC DESCRIPTION  CASE COMMENTS STAINS USED IN DIAGNOSIS: H&E H&E    CLINICAL HISTORY  SPECIMEN(S) OBTAINED 1. Lung, transbronchial biopsy, LUL  SPECIMEN COMMENTS: SPECIMEN CLINICAL INFORMATION:  1. 68 YO smoker with LUL nodule.  R/O CA    Gross Description 1. LUL, received in formalin are 2 pink-red, soft tissue fragments that are 0.1 x 0.1 x 0.1 cm and 0.2 x 0.1 x 0.1 cm. The specimen is submitted in toto in 2 blocks (1A-B).      AMG 05/26/2023        Report signed out from the following location(s) [place] [address], [place] [address] CLIA #: [id number]  [facility] [address] [place], [place] [address] CLIA #: [id number]   Glucose, capillary   Collection Time: 05/26/23 11:13 AM  Result Value Ref Range   Glucose-Capillary 170 (H) 70 - 99 mg/dL  Glucose, capillary   Collection Time: 05/26/23  2:07 PM  Result Value Ref Range   Glucose-Capillary 194 (H) 70 - 99 mg/dL      Assessment & Plan:   Problem List Items Addressed This Visit       Cardiovascular and Mediastinum   Essential hypertension   Relevant Medications   amLODipine  (NORVASC ) 10 MG tablet   rosuvastatin  (CRESTOR ) 40 MG tablet   metoprolol  succinate (TOPROL -XL) 100 MG 24 hr tablet     Endocrine   Type 2 diabetes mellitus with other specified complication (HCC) - Primary   Doing great with A1c of 6.3. Continue current regimen. Continue to monitor. Call with any concerns.       Relevant Medications    metFORMIN  (GLUCOPHAGE ) 500 MG tablet   rosuvastatin  (CRESTOR ) 40 MG tablet   Other Relevant Orders   Bayer DCA Hb A1c Waived     Follow up plan: Return in about 3 months (around 10/07/2023) for physical/AWV.

## 2023-07-07 NOTE — Assessment & Plan Note (Signed)
Doing great with A1c of 6.3. Continue current regimen. Continue to monitor. Call with any concerns.

## 2023-08-21 ENCOUNTER — Ambulatory Visit: Attending: Cardiology | Admitting: Cardiology

## 2023-08-21 ENCOUNTER — Encounter: Payer: Self-pay | Admitting: Cardiology

## 2023-08-21 VITALS — BP 100/60 | HR 59 | Ht 72.0 in | Wt 165.6 lb

## 2023-08-21 DIAGNOSIS — E785 Hyperlipidemia, unspecified: Secondary | ICD-10-CM

## 2023-08-21 DIAGNOSIS — E1169 Type 2 diabetes mellitus with other specified complication: Secondary | ICD-10-CM

## 2023-08-21 DIAGNOSIS — I2510 Atherosclerotic heart disease of native coronary artery without angina pectoris: Secondary | ICD-10-CM

## 2023-08-21 DIAGNOSIS — I10 Essential (primary) hypertension: Secondary | ICD-10-CM | POA: Diagnosis not present

## 2023-08-21 DIAGNOSIS — R911 Solitary pulmonary nodule: Secondary | ICD-10-CM

## 2023-08-21 NOTE — Patient Instructions (Signed)
 Medication Instructions:  Your physician recommends that you continue on your current medications as directed. Please refer to the Current Medication list given to you today.   *If you need a refill on your cardiac medications before your next appointment, please call your pharmacy*  Lab Work: No labs ordered today  If you have labs (blood work) drawn today and your tests are completely normal, you will receive your results only by: MyChart Message (if you have MyChart) OR A paper copy in the mail If you have any lab test that is abnormal or we need to change your treatment, we will call you to review the results.  Testing/Procedures: No test ordered today   Follow-Up: At [facility], you and your health needs are our priority.  As part of our continuing mission to provide you with exceptional heart care, our providers are all part of one team.  This team includes your primary Cardiologist (physician) and Advanced Practice Providers or APPs (Physician Assistants and Nurse Practitioners) who all work together to provide you with the care you need, when you need it.  Your next appointment:   6 month(s)  Provider:   Lonni Hanson, MD

## 2023-08-21 NOTE — Progress Notes (Signed)
 Cardiology Office Note   Date:  08/21/2023  ID:  Maxwell Young, DOB 1955/03/28, MRN [id number] PCP: Maxwell Duwaine SQUIBB, DO  [facility] Providers Cardiologist:  Lonni Hanson, MD     History of Present Illness Maxwell Young is a 68 y.o. male with a past medical history of coronary artery disease status post CABG (11/11/2006 at [facility]), hyperlipidemia, hypertension,, type 2 diabetes, GERD, who is here today to follow-up on his coronary artery disease.   Patient stated that leading up to his CABG in 2008 he was often tired and did not feel normal.  His PCP ordered a stress test which ultimately led to cardiac catheterization and CABG.  Post CABG he follows with Dr. Nelida in [facility] cardiology but had not seen cardiology in several years.  He did not have any symptoms reminiscent of what he experienced before his CABG..  He had followed up with his primary care provider and was found to have an ongoing management of his CAD and there was concern for an abnormal EKG he was sent to follow-up with cardiology.  He was seen in clinic 11/27/2022 by Dr. Hanson.  He had been denying chest pain, shortness of breath, lightheadedness or peripheral edema.  He occasionally noticed that he was having palpitations when he gets up in the night to the bathroom though it is typically very brief and without associated symptoms.  He also noted some soreness and fatigue to his legs when walking extended distances approximately greater than 1 mile.  They also reviewed prior ABIs he had completed in March 2019 that were normal bilaterally his TBI's were abnormal.  His aspirin was decreased to 325 mg daily to 81 mg daily and for his abnormal EKG he had bifascicular block with a right bundle branch block and a left posterior fascicular block without high-grade AV block being noted.  He was scheduled for an echocardiogram to evaluate underlying structural abnormalities.    He was last seen in clinic 02/20/2003 stating  overall the cardiology team is doing well.  Had recently undergone hernia surgery back in December was progressing well since that time.  There were no changes made to his current medication regimen and further testing that was ordered.  He returns clinic today stating that he has been doing well from a cardiac perspective.  He denies any chest pain, shortness of breath, dyspnea exertion or peripheral edema.  States that he has been compliant with his current medication regimen without any undue side effects.  States that he continues to undergo lung cancer screening CTs and on his last screening was found to have been a lung nodule.  He underwent a biopsy that resulted as benign neck.  He has an updated chest CT that has been scheduled and follow-up with pulmonary.  Denies any hospitalizations or visits to the emergency department.  ROS: 10 point review of systems has been reviewed and considered negative the exception was been listed in the HPI  Studies Reviewed EKG Interpretation Date/Time:  Thursday August 21 2023 15:31:56 EDT Ventricular Rate:  59 PR Interval:  166 QRS Duration:  126 QT Interval:  396 QTC Calculation: 392 R Axis:   233  Text Interpretation: Sinus bradycardia Right bundle branch block Inferior infarct , age undetermined When compared with ECG of 27-Nov-2022 15:05, No significant change since last tracing Confirmed by Maxwell Young ([id number]) on 08/21/2023 3:52:15 PM    2D echo 12/11/2022 1. Left ventricular ejection fraction, by estimation, is 60 to 65%.  The  left ventricle has normal function. The left ventricle has no regional  wall motion abnormalities. Left ventricular diastolic parameters are  consistent with Grade I diastolic  dysfunction (impaired relaxation).   2. Right ventricular systolic function is normal. The right ventricular  size is normal. Tricuspid regurgitation signal is inadequate for assessing  PA pressure.   3. The mitral valve is normal in structure.  Mild mitral valve  regurgitation. No evidence of mitral stenosis.   4. Tricuspid valve regurgitation is mild to moderate.   5. The aortic valve has an indeterminant number of cusps. There is mild  calcification of the aortic valve. Aortic valve regurgitation is not  visualized. Aortic valve sclerosis/calcification is present, without any  evidence of aortic stenosis. Aortic  valve mean gradient measures 4.0 mmHg.   6. The inferior vena cava is normal in size with greater than 50%  respiratory variability, suggesting right atrial pressure of 3 mmHg.    Risk Assessment/Calculations           Physical Exam VS:  BP 100/60 (BP Location: Left Arm, Patient Position: Sitting, Cuff Size: Normal)   Pulse (!) 59   Ht 6' (1.829 m)   Wt 165 lb 9.6 oz (75.1 kg)   SpO2 98%   BMI 22.46 kg/m        Wt Readings from Last 3 Encounters:  08/21/23 165 lb 9.6 oz (75.1 kg)  06/19/23 168 lb (76.2 kg)  05/26/23 169 lb 15.6 oz (77.1 kg)    GEN: Well nourished, well developed in no acute distress NECK: No JVD; No carotid bruits CARDIAC: RRR, no murmurs, rubs, gallops RESPIRATORY:  Clear to auscultation without rales, wheezing or rhonchi  ABDOMEN: Soft, non-tender, non-distended EXTREMITIES:  No edema; No deformity   ASSESSMENT AND PLAN Coronary artery disease status post three-vessel CABG in 2008.  He continues to struggle anginal or anginal equivalents.  EKG today reveals sinus bradycardia, right bundle branch block, old inferior infarct and evolving left posterior fascicular block with no ischemic changes noted.  He is continued on aspirin 81 mg daily and rosuvastatin  40 mg daily as well as Lovaza  1 g 2 capsules twice daily.  No further ischemic workup is needed at this time.  Primary hypertension with blood pressure 100/60.  Blood pressure has been well-controlled.  He is continued on amlodipine  10 mg daily and Toprol -XL 100 mg daily.  He has been encouraged to continue to monitor his pressures 1 to  2 hours postmedication administration at home as well.  Hyperlipidemia associated with type 2 diabetes with his last hemoglobin A1c was 6.3 and LDL 25, he is continued on rosuvastatin  40 mg and Lovaza  1 g 2 capsules twice daily.  He is continued on metformin  with ongoing management of his diabetes by his PCP.  Pulmonary nodule found on lung screening CT.  He underwent a biopsy which was stated results came back as benign.  He has an upcoming repeat chest CT.  Ongoing management per pulmonary.       Dispo: Patient return to clinic to see MD/APP in 6 to 8 months or sooner if needed for further evaluation  Signed, Daison Braxton, NP

## 2023-08-25 DIAGNOSIS — I10 Essential (primary) hypertension: Secondary | ICD-10-CM | POA: Diagnosis not present

## 2023-08-25 DIAGNOSIS — E785 Hyperlipidemia, unspecified: Secondary | ICD-10-CM | POA: Diagnosis not present

## 2023-08-25 DIAGNOSIS — Z008 Encounter for other general examination: Secondary | ICD-10-CM | POA: Diagnosis not present

## 2023-08-25 DIAGNOSIS — E1169 Type 2 diabetes mellitus with other specified complication: Secondary | ICD-10-CM | POA: Diagnosis not present

## 2023-08-25 DIAGNOSIS — N400 Enlarged prostate without lower urinary tract symptoms: Secondary | ICD-10-CM | POA: Diagnosis not present

## 2023-08-25 DIAGNOSIS — F17211 Nicotine dependence, cigarettes, in remission: Secondary | ICD-10-CM | POA: Diagnosis not present

## 2023-08-26 ENCOUNTER — Ambulatory Visit
Admission: RE | Admit: 2023-08-26 | Discharge: 2023-08-26 | Disposition: A | Discharge status: Home / Self Care | Source: Ambulatory Visit | Attending: Pulmonary Disease | Admitting: Pulmonary Disease

## 2023-08-26 DIAGNOSIS — R911 Solitary pulmonary nodule: Secondary | ICD-10-CM | POA: Insufficient documentation

## 2023-08-29 ENCOUNTER — Ambulatory Visit: Admission: RE | Admit: 2023-08-29 | Source: Ambulatory Visit

## 2023-09-09 ENCOUNTER — Ambulatory Visit: Payer: Self-pay | Admitting: Pulmonary Disease

## 2023-09-16 ENCOUNTER — Telehealth: Payer: Self-pay

## 2023-09-16 ENCOUNTER — Ambulatory Visit (INDEPENDENT_AMBULATORY_CARE_PROVIDER_SITE_OTHER): Admitting: Pulmonary Disease

## 2023-09-16 ENCOUNTER — Encounter: Payer: Self-pay | Admitting: Pulmonary Disease

## 2023-09-16 VITALS — BP 120/70 | HR 62 | Temp 98.1°F | Ht 72.0 in | Wt 163.6 lb

## 2023-09-16 DIAGNOSIS — J439 Emphysema, unspecified: Secondary | ICD-10-CM | POA: Diagnosis not present

## 2023-09-16 DIAGNOSIS — R911 Solitary pulmonary nodule: Secondary | ICD-10-CM | POA: Diagnosis not present

## 2023-09-16 DIAGNOSIS — Z87891 Personal history of nicotine dependence: Secondary | ICD-10-CM

## 2023-09-16 DIAGNOSIS — Z720 Tobacco use: Secondary | ICD-10-CM

## 2023-09-16 NOTE — Telephone Encounter (Signed)
 Robotic Bronchoscopy with EBUS 09/29/2023 at 9:15am Lung Nodule [id number], [id number], [id number]  Maxwell Young please see Bronch info.  Patient is aware of date and time. Bronch email has been sent.

## 2023-09-16 NOTE — Progress Notes (Signed)
 Subjective:    Patient ID: Maxwell Young, male    DOB: [birth year], 68 y.o.   MRN: [id number]  Patient Care Team: Vicci Duwaine SQUIBB, DO as PCP - General (Family Medicine) End, Lonni, MD as PCP - Cardiology (Cardiology)  Chief Complaint  Patient presents with   Lung Mass    BACKGROUND/INTERVAL:68 year old former smoker with a 17 pack-year history of smoking, currently vaping, with a history as noted below who presents for follow-up of an abnormal lung cancer screening CT. he underwent robotic assisted navigational bronchoscopy on 26 May 2023.  Pathology consistent with inflammatory change.  Patient was last seen on 19 Jun 2023, had chest CT performed 26 August 2023.  He presents for follow-up.  HPI Discussed the use of AI scribe software for clinical note transcription with the patient, who gave verbal consent to proceed.  History of Present Illness   Maxwell Young is a 68 year old male with a lung nodule who presents for follow-up evaluation.  He is being monitored for a lung nodule that has demonstrated growth since the last evaluation. The nodule is characterized as a mixed or subsolid nodule with both solid and cloud-like components. A previous biopsy was conducted, but due to the nodule's structure, it may not have provided a complete assessment, necessitating a second biopsy.  He has a history of emphysema, confirmed through prior imaging studies. He denies experiencing shortness of breath.  He continues to vape, which is relevant to his pulmonary health and the management of his lung nodule.     Review of Systems A 10 point review of systems was performed and it is as noted above otherwise negative.   Patient Active Problem List   Diagnosis Date Noted   Nodule of left lung 05/26/2023   Non-recurrent bilateral inguinal hernia without obstruction or gangrene 01/07/2023   Abnormal EKG 11/28/2022   Ectatic abdominal aorta (HCC) 09/24/2022   Advance care planning  09/12/2022   Polyp of transverse colon    Abnormal PSA 08/31/2019   Benign prostatic hyperplasia with lower urinary tract symptoms 08/31/2019   Type 2 diabetes mellitus with other specified complication (HCC) 09/07/2018   COPD (chronic obstructive pulmonary disease) with emphysema (HCC) 08/26/2017   Aortic atherosclerosis (HCC) 02/24/2017   PAD (peripheral artery disease) (HCC) 02/24/2017   Personal history of tobacco use, presenting hazards to health 07/27/2015   History of colonic polyps    Benign neoplasm of transverse colon    Rectal polyp    Smoking greater than 40 pack years 06/13/2015   Essential hypertension 12/14/2014   CAD (coronary artery disease) 12/14/2014   GERD (gastroesophageal reflux disease) 12/14/2014   Hyperlipidemia associated with type 2 diabetes mellitus (HCC) 12/14/2014    Social History   Tobacco Use   Smoking status: Former    Current packs/day: 0.00    Average packs/day: 1.5 packs/day for 45.8 years (68.7 ttl pk-yrs)    Types: E-cigarettes, Cigarettes    Start date: 55    Quit date: 11/21/2021    Years since quitting: 1.8    Passive exposure: Past   Smokeless tobacco: Never   Tobacco comments:    Currently vapes.  Substance Use Topics   Alcohol use: No    No Known Allergies  Current Meds  Medication Sig   amLODipine  (NORVASC ) 10 MG tablet Take 1 tablet (10 mg total) by mouth daily.   aspirin EC 81 MG tablet Take 81 mg by mouth daily. Swallow whole.   Blood Glucose  Monitoring Suppl (CONTOUR NEXT ONE) KIT 1 each by Does not apply route in the morning and at bedtime.   glucose blood test strip USE DAILY TO CHECK BLOOD SUGAR.   Lancets (ONETOUCH ULTRASOFT) lancets USE DAILY TO CHECK BLOOD SUGAR.   metFORMIN  (GLUCOPHAGE ) 500 MG tablet Take 2 tablets (1,000 mg total) by mouth 2 (two) times daily with a meal.   metoprolol  succinate (TOPROL -XL) 100 MG 24 hr tablet Take 1 tablet (100 mg total) by mouth daily. Take with or immediately following a meal.    Multiple Vitamins-Minerals (MULTIVITAMIN ADULT PO) Take 1 tablet by mouth daily.   omega-3 acid ethyl esters (LOVAZA ) 1 g capsule TAKE 2 CAPSULES TWICE A DAY   omeprazole  (PRILOSEC) 20 MG capsule Take 1 capsule (20 mg total) by mouth daily.   rosuvastatin  (CRESTOR ) 40 MG tablet Take 1 tablet (40 mg total) by mouth daily.   tamsulosin  (FLOMAX ) 0.4 MG CAPS capsule Take 2 capsules (0.8 mg total) by mouth daily.    Immunization History  Administered Date(s) Administered   Fluad Trivalent(High Dose 65+) 12/31/2022   Influenza,inj,Quad PF,6+ Mos 12/14/2014, 01/01/2016, 11/23/2020, 12/03/2021   PFIZER(Purple Top)SARS-COV-2 Vaccination 09/29/2019, 10/27/2019   PNEUMOCOCCAL CONJUGATE-20 06/11/2022   Pneumococcal Polysaccharide-23 08/23/2020   Pneumococcal-Unspecified 04/25/2011   Td 11/23/2020   Tdap 10/04/2010   Zoster Recombinant(Shingrix ) 12/03/2021, 03/05/2022        Objective:   BP 120/70   Pulse 62   Temp 98.1 F (36.7 C) (Oral)   Ht 6' (1.829 m)   Wt 163 lb 9.6 oz (74.2 kg)   SpO2 97%   BMI 22.19 kg/m   SpO2: 97 %  GENERAL: Well-developed, well-nourished gentleman, no acute distress.  Fully ambulatory, no conversational dyspnea. HEAD: Normocephalic, atraumatic.  EYES: Pupils equal, round, reactive to light.  No scleral icterus.  MOUTH: Dentures both.  Oral mucosa moist.  No thrush.  Mallampati II airway NECK: Supple. No thyromegaly. Trachea midline. No JVD.  No adenopathy. PULMONARY: Good air entry bilaterally.  No adventitious sounds. CARDIOVASCULAR: S1 and S2. Regular rate and rhythm.  No rubs, murmurs or gallops heard. ABDOMEN: Benign. MUSCULOSKELETAL: No joint deformity, no clubbing, no edema.  NEUROLOGIC: No overt focal deficit, no gait disturbance, speech is fluent. SKIN: Intact,warm,dry. PSYCH: Mood and behavior normal.  Representative images from CT scan performed 26 August 2023 showing the 2 lesions in question in the left upper lobe (arrows):        Assessment & Plan:     ICD-10-CM   1. Nodule of left lung - 18 mm  R91.1 CT SUPER D CHEST WO MONARCH PILOT    2. Pulmonary emphysema, unspecified emphysema type (HCC)  J43.9     3. Personal history of tobacco use, presenting hazards to health  Z87.891     4. Vapes nicotine containing substance  Z72.0       Orders Placed This Encounter  Procedures   CT SUPER D CHEST WO MONARCH PILOT    Standing Status:   Future    Number of Occurrences:   1    Expiration Date:   09/15/2024    Scheduling Instructions:     Please do before 29 September 2023.  Patient has robotic assisted navigational bronchoscopy on 8 September. Patient can do 5  Sept (Friday)    Preferred imaging location?:   [facility]   Discussion:    Right lung subsolid nodule with interval growth The right lung subsolid nodule has shown interval growth, indicating potential progression. The  nodule is mixed with both solid and subsolid components. The solid component is more likely to provide diagnostic information. A previous biopsy may have been inconclusive due to surrounding cloud-like inflammation, necessitating a repeat biopsy for a definitive diagnosis. - Schedule robotic assisted navigational bronchoscopy for biopsy on September 8th. - Order a follow-up scan to be performed on the Friday before the biopsy to assist in tracking the nodule.  Emphysema Emphysema is present without symptoms of shortness of breath. Lung sounds are clear.  Nicotine dependence (vaping) Continues to vape, indicating ongoing nicotine dependence.     Advised if symptoms do not improve or worsen, to please contact office for sooner follow up or seek emergency care.    I spent 35 minutes of dedicated to the care of this patient on the date of this encounter to include pre-visit review of records, face-to-face time with the patient discussing conditions above, post visit ordering of testing, clinical documentation with the electronic  health record, making appropriate referrals as documented, and communicating necessary findings to members of the patients care team.     C. Leita Sanders, MD Advanced Bronchoscopy PCCM [facility]    *This note was generated using voice recognition software/Dragon and/or AI transcription program.  Despite best efforts to proofread, errors can occur which can change the meaning. Any transcriptional errors that result from this process are unintentional and may not be fully corrected at the time of dictation.

## 2023-09-16 NOTE — Patient Instructions (Addendum)
 VISIT SUMMARY:  You came in today for a follow-up evaluation of your lung nodule. The nodule has shown growth since your last visit, and a second biopsy is needed to get a more accurate diagnosis. We also discussed your history of emphysema and your continued vaping habit.  YOUR PLAN:  -RIGHT LUNG SUBSOLID NODULE WITH INTERVAL GROWTH: The nodule in your right lung has grown, which could indicate a progression. It has both solid and cloud-like parts, and the solid part is more likely to give us  useful information. Because the first biopsy might not have given us  a complete picture, we need to do another biopsy. We have scheduled a robotic assistant navigational bronchoscopy for biopsy on September 8th. Additionally, you will need a follow-up scan on the Friday before the biopsy to help us  map airways to the nodule.  -EMPHYSEMA: Emphysema is a condition where the air sacs in your lungs are damaged, making it hard to breathe. Currently, you do not have symptoms like shortness of breath, and your lung sounds are clear.  -NICOTINE DEPENDENCE (VAPING): You continue to vape, which means you are still dependent on nicotine. This is important for your lung health and the management of your lung nodule.  INSTRUCTIONS:  Please make sure to attend the scheduled biopsy on September 8th and get the follow-up scan on the Friday before the biopsy.   We discussed that the procedure would have to be done under general anesthesia. The anesthesia team will discuss his part of the process with you.  Complications from the procedure itself are usually minor. One potential complication would be collapse of the lung which can occur in 2-3% of the cases. If  this happens we would have to put a small tube to relieve the collapse and you would have to spend the night in the hospital in that event.  Another possibility would be that of bleeding, this is usually taken care of during the procedure.  In the situations you may  need to be observed overnight.  For the most part though, should be able to go home the same day.  Other possibilities could include that the procedure would be nondiagnostic meaning that no definitive diagnosis could be attained.  We strive to try to decrease these potential issues.

## 2023-09-16 NOTE — H&P (View-Only) (Signed)
 Subjective:    Patient ID: Maxwell Young, male    DOB: [birth year], 68 y.o.   MRN: [id number]  Patient Care Team: Vicci Duwaine SQUIBB, DO as PCP - General (Family Medicine) End, Lonni, MD as PCP - Cardiology (Cardiology)  Chief Complaint  Patient presents with   Lung Mass    BACKGROUND/INTERVAL:68 year old former smoker with a 17 pack-year history of smoking, currently vaping, with a history as noted below who presents for follow-up of an abnormal lung cancer screening CT. he underwent robotic assisted navigational bronchoscopy on 26 May 2023.  Pathology consistent with inflammatory change.  Patient was last seen on 19 Jun 2023, had chest CT performed 26 August 2023.  He presents for follow-up.  HPI Discussed the use of AI scribe software for clinical note transcription with the patient, who gave verbal consent to proceed.  History of Present Illness   Maxwell Young is a 68 year old male with a lung nodule who presents for follow-up evaluation.  He is being monitored for a lung nodule that has demonstrated growth since the last evaluation. The nodule is characterized as a mixed or subsolid nodule with both solid and cloud-like components. A previous biopsy was conducted, but due to the nodule's structure, it may not have provided a complete assessment, necessitating a second biopsy.  He has a history of emphysema, confirmed through prior imaging studies. He denies experiencing shortness of breath.  He continues to vape, which is relevant to his pulmonary health and the management of his lung nodule.     Review of Systems A 10 point review of systems was performed and it is as noted above otherwise negative.   Patient Active Problem List   Diagnosis Date Noted   Nodule of left lung 05/26/2023   Non-recurrent bilateral inguinal hernia without obstruction or gangrene 01/07/2023   Abnormal EKG 11/28/2022   Ectatic abdominal aorta (HCC) 09/24/2022   Advance care planning  09/12/2022   Polyp of transverse colon    Abnormal PSA 08/31/2019   Benign prostatic hyperplasia with lower urinary tract symptoms 08/31/2019   Type 2 diabetes mellitus with other specified complication (HCC) 09/07/2018   COPD (chronic obstructive pulmonary disease) with emphysema (HCC) 08/26/2017   Aortic atherosclerosis (HCC) 02/24/2017   PAD (peripheral artery disease) (HCC) 02/24/2017   Personal history of tobacco use, presenting hazards to health 07/27/2015   History of colonic polyps    Benign neoplasm of transverse colon    Rectal polyp    Smoking greater than 40 pack years 06/13/2015   Essential hypertension 12/14/2014   CAD (coronary artery disease) 12/14/2014   GERD (gastroesophageal reflux disease) 12/14/2014   Hyperlipidemia associated with type 2 diabetes mellitus (HCC) 12/14/2014    Social History   Tobacco Use   Smoking status: Former    Current packs/day: 0.00    Average packs/day: 1.5 packs/day for 45.8 years (68.7 ttl pk-yrs)    Types: E-cigarettes, Cigarettes    Start date: 55    Quit date: 11/21/2021    Years since quitting: 1.8    Passive exposure: Past   Smokeless tobacco: Never   Tobacco comments:    Currently vapes.  Substance Use Topics   Alcohol use: No    No Known Allergies  Current Meds  Medication Sig   amLODipine  (NORVASC ) 10 MG tablet Take 1 tablet (10 mg total) by mouth daily.   aspirin EC 81 MG tablet Take 81 mg by mouth daily. Swallow whole.   Blood Glucose  Monitoring Suppl (CONTOUR NEXT ONE) KIT 1 each by Does not apply route in the morning and at bedtime.   glucose blood test strip USE DAILY TO CHECK BLOOD SUGAR.   Lancets (ONETOUCH ULTRASOFT) lancets USE DAILY TO CHECK BLOOD SUGAR.   metFORMIN  (GLUCOPHAGE ) 500 MG tablet Take 2 tablets (1,000 mg total) by mouth 2 (two) times daily with a meal.   metoprolol  succinate (TOPROL -XL) 100 MG 24 hr tablet Take 1 tablet (100 mg total) by mouth daily. Take with or immediately following a meal.    Multiple Vitamins-Minerals (MULTIVITAMIN ADULT PO) Take 1 tablet by mouth daily.   omega-3 acid ethyl esters (LOVAZA ) 1 g capsule TAKE 2 CAPSULES TWICE A DAY   omeprazole  (PRILOSEC) 20 MG capsule Take 1 capsule (20 mg total) by mouth daily.   rosuvastatin  (CRESTOR ) 40 MG tablet Take 1 tablet (40 mg total) by mouth daily.   tamsulosin  (FLOMAX ) 0.4 MG CAPS capsule Take 2 capsules (0.8 mg total) by mouth daily.    Immunization History  Administered Date(s) Administered   Fluad Trivalent(High Dose 65+) 12/31/2022   Influenza,inj,Quad PF,6+ Mos 12/14/2014, 01/01/2016, 11/23/2020, 12/03/2021   PFIZER(Purple Top)SARS-COV-2 Vaccination 09/29/2019, 10/27/2019   PNEUMOCOCCAL CONJUGATE-20 06/11/2022   Pneumococcal Polysaccharide-23 08/23/2020   Pneumococcal-Unspecified 04/25/2011   Td 11/23/2020   Tdap 10/04/2010   Zoster Recombinant(Shingrix ) 12/03/2021, 03/05/2022        Objective:   BP 120/70   Pulse 62   Temp 98.1 F (36.7 C) (Oral)   Ht 6' (1.829 m)   Wt 163 lb 9.6 oz (74.2 kg)   SpO2 97%   BMI 22.19 kg/m   SpO2: 97 %  GENERAL: Well-developed, well-nourished gentleman, no acute distress.  Fully ambulatory, no conversational dyspnea. HEAD: Normocephalic, atraumatic.  EYES: Pupils equal, round, reactive to light.  No scleral icterus.  MOUTH: Dentures both.  Oral mucosa moist.  No thrush.  Mallampati II airway NECK: Supple. No thyromegaly. Trachea midline. No JVD.  No adenopathy. PULMONARY: Good air entry bilaterally.  No adventitious sounds. CARDIOVASCULAR: S1 and S2. Regular rate and rhythm.  No rubs, murmurs or gallops heard. ABDOMEN: Benign. MUSCULOSKELETAL: No joint deformity, no clubbing, no edema.  NEUROLOGIC: No overt focal deficit, no gait disturbance, speech is fluent. SKIN: Intact,warm,dry. PSYCH: Mood and behavior normal.  Representative images from CT scan performed 26 August 2023 showing the 2 lesions in question in the left upper lobe (arrows):        Assessment & Plan:     ICD-10-CM   1. Nodule of left lung - 18 mm  R91.1 CT SUPER D CHEST WO MONARCH PILOT    2. Pulmonary emphysema, unspecified emphysema type (HCC)  J43.9     3. Personal history of tobacco use, presenting hazards to health  Z87.891     4. Vapes nicotine containing substance  Z72.0       Orders Placed This Encounter  Procedures   CT SUPER D CHEST WO MONARCH PILOT    Standing Status:   Future    Number of Occurrences:   1    Expiration Date:   09/15/2024    Scheduling Instructions:     Please do before 29 September 2023.  Patient has robotic assisted navigational bronchoscopy on 8 September. Patient can do 5  Sept (Friday)    Preferred imaging location?:   [facility]   Discussion:    Right lung subsolid nodule with interval growth The right lung subsolid nodule has shown interval growth, indicating potential progression. The  nodule is mixed with both solid and subsolid components. The solid component is more likely to provide diagnostic information. A previous biopsy may have been inconclusive due to surrounding cloud-like inflammation, necessitating a repeat biopsy for a definitive diagnosis. - Schedule robotic assisted navigational bronchoscopy for biopsy on September 8th. - Order a follow-up scan to be performed on the Friday before the biopsy to assist in tracking the nodule.  Emphysema Emphysema is present without symptoms of shortness of breath. Lung sounds are clear.  Nicotine dependence (vaping) Continues to vape, indicating ongoing nicotine dependence.     Advised if symptoms do not improve or worsen, to please contact office for sooner follow up or seek emergency care.    I spent 35 minutes of dedicated to the care of this patient on the date of this encounter to include pre-visit review of records, face-to-face time with the patient discussing conditions above, post visit ordering of testing, clinical documentation with the electronic  health record, making appropriate referrals as documented, and communicating necessary findings to members of the patients care team.     C. Leita Sanders, MD Advanced Bronchoscopy PCCM [facility]    *This note was generated using voice recognition software/Dragon and/or AI transcription program.  Despite best efforts to proofread, errors can occur which can change the meaning. Any transcriptional errors that result from this process are unintentional and may not be fully corrected at the time of dictation.

## 2023-09-17 NOTE — Telephone Encounter (Signed)
 For the codes 68372, [id number], [id number] Prior Auth Not Required Refer # [id number]

## 2023-09-17 NOTE — Telephone Encounter (Signed)
 Noted. Nothing further needed.

## 2023-09-19 ENCOUNTER — Telehealth: Payer: Self-pay

## 2023-09-19 NOTE — Telephone Encounter (Signed)
   Pre-operative Risk Assessment    Patient Name: Maxwell Young  DOB: 1955/05/05 MRN: [id number]   Date of last office visit: 08/21/23 SHERI HAMMOCK, NP Date of next office visit: NONE   Request for Surgical Clearance    Procedure:  ENB/EBUS  Date of Surgery:  Clearance 09/29/23                                Surgeon:  Dr. Dedra Sanders, MD  Surgeon's Group or Practice Name:  [facility]  Phone number:  [phone]  Fax number:  [phone] (In efforts to conserve resources (paper), return FAX not required. I will follow up in [facility]).    Type of Clearance Requested:   - Medical  - Pharmacy:  Hold Aspirin PER REQUEST: N/A - surgeon has cleared patient to continue their daily LOW DOSE ASPIRIN throughout the perioperative course. Dose will be held on the day of surgery only.    Type of Anesthesia:  General    Additional requests/questions:    Signed, Lucie DELENA Ku   09/19/2023, 5:05 PM      Elnor Dorise BRAVO, NP  P Cv Div Preop Callback Request for pre-operative cardiac clearance:   1. What type of surgery is being performed? ENB/EBUS  2. When is this surgery scheduled? 09/29/2023   3. Type of clearance being requested (medical, pharmacy, both)? MEDICAL   4. Are there any medications that need to be held prior to surgery? N/A - surgeon has cleared patient to continue their daily LOW DOSE ASPIRIN throughout the perioperative course. Dose will be held on the day of surgery only.  5. Practice name and name of physician performing surgery? Performing surgeon: Dr. Dedra Sanders, MD Requesting clearance: Dorise Elnor, FNP-C     6. Anesthesia type (none, local, MAC, general)? GENERAL  7. What is the office phone and fax number?   Fax: [phone] (In efforts to conserve resources (paper), return FAX not required. I will follow up in [facility]).  ATTENTION: Unable to create telephone message as per your standard workflow. Directed by [facility]  providers to send requests for cardiac clearance to this pool for appropriate distribution to provider covering pre-operative clearances.  Dorise Elnor, MSN, APRN, FNP-C, CEN [facility] Peri-operative Services Nurse Practitioner Phone: [phone] 09/19/23 4:03 PM

## 2023-09-19 NOTE — Telephone Encounter (Signed)
-----   Message from Dorise CHARLENA Pereyra sent at 09/19/2023  4:03 PM EDT ----- Regarding: Request for pre-operative cardiac clearance Request for pre-operative cardiac clearance:  1. What type of surgery is being performed?  ENB/EBUS  2. When is this surgery scheduled?  09/29/2023  3. Type of clearance being requested (medical, pharmacy, both)? MEDICAL   4. Are there any medications that need to be held prior to surgery? N/A - surgeon has cleared patient to continue their daily LOW DOSE ASPIRIN throughout the perioperative course. Dose will be held on the day of surgery only.   5. Practice name and name of physician performing surgery?  Performing surgeon: Dr. Dedra Sanders, MD Requesting clearance: Dorise Pereyra, FNP-C    6. Anesthesia type (none, local, MAC, general)? GENERAL  7. What is the office phone and fax number?   Fax: [phone] (In efforts to conserve resources (paper), return FAX not required. I will follow up in [facility]).  ATTENTION: Unable to create telephone message as per your standard workflow. Directed by [facility] providers to send requests for cardiac clearance to this pool for appropriate distribution to provider covering pre-operative clearances.   Dorise Pereyra, MSN, APRN, FNP-C, CEN [facility]  Peri-operative Services Nurse Practitioner Phone: [phone] 09/19/23 4:03 PM

## 2023-09-23 ENCOUNTER — Encounter
Admission: RE | Admit: 2023-09-23 | Discharge: 2023-09-23 | Disposition: A | Discharge status: Home / Self Care | Source: Ambulatory Visit | Attending: Pulmonary Disease | Admitting: Pulmonary Disease

## 2023-09-23 ENCOUNTER — Other Ambulatory Visit: Payer: Self-pay

## 2023-09-23 NOTE — Telephone Encounter (Signed)
 On previous visit patient was without symptoms of decompensation.  He can been cleared from a cardiac perspective at acceptable risk without the need for further cardiac testing prior to his procedure.

## 2023-09-23 NOTE — Telephone Encounter (Signed)
     Primary Cardiologist: Lonni Hanson, MD  Chart reviewed as part of pre-operative protocol coverage. Given past medical history and time since last visit, based on ACC/AHA guidelines, LEEUM SANKEY would be at acceptable risk for the planned procedure without further cardiovascular testing.   Patient was advised that if he develops new symptoms prior to surgery to contact our office to arrange a follow-up appointment.  He verbalized understanding.  Aspirin may be held day of procedure.  Please resume as soon as hemostasis is achieved.  I will route this recommendation to the requesting party via Epic fax function and remove from pre-op pool.  Please call with questions.  Josefa HERO. Narjis Mira NP-C     09/23/2023, 10:56 AM [facility] [address] [place], [place] [address] Office [phone]

## 2023-09-23 NOTE — Patient Instructions (Addendum)
 Your procedure is scheduled on: MONDAY 09/29/23 Report to the Registration Desk on the 1st floor of the [facility]. To find out your arrival time, please call [phone] between 1PM - 3PM on: FRIDAY 09/26/23 If your arrival time is 6:00 am, do not arrive before that time as the [facility] entrance doors do not open until 6:00 am.  REMEMBER: Instructions that are not followed completely may result in serious medical risk, up to and including death; or upon the discretion of your surgeon and anesthesiologist your surgery may need to be rescheduled.  Do not eat food after midnight the night before surgery.  No gum chewing or hard candies.  You may however, drink WATER  up to 2 hours before you are scheduled to arrive for your surgery. Do not drink anything within 2 hours of your scheduled arrival time.  One week prior to surgery: Stop Anti-inflammatories (NSAIDS) such as Advil, Aleve, Ibuprofen, Motrin, Naproxen, Naprosyn and Aspirin based products such as Excedrin, Goody's Powder, BC Powder.  Stop ANY OVER THE COUNTER supplements until after surgery.  You may however, continue to take Tylenol  if needed for pain up until the day of surgery.  STOP metFORMIN  (GLUCOPHAGE ) 2 days prior to surgery. Last dose 09/26/23  Continue taking all of your other prescription medications up until the day of surgery.  ON THE DAY OF SURGERY ONLY TAKE THESE MEDICATIONS WITH SIPS OF WATER :  amLODipine  (NORVASC )  metoprolol  succinate (TOPROL -XL)  omeprazole  (PRILOSEC)  rosuvastatin  (CRESTOR )   Use inhalers on the day of surgery and bring to the hospital.  No Alcohol for 24 hours before or after surgery.  No Smoking including e-cigarettes for 24 hours before surgery.  No chewable tobacco products for at least 6 hours before surgery.  No nicotine patches on the day of surgery.  Do not use any recreational drugs for at least a week (preferably 2 weeks) before your surgery.  Please be advised that the  combination of cocaine and anesthesia may have negative outcomes, up to and including death. If you test positive for cocaine, your surgery will be cancelled.  On the morning of surgery brush your teeth with toothpaste and water , you may rinse your mouth with mouthwash if you wish. Do not swallow any toothpaste or mouthwash.  Do not wear jewelry, make-up, hairpins, clips or nail polish.  For welded (permanent) jewelry: bracelets, anklets, waist bands, etc.  Please have this removed prior to surgery.  If it is not removed, there is a chance that hospital personnel will need to cut it off on the day of surgery.  Do not wear lotions, powders, or perfumes.   Do not shave body hair from the neck down 48 hours before surgery.  Contact lenses, hearing aids and dentures may not be worn into surgery.  Do not bring valuables to the hospital. [facility] is not responsible for any missing/lost belongings or valuables.   Bring your C-PAP to the hospital in case you may have to spend the night.   Notify your doctor if there is any change in your medical condition (cold, fever, infection).  Wear comfortable clothing (specific to your surgery type) to the hospital.  After surgery, you can help prevent lung complications by doing breathing exercises.  Take deep breaths and cough every 1-2 hours. Your doctor may order a device called an Incentive Spirometer to help you take deep breaths. When coughing or sneezing, hold a pillow firmly against your incision with both hands. This is called "  splinting." Doing this helps protect your incision. It also decreases belly discomfort.  If you are being discharged the day of surgery, you will not be allowed to drive home. You will need a responsible individual to drive you home and stay with you for 24 hours after surgery.   If you are taking public transportation, you will need to have a responsible individual with you.  Please call the Pre-admissions Testing  Dept. at [phone] if you have any questions about these instructions.  Surgery Visitation Policy:  Patients having surgery or a procedure may have two visitors.  Children under the age of 80 must have an adult with them who is not the patient.  Merchandiser, retail to address health-related social needs:  [email or web address]

## 2023-09-23 NOTE — Telephone Encounter (Signed)
 Maxwell Young,  You recently saw Maxwell Young in clinic on 08/21/2023.  He was doing well at that time.  Follow-up was planned for 6 to 8 months.  His PMH includes coronary artery disease status post CABG x 3 in 2008, HTN, HLD, and pulmonary nodule.  He is requesting preoperative cardiac evaluation for ENB/EBUS.  Would you be able to comment on her risk for upcoming procedure from a cardiac standpoint?  Thank you for your help.  Please direct your response to CV DIV preop pool.  Josefa HERO. Annakate Soulier NP-C     09/23/2023, 8:46 AM [facility] [address] 5th Floor [place], [place] [address] Office [phone]

## 2023-09-26 ENCOUNTER — Ambulatory Visit
Admission: RE | Admit: 2023-09-26 | Discharge: 2023-09-26 | Disposition: A | Discharge status: Home / Self Care | Source: Ambulatory Visit | Attending: Pulmonary Disease | Admitting: Pulmonary Disease

## 2023-09-26 ENCOUNTER — Encounter
Admission: RE | Admit: 2023-09-26 | Discharge: 2023-09-26 | Disposition: A | Discharge status: Home / Self Care | Source: Ambulatory Visit | Attending: Pulmonary Disease | Admitting: Pulmonary Disease

## 2023-09-26 ENCOUNTER — Encounter: Payer: Self-pay | Admitting: Urgent Care

## 2023-09-26 DIAGNOSIS — E1169 Type 2 diabetes mellitus with other specified complication: Secondary | ICD-10-CM | POA: Diagnosis not present

## 2023-09-26 DIAGNOSIS — I2510 Atherosclerotic heart disease of native coronary artery without angina pectoris: Secondary | ICD-10-CM

## 2023-09-26 DIAGNOSIS — I10 Essential (primary) hypertension: Secondary | ICD-10-CM | POA: Insufficient documentation

## 2023-09-26 DIAGNOSIS — Z860100 Personal history of colon polyps, unspecified: Secondary | ICD-10-CM

## 2023-09-26 DIAGNOSIS — Z01812 Encounter for preprocedural laboratory examination: Secondary | ICD-10-CM

## 2023-09-26 DIAGNOSIS — R911 Solitary pulmonary nodule: Secondary | ICD-10-CM | POA: Diagnosis not present

## 2023-09-26 LAB — BASIC METABOLIC PANEL WITH GFR
Anion gap: 9 (ref 5–15)
BUN: 16 mg/dL (ref 8–23)
CO2: 25 mmol/L (ref 22–32)
Calcium: 8.9 mg/dL (ref 8.9–10.3)
Chloride: 104 mmol/L (ref 98–111)
Creatinine, Ser: 0.81 mg/dL (ref 0.61–1.24)
GFR, Estimated: >60 mL/min (ref >60)
Glucose, Bld: 112 mg/dL — ABNORMAL HIGH (ref 70–99)
Potassium: 4.1 mmol/L (ref 3.5–5.1)
Sodium: 138 mmol/L (ref 135–145)

## 2023-09-26 LAB — CBC
HCT: 44.3 % (ref 39.0–52.0)
Hemoglobin: 15.2 g/dL (ref 13.0–17.0)
MCH: 31.1 pg (ref 26.0–34.0)
MCHC: 34.3 g/dL (ref 30.0–36.0)
MCV: 90.8 fL (ref 80.0–100.0)
Platelets: 178 K/uL (ref 150–400)
RBC: 4.88 MIL/uL (ref 4.22–5.81)
RDW: 12.8 % (ref 11.5–15.5)
WBC: 9 K/uL (ref 4.0–10.5)
nRBC: 0 % (ref 0.0–0.2)

## 2023-09-27 ENCOUNTER — Encounter: Payer: Self-pay | Admitting: Pulmonary Disease

## 2023-09-27 NOTE — Progress Notes (Signed)
 Perioperative / Anesthesia Services  Pre-Admission Testing Clinical Review / Pre-Operative Anesthesia Consult  Date: 09/27/23  PATIENT DEMOGRAPHICS: Name: Maxwell Young DOB: Dec 12, 1955 MRN:   [id number]  Note: Available PAT nursing documentation and vital signs have been reviewed. Clinical nursing staff has updated patient's PMH/PSHx, current medication list, and drug allergies/intolerances to ensure complete and comprehensive history available to assist care teams in MDM as it pertains to the aforementioned surgical procedure and anticipated anesthetic course. Extensive review of available clinical information personally performed. [place] PMH and PSHx updated with any diagnoses/procedures that  may have been inadvertently omitted during his intake with the pre-admission testing department's nursing staff.  PLANNED SURGICAL PROCEDURE(S):   Case: [id number] Date/Time: 09/29/23 9081   Procedures:      VIDEO BRONCHOSCOPY WITH ENDOBRONCHIAL NAVIGATION (Left)     ENDOBRONCHIAL ULTRASOUND (EBUS) (Left)   Anesthesia type: General   Diagnosis: Lung nodule [R91.1]   Pre-op diagnosis: Lung Nodule   Location: [facility] PROCEDURE RM 02 / [facility] ORS FOR ANESTHESIA GROUP   Surgeons: Tamea Dedra CROME, MD        CLINICAL DISCUSSION: Maxwell Young is a 68 y.o. male who is submitted for pre-surgical anesthesia review and clearance prior to him undergoing the above procedure. Patient is a Former Smoker (40 pack years). Pertinent PMH includes: CAD (s/p CABG), diastolic dysfunction, aortic ectasia, bifascicular block (RBBB + LPFB), aortic atherosclerosis, HTN, HLD, T2DM, emphysema, GERD (on daily PPI), cervical DDD (s/p C4-C6 interbody fusion), thoracic DDD, BILATERAL inguinal hernia, BPH.   Patient is followed by cardiology (End, MD). He was last seen in the cardiology clinic on 08/21/2023; notes reviewed. At the time of his clinic visit, patient doing well overall from a cardiovascular perspective.  Patient denied any chest pain, shortness of breath, PND, orthopnea, palpitations, significant peripheral edema, weakness, fatigue, vertiginous symptoms, or presyncope/syncope. Patient with a past medical history significant for cardiovascular diagnoses. Documented physical exam was grossly benign, providing no evidence of acute exacerbation and/or decompensation of the patient's known cardiovascular conditions.  Patient underwent diagnostic LEFT heart catheterization on 11/13/2006 revealing multivessel CAD; 90% mid LAD, 70% D1, 60% D2, 70% proximal LCx, 100% proximal RCA, and 95% RV marginal stenosis.  Given the degree and complexity of patient's coronary artery disease, patient was referred to CVTS for consideration of revascularization.   Patient underwent three-vessel revascularization procedure on 11/21/2006.  LIMA-LAD, SVG-OM2, and SVG-PDA bypass grafts were placed.   Most recent TTE was performed on 12/11/2022 revealing normal left ventricular systolic function with an EF of 60 to 65%.  There were no regional wall motion abnormalities. Left ventricular diastolic Doppler parameters consistent with abnormal relaxation (G1DD).   Right ventricular size and function normal with a TAPSE measuring 1.9 cm  (normal range >/= 1.6 cm). There was mild mitral valve regurgitation.  There was mild to moderate tricuspid valve regurgitation.  Mild calcification of the aortic valve was observed. All transvalvular gradients were noted to be normal providing no evidence suggestive of valvular stenosis. Aorta normal in size with no evidence of aneurysmal dilatation.  Blood pressure well controlled at 100/60 mmHg on currently prescribed CCB (amlodipine ) and beta-blocker (metoprolol  succinate) therapies.  Patient is on rosuvastatin  + Lovaza  for his HLD diagnosis and ASCVD prevention.  T2DM reasonably controlled on currently prescribed regimen; A1c was 7.1% when checked on 03/31/2023. Patient does not have an OSAH diagnosis.  Patient is able to complete all of his  ADL/IADLs without cardiovascular limitation.  Per the DASI, patient is able  to achieve at least 4 METS of physical activity without experiencing any significant degree of angina/anginal equivalent symptoms.  No changes were made to his medication regimen.  Patient to follow-up with outpatient cardiology in 6 months or sooner if needed.   Maxwell Young underwent low-dose lung cancer screening chest CT on 03/17/2023.  Study revealed a new 12.8 mm irregular nodule in the anterior segment of the LEFT upper lobe.  Subsequent PET scan performed on 04/16/2023 further characterized the nodule as being 12 mm and spiculated in morphology; maximum SUV 7.2.  Patient underwent ENB/EBUS on 05/22/2023 with pathology being more consistent with inflammatory changes.  Patient was treated with azithromycin .  Repeat interval imaging was performed on 05/16/2023 redemonstrated the irregular LEFT upper pulmonary lobe nodule that measured 10 x 13 mm.  Findings remain concerning for bronchogenic neoplasm.  Most recent imaging was obtained on 08/26/2023 with interval increase in size of the nodule to 18 x 18 mm (previously 13 x 10 mm).  Patient was scheduled for repeat BRONCHOSCOPY, WITH BIOPSY USING ELECTROMAGNETIC NAVIGATION (Left) on 09/29/2023 with Dr. Dedra Sanders, MD.   Given patient's past medical history significant for cardiovascular diagnoses, presurgical cardiac clearance was sought by the PAT team. Per cardiology, based ACC/AHA guidelines, the patient's past medical history, and the amount of time since his last clinic visit, this patient would be at an overall ACCEPTABLE risk for the planned procedure without further cardiovascular testing or intervention at this time.   In review of the patient's chart, it is noted that he is on daily oral antithrombotic therapy. Given that patient's past medical history is significant for cardiovascular diagnoses, including but not limited  to CAD, pulmonary medicine has cleared patient to continue his daily low dose ASA throughout his perioperative course.  Patient has been updated on these directives from his specialty care providers by the PAT team.  Patient denies previous perioperative complications with anesthesia in the past. In review his EMR, it is noted that patient underwent a general anesthetic course here at [facility] ([facility] in 05/2023 without documented complications.   MOST RECENT VITAL SIGNS:    09/23/2023    2:19 PM 09/16/2023    3:35 PM 08/21/2023    3:24 PM  Vitals with BMI  Height 6' 0 6' 0 6' 0  Weight 163 lbs 9 oz 163 lbs 10 oz 165 lbs 10 oz  BMI 22.18 22.18 22.45  Systolic  120 100  Diastolic  70 60  Pulse  62 59   PROVIDERS/SPECIALISTS: NOTE: Primary physician provider listed below. Patient may have been seen by APP or partner within same practice.   PROVIDER ROLE / SPECIALTY LAST SHERLEAN Sanders Dedra LITTIE, MD Pulmonary Medicine (Surgeon) 09/16/2023  Vicci Duwaine SQUIBB, DO Primary Care Provider 07/07/2023  Mady Bruckner, MD Cardiology 08/21/2023; preop APP call 09/23/2023   ALLERGIES: No Known Allergies  CURRENT HOME MEDICATIONS: No current facility-administered medications for this encounter.    amLODipine  (NORVASC ) 10 MG tablet   aspirin EC 81 MG tablet   Blood Glucose Monitoring Suppl (CONTOUR NEXT ONE) KIT   glucose blood test strip   Lancets (ONETOUCH ULTRASOFT) lancets   metFORMIN  (GLUCOPHAGE ) 500 MG tablet   metoprolol  succinate (TOPROL -XL) 100 MG 24 hr tablet   Multiple Vitamins-Minerals (MULTIVITAMIN ADULT PO)   omega-3 acid ethyl esters (LOVAZA ) 1 g capsule   omeprazole  (PRILOSEC) 20 MG capsule   rosuvastatin  (CRESTOR ) 40 MG tablet   tamsulosin  (FLOMAX ) 0.4 MG  CAPS capsule   HISTORY: Past Medical History:  Diagnosis Date   Abdominal aortic ectasia (HCC) 08/29/2022   a.) US  aorta 08/29/2022: 2.6 cm   Aortic atherosclerosis (HCC)     Bilateral inguinal hernia    BPH with elevated PSA and lower urinary tract symptoms    CAD (coronary artery disease) 11/13/2006   a.) LHC 11/13/2006: 90% mLAD, 70% D1, 60% D2, 70% pLCx, 100% pRCA, 95% RV marginal - refer to CVTS; b.) s/p 3v CABG 11/21/2006   Cholelithiasis    Colon polyps 01/21/2009   DDD (degenerative disc disease), cervical    a.) s/p C4-C6 discectomy with interbody fusion and anterior plating 06/29/2001   DDD (degenerative disc disease), thoracic    Diastolic dysfunction    a.) TTE 12/11/2022: EF 60-65%, no RWMAs, G1DD, norm RVSF, mild MR, mild-mod TR, AoV sclerosis without stenosis.   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    S/P CABG x 3 11/21/2006   a.) LIMA-LAD, SVG-OM2, SVG-PDA   T2DM (type 2 diabetes mellitus) (HCC)    Vapes nicotine containing substance    Past Surgical History:  Procedure Laterality Date   BRONCHOSCOPY, WITH BIOPSY USING ELECTROMAGNETIC NAVIGATION Left 05/26/2023   Procedure: BRONCHOSCOPY, WITH BIOPSY USING ELECTROMAGNETIC NAVIGATION;  Surgeon: Tamea Dedra CROME, MD;  Location: [facility] ORS;  Service: Pulmonary;  Laterality: Left;   CARDIAC CATHETERIZATION     CATARACT EXTRACTION W/ INTRAOCULAR LENS IMPLANT Right 03/04/2022   CERVICAL FUSION N/A 06/29/2001   COLONOSCOPY     COLONOSCOPY N/A 09/14/2020   Procedure: COLONOSCOPY;  Surgeon: Jinny Carmine, MD;  Location: [facility] ENDOSCOPY;  Service: Endoscopy;  Laterality: N/A;   COLONOSCOPY WITH  PROPOFOL  N/A 07/24/2015   Procedure: COLONOSCOPY WITH PROPOFOL ;  Surgeon: Carmine Jinny, MD;  Location: [facility];  Service: Endoscopy;  Laterality: N/A;   CORONARY ARTERY BYPASS GRAFT  11/21/2006   Procedure: CORONARY ARTERY BYPASS GRAFT; Location: [facility]; Surgeon: Lajoyce Farr, MD   INSERTION OF MESH  01/07/2023   Procedure: INSERTION OF MESH;  Surgeon: Jordis Laneta FALCON, MD;  Location: [facility] ORS;  Service: General;;   LEFT HEART CATH AND CORONARY ANGIOGRAPHY Left 11/13/2006   Procedure: LEFT HEART CATH AND CORONARY ANGIOGRAPHY; Location: [facility]; Surgeon: Denyse Bathe, MD   POLYPECTOMY  07/24/2015   Procedure: POLYPECTOMY;  Surgeon: Carmine Jinny, MD;  Location: [facility];  Service: Endoscopy;;   VIDEO BRONCHOSCOPY WITH ENDOBRONCHIAL ULTRASOUND Left 05/26/2023   Procedure: BRONCHOSCOPY, WITH EBUS;  Surgeon: Tamea Dedra CROME, MD;  Location: [facility] ORS;  Service: Pulmonary;  Laterality: Left;   Family History  Problem Relation Age of Onset   Hypertension Mother    Heart disease Mother        CAD   Heart failure Mother    Cancer Father        colon, prostate   Stroke Father    Parkinson's disease Father    Social History  Tobacco Use   Smoking status: Former    Current packs/day: 0.00    Average packs/day: 1.5 packs/day for 45.8 years (68.7 ttl pk-yrs)    Types: E-cigarettes, Cigarettes    Start date: 1    Quit date: 11/21/2021    Years since quitting: 1.8    Passive exposure: Past   Smokeless tobacco: Never   Tobacco comments:    Currently vapes.  Substance Use Topics   Alcohol use: No   LABS:  Hospital Outpatient Visit on 09/26/2023  Component Date Value Ref Range Status   WBC 09/26/2023 9.0  4.0 - 10.5 K/uL Final   RBC 09/26/2023 4.88  4.22 - 5.81 MIL/uL Final   Hemoglobin 09/26/2023 15.2  13.0 - 17.0 g/dL Final   HCT 90/94/7974 44.3  39.0 - 52.0 % Final   MCV 09/26/2023 90.8  80.0 - 100.0 fL Final   MCH 09/26/2023 31.1  26.0 - 34.0 pg Final   MCHC 09/26/2023  34.3  30.0 - 36.0 g/dL Final   RDW 90/94/7974 12.8  11.5 - 15.5 % Final   Platelets 09/26/2023 178  150 - 400 K/uL Final   nRBC 09/26/2023 0.0  0.0 - 0.2 % Final   Performed at [facility], [address] [place], [place] [address]   Sodium 09/26/2023 138  135 - 145 mmol/L Final   Potassium 09/26/2023 4.1  3.5 - 5.1 mmol/L Final   Chloride 09/26/2023 104  98 - 111 mmol/L Final   CO2 09/26/2023 25  22 - 32 mmol/L Final   Glucose, Bld 09/26/2023 112 (H)  70 - 99 mg/dL Final   Glucose reference range applies only to samples taken after fasting for at least 8 hours.   BUN 09/26/2023 16  8 - 23 mg/dL Final   Creatinine, Ser 09/26/2023 0.81  0.61 - 1.24 mg/dL Final   Calcium  09/26/2023 8.9  8.9 - 10.3 mg/dL Final   GFR, Estimated 09/26/2023 >60  >60 mL/min Final   Comment: (NOTE) Calculated using the CKD-EPI Creatinine Equation (2021)    Anion gap 09/26/2023 9  5 - 15 Final   Performed at [facility], [address] [place], [place] [address]    ECG: Date: 08/21/2023  Time ECG obtained: 1531 PM Rate: 59 bpm Rhythm: Sinus bradycardia; RBBB Axis (leads I and aVF): normal Intervals: PR 166 ms. QRS 126 ms. QTc 392 ms. ST segment and T wave changes: No evidence of acute T wave abnormalities or significant ST segment elevation or depression.  Evidence of a possible, age undetermined, prior infarct:  Yes; inferior Comparison: Similar to previous tracing obtained on 11/27/2022   IMAGING / PROCEDURES: CT CHEST WO CONTRAST performed on 08/26/2023 Interval progression of the irregular anterior left upper lobe pulmonary nodule measuring 18 x 18 mm today compared to 13 x 10 mm previously. Imaging features remain concerning for primary bronchogenic neoplasm. Just lateral to this lesion there is subpleural interstitial thickening with subtle airspace consolidative disease, mildly progressive since prior. Cholelithiasis. Aortic atherosclerosis Emphysema  CT SUPER D CHEST  WO MONARCH PILOT performed on 08/15/2023 Irregular left upper lobe nodule, hypermetabolic on 04/16/2023 and new from 03/14/2022. Findings are indicative of stage IA primary bronchogenic carcinoma. Cholelithiasis. Tiny right renal stone. Aortic atherosclerosis  Coronary artery calcification. Emphysema   PULMONARY FUNCTION TESTING performed on 05/14/2023     Latest Ref Rng & Units 05/14/2023    2:57 PM  PFT Results  FVC-Pre L 4.41   FVC-Predicted Pre % 93  FVC-Post L 4.63   FVC-Predicted Post % 97   Pre FEV1/FVC % % 69   Post FEV1/FCV % % 71   FEV1-Pre L 3.06   FEV1-Predicted Pre % 86   FEV1-Post L 3.27   DLCO uncorrected ml/min/mmHg 19.36   DLCO UNC% % 71   DLVA Predicted % 75   TLC L 7.40   TLC % Predicted % 102   RV % Predicted % 103     NM PET IMAGE INITIAL (PI) SKULL BASE TO THIGH performed on 04/16/2023 Mild abnormal radiotracer uptake seen along the 12 mm spiculated left upper lobe lung nodule as seen on prior CT scan. A malignant process is in the differential. In addition there is some abnormal uptake along adjacent pleural thickening and some ground-glass in the left upper lobe. Overall recommend further evaluation. No additional areas of abnormal tracer uptake to suggest other areas of disease. Advanced emphysematous lung changes.  Diffuse vascular calcifications.  Gallstones.  Colonic diverticula.  Enlarged prostate.  Wall thickening the urinary bladder.  Punctate non-obstructing right-sided renal stone   TRANSTHORACIC ECHOCARDIOGRAM performed on 12/11/2022 Left ventricular ejection fraction, by estimation, is 60 to 65%. The left ventricle has normal function. The left ventricle has no regional wall motion abnormalities. Left ventricular diastolic parameters are consistent with Grade I diastolic dysfunction (impaired relaxation).  Right ventricular systolic function is normal. The right ventricular size is normal. Tricuspid regurgitation signal is inadequate for  assessing  PA pressure.   The mitral valve is normal in structure. Mild mitral valve regurgitation. No evidence of mitral stenosis.  Tricuspid valve regurgitation is mild to moderate.  The aortic valve has an indeterminant number of cusps. There is mild calcification of the aortic valve. Aortic valve regurgitation is not visualized. Aortic valve sclerosis/calcification is present, without any evidence of aortic stenosis. Aortic valve mean gradient measures 4.0 mmHg.  The inferior vena cava is normal in size with greater than 50% respiratory variability, suggesting right atrial pressure of 3 mmHg.    US  AORTA performed on 09/18/2022 Ectatic abdominal aorta, 2.6 cm. Five year follow up is recommended.    IMPRESSION AND PLAN: Maxwell Young has been referred for pre-anesthesia review and clearance prior to him undergoing the planned anesthetic and procedural courses. Available labs, pertinent testing, and imaging results were personally reviewed by me in preparation for upcoming operative/procedural course. [facility] medical record has been updated following extensive record review and patient interview with PAT staff.   This patient has been appropriately cleared by cardiology with an overall ACCEPTABLE risk of patient experiencing significant perioperative cardiovascular complications. Based on clinical review performed today (09/27/23), barring any significant acute changes in the patient's overall condition, it is anticipated that he will be able to proceed with the planned surgical intervention. Any acute changes in clinical condition may necessitate his procedure being postponed and/or cancelled. Patient will meet with anesthesia team (MD and/or CRNA) on the day of his procedure for preoperative evaluation/assessment. Questions regarding anesthetic course will be fielded at that time.   Pre-surgical instructions were reviewed with the patient during his PAT appointment, and questions were  fielded to satisfaction by PAT clinical staff. He has been instructed on which medications that he will need to hold prior to surgery, as well as the ones that have been deemed safe/appropriate to take on the day of his procedure. As part of the general education provided by PAT, patient made aware both verbally and in writing, that he would need to  abstain from the use of any illegal substances during his perioperative course. He was advised that failure to follow the provided instructions could necessitate case cancellation or result in serious perioperative complications up to and including death. Patient encouraged to contact PAT and/or his surgeon's office to discuss any questions or concerns that may arise prior to surgery; verbalized understanding.   Dorise Pereyra, MSN, APRN, FNP-C, CEN [facility]  Perioperative Services Nurse Practitioner Phone: [phone] Fax: [phone] 09/27/23 2:20 PM  NOTE: This note has been prepared using Dragon dictation software. Despite my best ability to proofread, there is always the potential that unintentional transcriptional errors may still occur from this process.

## 2023-09-29 ENCOUNTER — Other Ambulatory Visit: Payer: Self-pay

## 2023-09-29 ENCOUNTER — Ambulatory Visit
Admission: RE | Admit: 2023-09-29 | Discharge: 2023-09-29 | Disposition: A | Discharge status: Home / Self Care | Attending: Pulmonary Disease | Admitting: Pulmonary Disease

## 2023-09-29 ENCOUNTER — Ambulatory Visit: Payer: Self-pay | Admitting: Urgent Care

## 2023-09-29 ENCOUNTER — Ambulatory Visit

## 2023-09-29 ENCOUNTER — Encounter: Payer: Self-pay | Admitting: Pulmonary Disease

## 2023-09-29 ENCOUNTER — Encounter
Admission: RE | Disposition: A | Discharge status: Home / Self Care | Payer: Self-pay | Source: Home / Self Care | Attending: Pulmonary Disease

## 2023-09-29 DIAGNOSIS — K219 Gastro-esophageal reflux disease without esophagitis: Secondary | ICD-10-CM | POA: Insufficient documentation

## 2023-09-29 DIAGNOSIS — Z79899 Other long term (current) drug therapy: Secondary | ICD-10-CM | POA: Diagnosis not present

## 2023-09-29 DIAGNOSIS — Z7984 Long term (current) use of oral hypoglycemic drugs: Secondary | ICD-10-CM | POA: Insufficient documentation

## 2023-09-29 DIAGNOSIS — J439 Emphysema, unspecified: Secondary | ICD-10-CM | POA: Insufficient documentation

## 2023-09-29 DIAGNOSIS — I2510 Atherosclerotic heart disease of native coronary artery without angina pectoris: Secondary | ICD-10-CM | POA: Diagnosis not present

## 2023-09-29 DIAGNOSIS — Z860100 Personal history of colon polyps, unspecified: Secondary | ICD-10-CM

## 2023-09-29 DIAGNOSIS — K5730 Diverticulosis of large intestine without perforation or abscess without bleeding: Secondary | ICD-10-CM | POA: Diagnosis not present

## 2023-09-29 DIAGNOSIS — C3412 Malignant neoplasm of upper lobe, left bronchus or lung: Secondary | ICD-10-CM | POA: Diagnosis not present

## 2023-09-29 DIAGNOSIS — Z7982 Long term (current) use of aspirin: Secondary | ICD-10-CM | POA: Diagnosis not present

## 2023-09-29 DIAGNOSIS — E1151 Type 2 diabetes mellitus with diabetic peripheral angiopathy without gangrene: Secondary | ICD-10-CM | POA: Diagnosis not present

## 2023-09-29 DIAGNOSIS — R911 Solitary pulmonary nodule: Secondary | ICD-10-CM | POA: Diagnosis not present

## 2023-09-29 DIAGNOSIS — Z951 Presence of aortocoronary bypass graft: Secondary | ICD-10-CM | POA: Diagnosis not present

## 2023-09-29 DIAGNOSIS — I10 Essential (primary) hypertension: Secondary | ICD-10-CM | POA: Insufficient documentation

## 2023-09-29 DIAGNOSIS — E1169 Type 2 diabetes mellitus with other specified complication: Secondary | ICD-10-CM

## 2023-09-29 DIAGNOSIS — Z01812 Encounter for preprocedural laboratory examination: Secondary | ICD-10-CM

## 2023-09-29 DIAGNOSIS — Z87891 Personal history of nicotine dependence: Secondary | ICD-10-CM | POA: Insufficient documentation

## 2023-09-29 DIAGNOSIS — R846 Abnormal cytological findings in specimens from respiratory organs and thorax: Secondary | ICD-10-CM | POA: Diagnosis not present

## 2023-09-29 HISTORY — PX: VIDEO BRONCHOSCOPY WITH ENDOBRONCHIAL NAVIGATION: SHX6175

## 2023-09-29 HISTORY — PX: ENDOBRONCHIAL ULTRASOUND: SHX5096

## 2023-09-29 HISTORY — DX: Other intervertebral disc degeneration, thoracic region: M51.34

## 2023-09-29 LAB — GLUCOSE, CAPILLARY
Glucose-Capillary: 166 mg/dL — ABNORMAL HIGH (ref 70–99)
Glucose-Capillary: 164 mg/dL — ABNORMAL HIGH (ref 70–99)

## 2023-09-29 SURGERY — VIDEO BRONCHOSCOPY WITH ENDOBRONCHIAL NAVIGATION
Anesthesia: General | Laterality: Left

## 2023-09-29 MED ORDER — MIDAZOLAM HCL 2 MG/2ML IJ SOLN
INTRAMUSCULAR | Status: DC | PRN
  Administered 2023-09-29: 2 mg via INTRAVENOUS

## 2023-09-29 MED ORDER — MIDAZOLAM HCL 2 MG/2ML IJ SOLN
INTRAMUSCULAR | Status: AC
  Filled 2023-09-29: qty 2

## 2023-09-29 MED ORDER — OXYCODONE HCL 5 MG PO TABS: 5.0000 mg | ORAL_TABLET | Freq: Once | ORAL | Status: DC | PRN

## 2023-09-29 MED ORDER — IPRATROPIUM-ALBUTEROL 0.5-2.5 (3) MG/3ML IN SOLN
3.0000 mL | Freq: Once | RESPIRATORY_TRACT | Status: AC
  Administered 2023-09-29: 3 mL via RESPIRATORY_TRACT

## 2023-09-29 MED ORDER — PHENYLEPHRINE HCL-NACL 20-0.9 MG/250ML-% IV SOLN
INTRAVENOUS | Status: AC
  Filled 2023-09-29: qty 250

## 2023-09-29 MED ORDER — ROCURONIUM BROMIDE 10 MG/ML (PF) SYRINGE
PREFILLED_SYRINGE | INTRAVENOUS | Status: AC
  Filled 2023-09-29: qty 10

## 2023-09-29 MED ORDER — LIDOCAINE HCL (CARDIAC) PF 100 MG/5ML IV SOSY
PREFILLED_SYRINGE | INTRAVENOUS | Status: DC | PRN
  Administered 2023-09-29: 100 mg via INTRAVENOUS

## 2023-09-29 MED ORDER — PROPOFOL 500 MG/50ML IV EMUL
INTRAVENOUS | Status: DC | PRN
  Administered 2023-09-29: 150 ug/kg/min via INTRAVENOUS

## 2023-09-29 MED ORDER — PROPOFOL 10 MG/ML IV BOLUS
INTRAVENOUS | Status: AC
  Filled 2023-09-29: qty 20

## 2023-09-29 MED ORDER — PROPOFOL 10 MG/ML IV BOLUS
INTRAVENOUS | Status: DC | PRN
  Administered 2023-09-29: 150 mg via INTRAVENOUS

## 2023-09-29 MED ORDER — PROPOFOL 1000 MG/100ML IV EMUL
INTRAVENOUS | Status: AC
  Filled 2023-09-29: qty 100

## 2023-09-29 MED ORDER — IPRATROPIUM-ALBUTEROL 0.5-2.5 (3) MG/3ML IN SOLN
RESPIRATORY_TRACT | Status: AC
  Filled 2023-09-29: qty 3

## 2023-09-29 MED ORDER — PHENYLEPHRINE 80 MCG/ML (10ML) SYRINGE FOR IV PUSH (FOR BLOOD PRESSURE SUPPORT)
PREFILLED_SYRINGE | INTRAVENOUS | Status: DC | PRN
  Administered 2023-09-29: 120 ug via INTRAVENOUS
  Administered 2023-09-29: 160 ug via INTRAVENOUS

## 2023-09-29 MED ORDER — PHENYLEPHRINE HCL-NACL 20-0.9 MG/250ML-% IV SOLN
INTRAVENOUS | Status: DC | PRN
  Administered 2023-09-29: 20 ug/min via INTRAVENOUS

## 2023-09-29 MED ORDER — CHLORHEXIDINE GLUCONATE 0.12 % MT SOLN
OROMUCOSAL | Status: AC
Start: 2023-09-29 — End: 2023-09-29
  Filled 2023-09-29: qty 15

## 2023-09-29 MED ORDER — OXYCODONE HCL 5 MG/5ML PO SOLN: 5.0000 mg | Freq: Once | ORAL | Status: DC | PRN

## 2023-09-29 MED ORDER — ONDANSETRON HCL 4 MG/2ML IJ SOLN
INTRAMUSCULAR | Status: DC | PRN
  Administered 2023-09-29: 4 mg via INTRAVENOUS

## 2023-09-29 MED ORDER — FENTANYL CITRATE (PF) 100 MCG/2ML IJ SOLN
INTRAMUSCULAR | Status: DC | PRN
  Administered 2023-09-29 (×2): 50 ug via INTRAVENOUS

## 2023-09-29 MED ORDER — DEXAMETHASONE SODIUM PHOSPHATE 10 MG/ML IJ SOLN
INTRAMUSCULAR | Status: DC | PRN
  Administered 2023-09-29: 10 mg via INTRAVENOUS

## 2023-09-29 MED ORDER — LIDOCAINE HCL (PF) 2 % IJ SOLN
INTRAMUSCULAR | Status: AC
  Filled 2023-09-29: qty 5

## 2023-09-29 MED ORDER — EPHEDRINE SULFATE-NACL 50-0.9 MG/10ML-% IV SOSY
PREFILLED_SYRINGE | INTRAVENOUS | Status: DC | PRN
  Administered 2023-09-29: 5 mg via INTRAVENOUS
  Administered 2023-09-29: 10 mg via INTRAVENOUS
  Administered 2023-09-29: 5 mg via INTRAVENOUS

## 2023-09-29 MED ORDER — SUGAMMADEX SODIUM 200 MG/2ML IV SOLN
INTRAVENOUS | Status: DC | PRN
  Administered 2023-09-29: 200 mg via INTRAVENOUS

## 2023-09-29 MED ORDER — LACTATED RINGERS IV SOLN: INTRAVENOUS | Status: DC | PRN

## 2023-09-29 MED ORDER — FENTANYL CITRATE (PF) 100 MCG/2ML IJ SOLN: 25.0000 ug | INTRAMUSCULAR | Status: DC | PRN

## 2023-09-29 MED ORDER — ROCURONIUM BROMIDE 100 MG/10ML IV SOLN
INTRAVENOUS | Status: DC | PRN
  Administered 2023-09-29: 20 mg via INTRAVENOUS
  Administered 2023-09-29: 50 mg via INTRAVENOUS

## 2023-09-29 MED ORDER — FENTANYL CITRATE (PF) 100 MCG/2ML IJ SOLN
INTRAMUSCULAR | Status: AC
Start: 2023-09-29 — End: 2023-09-29
  Filled 2023-09-29: qty 2

## 2023-09-29 NOTE — Procedures (Addendum)
 Date of Procedure: 09/01/2023    Pre-op Diagnosis: Left upper lobe lung nodules, enlarging, prior negative biopsy  Post-op Diagnosis: Same as above   Surgeon: [name], MD  Assistants: Greig Perry, RRT/Rebecca Roddie, Endo Tech  Fluoroscopy technician: Joesph Ahle, RTR   Anesthesia: General endotracheal anesthesia, see anesthesia record   Operation: Bronchoscopy with robotic assistance and biopsies.   Estimated Blood Loss: Minimal   Complications: None  Procedure Note:  Clinical indication Evaluation of enlarging previously detected peripheral pulmonary nodules in the's LEFT upper lobe, identified on a recent follow-up FDG-PET/CT.   Technique Intraoperative Cone Beam Computed Tomography (CBCT) images were acquired using a GE [employer] OEC 3D system, optimized for thoracic imaging. A high-quality 30-second acquisition protocol was used. The patient was positioned supine on the examination table. Pre-biopsy CBCT scans were performed to confirm the lesion's visibility and to determine the optimal approach. Navigation was performed using an [employer] (Intuitive). Repeat CBCT scans were performed to confirm tool-in-lesion placement and to assess for complications.  Findings LEFT upper Lobe Peripheral Pulmonary Nodules: A solid nodule, measuring approximately 18 x 18 mm in diameter, is identified in the left upper lobe. The nodule exhibits a subsolid appearance.  Growth noted from 13 x 10 mm prior. Bronchial Anatomy: Patent airways are visualized up to the segmental bronchi leading towards the target nodule. Navigation: The Ion bronchoscopic navigation successfully acquired the target and guided biopsy tools towards the right upper lobe nodule. Tool-in-Lesion Confirmation: Intraoperative CBCT confirmed the accurate positioning of the biopsy needle within the nodule. Complications: No immediate complications such as pneumothorax or hemorrhage were observed  during or immediately following the procedure.  A second target in the LEFT upper lobe was identified just lateral to the lesion with size unable to be approximated. Procedure was repeated as above with navigation to this area and intraoperative CBCT to and lesion confirmation performed.  Impression CBCT-guided navigation bronchoscopy with biopsy was performed for the peripheral pulmonary nodule in the LEFT upper lobe. The procedure was technically successful with confirmed tool-in-lesion placement. Samples were obtained for histological and cytological examination.   Recommendations Submit biopsy samples for histopathological and cytological analysis. Images were uploaded to PACS. Schedule follow-up appointment with the referring physician to discuss the results and treatment plan. Monitor for delayed complications such as pneumothorax or hemorrhage.   Total fluoroscopy time: 4 minutes and 32 seconds, total dose 192 mGy.  Please see associated operative report for robotic bronchoscopy description.  [name], MD Advanced Bronchoscopy PCCM  [facility]    *This note was generated using voice recognition software/Dragon and/or AI transcription program.  Despite best efforts to proofread, errors can occur which can change the meaning. Any transcriptional errors that result from this process are unintentional and may not be fully corrected at the time of dictation.

## 2023-09-29 NOTE — Addendum Note (Signed)
 Addendum  created 09/29/23 1337 by Brien Sotero PARAS, CRNA   Intraprocedure Meds edited

## 2023-09-29 NOTE — Discharge Instructions (Signed)
 Flexible Bronchoscopy, Care After This sheet gives you information about how to care for yourself after your test. Your doctor may also give you more specific instructions. If you have problems or questions, contact your doctor. Follow these instructions at home: Eating and drinking When you are wide awake, your numbness is gone and your cough and gag reflexes have come back, you may: Start eating only soft foods. Slowly drink liquids. Six hours after the test, go back to your normal diet. Driving Do not drive for 24 hours if you were given a medicine to help you relax (sedative). Do not drive or use heavy machinery while taking prescription pain medicine. General instructions Take over-the-counter and prescription medicines only as told by your doctor. Return to your normal activities as told. Ask what activities are safe for you. Do not use any products that have nicotine or tobacco in them. This includes cigarettes and e-cigarettes. If you need help quitting, ask your doctor. Keep all follow-up visits as told by your doctor. This is important. It is very important if you had a tissue sample (biopsy) taken. Get help right away if: You have shortness of breath that gets worse. You get light-headed. You feel like you are going to pass out (faint). You have chest pain. You cough up: More than a little blood. More blood than before. Summary Do not use cigarettes. Do not use e-cigarettes. Seek care in the Emergency Department right away if you have chest pain or shortness of breath. Call or MyChart Message our office for any questions or problems at [phone].    This information is not intended to replace advice given to you by your health care provider. Make sure you discuss any questions you have with your health care provider.

## 2023-09-29 NOTE — Anesthesia Postprocedure Evaluation (Signed)
 Anesthesia Post Note  Patient: Maxwell Young  Procedure(s) Performed: VIDEO BRONCHOSCOPY WITH ENDOBRONCHIAL NAVIGATION (Left) ENDOBRONCHIAL ULTRASOUND (EBUS) (Left)  Patient location during evaluation: Endoscopy Anesthesia Type: General Level of consciousness: awake and alert Pain management: pain level controlled Vital Signs Assessment: post-procedure vital signs reviewed and stable Respiratory status: spontaneous breathing, nonlabored ventilation, respiratory function stable and patient connected to nasal cannula oxygen Cardiovascular status: blood pressure returned to baseline and stable Postop Assessment: no apparent nausea or vomiting Anesthetic complications: no   No notable events documented.   Last Vitals:  Vitals:   09/29/23 1116 09/29/23 1130  BP: (!) 102/59 113/73  Pulse: (!) 55 (!) 54  Resp: 16 17  Temp:  (!) 36.2 C  SpO2: 97% 98%    Last Pain:  Vitals:   09/29/23 1130  TempSrc:   PainSc: 0-No pain                 [name]

## 2023-09-29 NOTE — Anesthesia Preprocedure Evaluation (Signed)
 Anesthesia Evaluation  Patient identified by MRN, date of birth, ID band Patient awake    Reviewed: Allergy & Precautions, NPO status , Patient's Chart, lab work & pertinent test results  History of Anesthesia Complications Negative for: history of anesthetic complications  Airway Mallampati: III   Neck ROM: Full    Dental  (+) Upper Dentures, Lower Dentures   Pulmonary COPD, Patient abstained from smoking., former smoker   Pulmonary exam normal breath sounds clear to auscultation       Cardiovascular hypertension, + CAD (s/p CABG) and + Peripheral Vascular Disease  Normal cardiovascular exam Rhythm:Regular Rate:Normal  Echo 12/11/22:  1. Left ventricular ejection fraction, by estimation, is 60 to 65%. The left ventricle has normal function. The left ventricle has no regional wall motion abnormalities. Left ventricular diastolic parameters are consistent with Grade I diastolic dysfunction (impaired relaxation).   2. Right ventricular systolic function is normal. The right ventricular size is normal. Tricuspid regurgitation signal is inadequate for assessing PA pressure.   3. The mitral valve is normal in structure. Mild mitral valve regurgitation. No evidence of mitral stenosis.   4. Tricuspid valve regurgitation is mild to moderate.   5. The aortic valve has an indeterminant number of cusps. There is mild calcification of the aortic valve. Aortic valve regurgitation is not visualized. Aortic valve sclerosis/calcification is present, without any evidence of aortic stenosis. Aortic valve mean gradient measures 4.0 mmHg.   6. The inferior vena cava is normal in size with greater than 50% respiratory variability, suggesting right atrial pressure of 3 mmHg.   ECG 11/27/22:  Normal sinus rhythm Right bundle branch block Left posterior fasicular block Bifascicular block Abnormal ECG When compared with ECG of 12-Sep-2022 No significant  change was found   Neuro/Psych negative neurological ROS     GI/Hepatic ,GERD  Medicated and Controlled,,  Endo/Other  diabetes, Type 2    Renal/GU      Musculoskeletal   Abdominal   Peds  Hematology negative hematology ROS (+)   Anesthesia Other Findings Reviewed and agree with Dorise Boor pre-anesthesia clinical review note.   Reproductive/Obstetrics                              Anesthesia Physical Anesthesia Plan  ASA: 3  Anesthesia Plan: General   Post-op Pain Management:    Induction: Intravenous  PONV Risk Score and Plan: 2 and Ondansetron , Dexamethasone  and Treatment may vary due to age or medical condition  Airway Management Planned: Oral ETT  Additional Equipment:   Intra-op Plan:   Post-operative Plan: Extubation in OR  Informed Consent: I have reviewed the patients History and Physical, chart, labs and discussed the procedure including the risks, benefits and alternatives for the proposed anesthesia with the patient or authorized representative who has indicated his/her understanding and acceptance.     Dental advisory given  Plan Discussed with: CRNA  Anesthesia Plan Comments: (Patient consented for risks of anesthesia including but not limited to:  - adverse reactions to medications - damage to eyes, teeth, lips or other oral mucosa - nerve damage due to positioning  - sore throat or hoarseness - damage to heart, brain, nerves, lungs, other parts of body or loss of life  Informed patient about role of CRNA in peri- and intra-operative care.  Patient voiced understanding.)         Anesthesia Quick Evaluation

## 2023-09-29 NOTE — Interval H&P Note (Signed)
 Maxwell Young has presented today for surgery, with the diagnosis of LEFT UPPER LOBE LUNG NODULES.  The various methods of treatment have been discussed with the patient and family. After consideration of risks, benefits and other options for treatment, the patient has consented to  Procedure(s): ROBOTIC ASSISTED NAVIGATIONAL BRONCHOSCOPY AND ENDOBRONCHIAL ULTRASOUND-LEFT as a surgical intervention.  The patient's history has been reviewed, patient examined, no change in status, stable for surgery.  I have reviewed the patient's chart and labs.  Questions were answered to the patient's satisfaction.  Benefits, limitations and potential complications of the procedure were discussed with the patient/family.  Complications from bronchoscopy are rare and most often minor, but if they occur they may include breathing difficulty, vocal cord spasm, hoarseness, slight fever, vomiting, dizziness, bronchospasm, infection, low blood oxygen, bleeding from biopsy site, or an allergic reaction to medications.  It is uncommon for patients to experience other more serious complications for example: Collapsed lung requiring chest tube placement, respiratory failure, heart attack and/or cardiac arrhythmia.  Patient understands he is higher risk of pneumothorax due to existing emphysema.  Patient agrees to proceed.  Maxwell Leita Sanders, MD Advanced Bronchoscopy PCCM [facility]    *This note was generated using voice recognition software/Dragon and/or AI transcription program.  Despite best efforts to proofread, errors can occur which can change the meaning. Any transcriptional errors that result from this process are unintentional and may not be fully corrected at the time of dictation.

## 2023-09-29 NOTE — Op Note (Addendum)
 Video Bronchoscopy with Robotic Assisted Bronchoscopic Navigation   Date of Operation: 09/29/2023   Pre-op Diagnosis: Left upper lobe nodules, subsolid, enlarging, prior negative robotic bronchoscopy on 26 May 2023.  Rule out cancer.  Post-op Diagnosis: Same as above.  Surgeon: [name], MD  Assistants: Greig Perry, RRT/Rebecca Roddie, Endo Tech  Anesthesia: General endotracheal anesthesia  Operation: Flexible video fiberoptic bronchoscopy with robotic assistance and biopsies.  Estimated Blood Loss: Minimal  Complications: None  Indications and History: Maxwell Young is a 68 y.o. male with history of significant emphysema in the setting of prior tobacco abuse with enlarging subsolid lesions on the left upper lobe with prior negative robotic bronchoscopy.  Recommendation made to achieve a tissue diagnosis via robotic assisted navigational bronchoscopy.  The risks, benefits, complications, treatment options and expected outcomes were discussed with the patient.  The possibilities of pneumothorax, pneumonia, reaction to medication, pulmonary aspiration, perforation of a viscus, bleeding, failure to diagnose a condition and creating a complication requiring transfusion or operation were discussed with the patient who freely signed the consent.    Description of Procedure: The patient was seen in the Preoperative Area, was examined and was deemed appropriate to proceed.  The patient was taken to the [facility] OR Procedure Room 2 ( Suite), identified as [name] and the procedure verified as Video Bronchoscopy with Robotic Assisted Navigational Protocol.  A Time Out was held and the above information confirmed.   Prior to the date of the procedure a high-resolution CT scan of the chest was performed. Utilizing ION software program a virtual tracheobronchial tree was generated to allow the creation of distinct navigation pathways to the patient's parenchymal abnormalities. After being  taken to the operating room general anesthesia was initiated and the patient  was orally intubated. The video fiberoptic bronchoscope was introduced via the endotracheal tube and a general inspection was performed which showed normal right and left lung anatomy. Aspiration of the bilateral mainstems was completed to remove any remaining secretions. Robotic catheter inserted into patient's endotracheal tube.   Target #1 left upper lobe anterior: The distinct navigation pathways prepared prior to this procedure were then utilized to navigate to patient's lesion identified on CT scan. The robotic catheter was secured into place and the vision probe was withdrawn.  Lesion location was approximated using fluoroscopy.  Local registration and targeting was performed using GE 3D CBCT mobile C-arm three-dimensional imaging. Under fluoroscopic guidance transbronchial needle aspirates were performed to be sent for cytology and pathology.  Needle-in-lesion was confirmed using GE 3D CBCT mobile C-arm.  Radial EBUS was performed during sample acquisition to verify target acquisition.  Material obtained was placed in the appropriate preservative for further analysis.  Target #2 left upper lobe below target 1: The distinct navigation pathways prepared prior to this procedure were then utilized to navigate to patient's lesion identified on CT scan. The robotic catheter was secured into place and the vision probe was withdrawn.  Lesion location was approximated using fluoroscopy.  Local registration and targeting was performed using GE 3D CBCT mobile C-arm three-dimensional imaging. Under fluoroscopic guidance transbronchial forceps biopsies were performed to be sent for cytology and pathology.  Tool-in-lesion was confirmed using GE 3D CBCT mobile C arm.  A a targeted bronchioalveolar lavage was performed in this left upper lobe subsegment and sent for cytology and microbiology.  Radial EBUS was utilized during sample  acquisition to verify target acquisition.  Once the robotic portion of the procedure had been completed the  robotic bronchoscopic catheter was removed and an endobronchial ultrasound scope was introduced and the mediastinum was examined.  There was no significant adenopathy noted to warrant biopsy.  At the end of the procedure a general airway inspection with the Olympus video bronchoscope was performed and there was no evidence of active bleeding. The bronchoscope was removed.  The patient tolerated the procedure well. There was no significant blood loss and there were no obvious complications. A post-procedural chest x-ray showed no evidence of pneumothorax.  Samples Target #1:NA 1. Transbronchial needle brushings from N/A 2. Transbronchial needle biopsies from LUL x 6 3. Transbronchial forceps biopsies from N/A 4. Bronchoalveolar lavage from N/A 5. Endobronchial biopsies from N/A  Samples Target #2: 1. Transbronchial needle brushings from N/A 2. Transbronchial needle biopsies from N/A 3. Transbronchial forceps biopsies from LUL x 6 4. Bronchoalveolar lavage from LUL, 5 mL aliquot for microbiology 5. Endobronchial biopsies from N/A   Impression: Enlarging left upper lobe nodules, subsolid, query adenocarcinoma Status post technically successful robotic assisted navigational bronchoscopy  Plan:  Await pathology reports Patient has appropriate pulmonary follow-up    C. Leita Sanders, MD Advanced Bronchoscopy PCCM [facility]    *This note was generated using voice recognition software/Dragon and/or AI transcription program.  Despite best efforts to proofread, errors can occur which can change the meaning. Any transcriptional errors that result from this process are unintentional and may not be fully corrected at the time of dictation.

## 2023-09-29 NOTE — Transfer of Care (Signed)
 Immediate Anesthesia Transfer of Care Note  Patient: Maxwell Young  Procedure(s) Performed: VIDEO BRONCHOSCOPY WITH ENDOBRONCHIAL NAVIGATION (Left) ENDOBRONCHIAL ULTRASOUND (EBUS) (Left)  Patient Location: PACU  Anesthesia Type:General  Level of Consciousness: awake  Airway & Oxygen Therapy: Patient Spontanous Breathing  Post-op Assessment: Report given to RN and Post -op Vital signs reviewed and stable  Post vital signs: Reviewed and stable  Last Vitals:  Vitals Value Taken Time  BP    Temp    Pulse    Resp    SpO2      Last Pain:  Vitals:   09/29/23 0827  TempSrc: Oral         Complications: No notable events documented.

## 2023-09-29 NOTE — Anesthesia Procedure Notes (Signed)
 Procedure Name: Intubation Date/Time: 09/29/2023 9:30 AM  Performed by: Brien Sotero PARAS, CRNAPre-anesthesia Checklist: Patient identified, Patient being monitored, Timeout performed, Emergency Drugs available and Suction available Patient Re-evaluated:Patient Re-evaluated prior to induction Oxygen Delivery Method: Circle system utilized Preoxygenation: Pre-oxygenation with 100% oxygen Induction Type: IV induction Ventilation: Mask ventilation without difficulty Laryngoscope Size: McGrath and 4 Grade View: Grade I Tube type: Oral Tube size: 8.5 mm Number of attempts: 1 Airway Equipment and Method: Stylet Placement Confirmation: ETT inserted through vocal cords under direct vision, positive ETCO2 and breath sounds checked- equal and bilateral Secured at: 21 cm Tube secured with: Tape Dental Injury: Teeth and Oropharynx as per pre-operative assessment

## 2023-09-30 ENCOUNTER — Encounter: Payer: Self-pay | Admitting: Pulmonary Disease

## 2023-09-30 LAB — ACID FAST SMEAR (AFB, MYCOBACTERIA): Acid Fast Smear: NEGATIVE

## 2023-10-01 LAB — SURGICAL PATHOLOGY

## 2023-10-01 LAB — CULTURE, RESPIRATORY W GRAM STAIN
Culture: NO GROWTH
Gram Stain: NONE SEEN

## 2023-10-01 LAB — CYTOLOGY - NON PAP

## 2023-10-02 ENCOUNTER — Encounter: Payer: Self-pay | Admitting: Family Medicine

## 2023-10-02 ENCOUNTER — Telehealth: Payer: Self-pay | Admitting: Acute Care

## 2023-10-02 ENCOUNTER — Ambulatory Visit (INDEPENDENT_AMBULATORY_CARE_PROVIDER_SITE_OTHER): Admitting: Family Medicine

## 2023-10-02 ENCOUNTER — Ambulatory Visit: Payer: Self-pay | Admitting: Pulmonary Disease

## 2023-10-02 VITALS — BP 104/68 | HR 63 | Temp 97.6°F | Ht 72.0 in | Wt 162.2 lb

## 2023-10-02 DIAGNOSIS — C3492 Malignant neoplasm of unspecified part of left bronchus or lung: Secondary | ICD-10-CM

## 2023-10-02 DIAGNOSIS — Z23 Encounter for immunization: Secondary | ICD-10-CM

## 2023-10-02 DIAGNOSIS — I700 Atherosclerosis of aorta: Secondary | ICD-10-CM | POA: Diagnosis not present

## 2023-10-02 DIAGNOSIS — E785 Hyperlipidemia, unspecified: Secondary | ICD-10-CM | POA: Diagnosis not present

## 2023-10-02 DIAGNOSIS — I2510 Atherosclerotic heart disease of native coronary artery without angina pectoris: Secondary | ICD-10-CM | POA: Diagnosis not present

## 2023-10-02 DIAGNOSIS — E1169 Type 2 diabetes mellitus with other specified complication: Secondary | ICD-10-CM | POA: Diagnosis not present

## 2023-10-02 DIAGNOSIS — I10 Essential (primary) hypertension: Secondary | ICD-10-CM | POA: Diagnosis not present

## 2023-10-02 DIAGNOSIS — N401 Enlarged prostate with lower urinary tract symptoms: Secondary | ICD-10-CM

## 2023-10-02 DIAGNOSIS — Z0000 Encounter for general adult medical examination without abnormal findings: Secondary | ICD-10-CM

## 2023-10-02 DIAGNOSIS — Z7984 Long term (current) use of oral hypoglycemic drugs: Secondary | ICD-10-CM

## 2023-10-02 LAB — BAYER DCA HB A1C WAIVED: HB A1C (BAYER DCA - WAIVED): 6.5 % — ABNORMAL HIGH (ref 4.8–5.6)

## 2023-10-02 LAB — MICROALBUMIN, URINE WAIVED
Creatinine, Urine Waived: 50 mg/dL (ref 10–300)
Microalb, Ur Waived: 150 mg/L — ABNORMAL HIGH (ref 0–19)
Microalb/Creat Ratio: >300 mg/g — ABNORMAL HIGH (ref <30)

## 2023-10-02 NOTE — Assessment & Plan Note (Signed)
Will keep his BP and cholesterol and sugars under good control. Continue to monitor. Call with any concerns.  

## 2023-10-02 NOTE — Assessment & Plan Note (Signed)
 Newly diagnosed. Continue to follow with pulmonology. Has an appointment to see thoracic surgery. Call with any concerns. Continue to monitor.

## 2023-10-02 NOTE — Progress Notes (Signed)
 Noted, NFN

## 2023-10-02 NOTE — Progress Notes (Signed)
 Subjective:   Maxwell Young is a 68 y.o. male who presents for Medicare Annual/Subsequent preventive examination.  Visit Complete: In person  Patient Medicare AWV questionnaire was completed by the patient on 10/02/23; I have confirmed that all information answered by patient is correct and no changes since this date.  Cardiac Risk Factors include: advanced age (>24men, >61 women);diabetes mellitus;smoking/ tobacco exposure;hypertension;male gender;dyslipidemia;family history of premature cardiovascular disease     Objective:    Today's Vitals   10/02/23 1407  BP: 104/68  Pulse: 63  Temp: 97.6 F (36.4 C)  TempSrc: Oral  SpO2: 96%  Weight: 162 lb 3.2 oz (73.6 kg)  Height: 6' (1.829 m)  PainSc: 0-No pain   Body mass index is 22 kg/m.     09/29/2023    8:20 AM 09/23/2023    2:22 PM 05/26/2023   11:19 AM 05/19/2023    3:38 PM 01/07/2023    6:22 AM 01/02/2023    2:52 PM 09/14/2020    7:10 AM  Advanced Directives  Does Patient Have a Medical Advance Directive? No No No No No No No  Would patient like information on creating a medical advance directive? No - Patient declined No - Patient declined No - Patient declined  No - Patient declined      Current Medications (verified) Outpatient Encounter Medications as of 10/02/2023  Medication Sig   amLODipine  (NORVASC ) 10 MG tablet Take 1 tablet (10 mg total) by mouth daily.   aspirin EC 81 MG tablet Take 81 mg by mouth daily. Swallow whole.   metFORMIN  (GLUCOPHAGE ) 500 MG tablet Take 2 tablets (1,000 mg total) by mouth 2 (two) times daily with a meal.   metoprolol  succinate (TOPROL -XL) 100 MG 24 hr tablet Take 1 tablet (100 mg total) by mouth daily. Take with or immediately following a meal.   Multiple Vitamins-Minerals (MULTIVITAMIN ADULT PO) Take 1 tablet by mouth daily.   omega-3 acid ethyl esters (LOVAZA ) 1 g capsule TAKE 2 CAPSULES TWICE A DAY   omeprazole  (PRILOSEC) 20 MG capsule Take 1 capsule (20 mg total) by mouth daily.    rosuvastatin  (CRESTOR ) 40 MG tablet Take 1 tablet (40 mg total) by mouth daily.   tamsulosin  (FLOMAX ) 0.4 MG CAPS capsule Take 2 capsules (0.8 mg total) by mouth daily.   Blood Glucose Monitoring Suppl (CONTOUR NEXT ONE) KIT 1 each by Does not apply route in the morning and at bedtime. (Patient not taking: Reported on 10/02/2023)   glucose blood test strip USE DAILY TO CHECK BLOOD SUGAR. (Patient not taking: Reported on 10/02/2023)   Lancets (ONETOUCH ULTRASOFT) lancets USE DAILY TO CHECK BLOOD SUGAR. (Patient not taking: Reported on 10/02/2023)   No facility-administered encounter medications on file as of 10/02/2023.    Allergies (verified) Patient has no known allergies.   History: Past Medical History:  Diagnosis Date   Abdominal aortic ectasia (HCC) 08/29/2022   a.) US  aorta 08/29/2022: 2.6 cm   Aortic atherosclerosis (HCC)    Bilateral inguinal hernia    BPH with elevated PSA and lower urinary tract symptoms    CAD (coronary artery disease) 11/13/2006   a.) LHC 11/13/2006: 90% mLAD, 70% D1, 60% D2, 70% pLCx, 100% pRCA, 95% RV marginal - refer to CVTS; b.) s/p 3v CABG 11/21/2006   Cholelithiasis    Colon polyps 01/21/2009   DDD (degenerative disc disease), cervical    a.) s/p C4-C6 discectomy with interbody fusion and anterior plating 06/29/2001   DDD (degenerative disc disease), thoracic  Diastolic dysfunction    a.) TTE 12/11/2022: EF 60-65%, no RWMAs, G1DD, norm RVSF, mild MR, mild-mod TR, AoV sclerosis without stenosis.   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    S/P CABG x 3 11/21/2006   a.) LIMA-LAD, SVG-OM2, SVG-PDA   T2DM (type 2 diabetes mellitus) (HCC)    Vapes nicotine containing substance    Past Surgical History:  Procedure Laterality Date   BRONCHOSCOPY, WITH BIOPSY USING ELECTROMAGNETIC NAVIGATION Left 05/26/2023   Procedure: BRONCHOSCOPY, WITH BIOPSY USING ELECTROMAGNETIC NAVIGATION;  Surgeon: Tamea Dedra CROME, MD;  Location: [facility];  Service: Pulmonary;  Laterality: Left;   CARDIAC CATHETERIZATION     CATARACT EXTRACTION W/ INTRAOCULAR LENS IMPLANT Right 03/04/2022   CERVICAL FUSION N/A 06/29/2001   COLONOSCOPY     COLONOSCOPY N/A 09/14/2020   Procedure: COLONOSCOPY;  Surgeon: Jinny Carmine, MD;  Location: [facility];  Service: Endoscopy;  Laterality: N/A;   COLONOSCOPY WITH PROPOFOL  N/A 07/24/2015   Procedure: COLONOSCOPY WITH PROPOFOL ;  Surgeon: Carmine Jinny, MD;  Location: [facility];  Service: Endoscopy;  Laterality: N/A;   CORONARY ARTERY BYPASS GRAFT  11/21/2006   Procedure: CORONARY ARTERY BYPASS GRAFT; Location: [facility]; Surgeon: Lajoyce Farr, MD   ENDOBRONCHIAL ULTRASOUND Left 09/29/2023   Procedure: ENDOBRONCHIAL ULTRASOUND (EBUS);  Surgeon: Tamea Dedra CROME, MD;  Location: [facility];  Service: Pulmonary;  Laterality: Left;   INSERTION OF MESH  01/07/2023   Procedure: INSERTION OF MESH;  Surgeon: Jordis Laneta FALCON, MD;  Location: [facility];  Service: General;;   LEFT HEART CATH AND CORONARY ANGIOGRAPHY Left 11/13/2006   Procedure: LEFT HEART CATH AND CORONARY ANGIOGRAPHY; Location: [facility]; Surgeon: Denyse Bathe, MD   POLYPECTOMY  07/24/2015   Procedure: POLYPECTOMY;  Surgeon: Carmine Jinny, MD;  Location: [facility];  Service: Endoscopy;;   VIDEO BRONCHOSCOPY WITH ENDOBRONCHIAL NAVIGATION Left 09/29/2023   Procedure: VIDEO BRONCHOSCOPY WITH ENDOBRONCHIAL NAVIGATION;  Surgeon: Tamea Dedra CROME, MD;  Location: [facility];   Service: Pulmonary;  Laterality: Left;   VIDEO BRONCHOSCOPY WITH ENDOBRONCHIAL ULTRASOUND Left 05/26/2023   Procedure: BRONCHOSCOPY, WITH EBUS;  Surgeon: Tamea Dedra CROME, MD;  Location: [facility];  Service: Pulmonary;  Laterality: Left;   Family History  Problem Relation Age of Onset   Hypertension Mother    Heart disease Mother        CAD   Heart failure Mother    Cancer Father        colon, prostate   Stroke Father    Parkinson's disease Father    Social History   Socioeconomic History   Marital status: Married    Spouse name: Toto,Lisa (Spouse)   Number of children: Not on file   Years of education: Not on file   Highest education level: Associate degree: occupational, Scientist, product/process development, or vocational program  Occupational History   Not on file  Tobacco Use   Smoking status: Former    Current  packs/day: 0.00    Average packs/day: 1.5 packs/day for 45.8 years (68.7 ttl pk-yrs)    Types: E-cigarettes, Cigarettes    Start date: 30    Quit date: 11/21/2021    Years since quitting: 1.8    Passive exposure: Past   Smokeless tobacco: Never   Tobacco comments:    Currently vapes.  Vaping Use   Vaping status: Every Day   Start date: 12/21/2021   Substances: Nicotine, Flavoring   Devices: mint flavor  Substance and Sexual Activity   Alcohol use: No   Drug use: No   Sexual activity: Yes  Other Topics Concern   Not on file  Social History Narrative   Not on file   Social Drivers of Health   Financial Resource Strain: Low Risk  (07/06/2023)   Overall Financial Resource Strain (CARDIA)    Difficulty of Paying Living Expenses: Not very hard  Food Insecurity: No Food Insecurity (07/06/2023)   Hunger Vital Sign    Worried About Running Out of Food in the Last Year: Never true    Ran Out of Food in the Last Year: Never true  Transportation Needs: No Transportation Needs (07/06/2023)   PRAPARE - Administrator, Civil Service (Medical): No    Lack of Transportation  (Non-Medical): No  Physical Activity: Insufficiently Active (07/06/2023)   Exercise Vital Sign    Days of Exercise per Week: 3 days    Minutes of Exercise per Session: 20 min  Stress: No Stress Concern Present (07/06/2023)   [employer] of Occupational Health - Occupational Stress Questionnaire    Feeling of Stress: Not at all  Social Connections: Moderately Integrated (07/06/2023)   Social Connection and Isolation Panel    Frequency of Communication with Friends and Family: More than three times a week    Frequency of Social Gatherings with Friends and Family: Three times a week    Attends Religious Services: More than 4 times per year    Active Member of Clubs or Organizations: No    Attends Banker Meetings: Not on file    Marital Status: Married    Tobacco Counseling Counseling given: Not Answered Tobacco comments: Currently vapes.   Clinical Intake:     Pain Score: 0-No pain                  Activities of Daily Living    10/02/2023    2:11 PM 09/29/2023    8:14 AM  In your present state of health, do you have any difficulty performing the following activities:  Hearing? 0 0  Vision? 0 0  Difficulty concentrating or making decisions? 0 0  Walking or climbing stairs? 0   Dressing or bathing? 0   Doing errands, shopping? 0   Preparing Food and eating ? N   Using the Toilet? N   In the past six months, have you accidently leaked urine? N   Do you have problems with loss of bowel control? N   Managing your Medications? N   Managing your Finances? N   Housekeeping or managing your Housekeeping? N     Patient Care Team: Vicci Duwaine SQUIBB, DO as PCP - General (Family Medicine) End, Lonni, MD as PCP - Cardiology (Cardiology)  Indicate any recent Medical Services you may have received from other than [facility] providers in the past year (date may be approximate).     Assessment:   This is a routine wellness examination for  Patric.  Hearing/Vision screen No results found.   Goals Addressed   None    Depression Screen    10/02/2023    2:18 PM 03/31/2023    3:50 PM 12/30/2022    3:20 PM 09/12/2022    9:56 AM 06/11/2022    3:25 PM 03/05/2022    2:42 PM 12/03/2021    9:44 AM  PHQ 2/9 Scores  PHQ - 2 Score 0 0 0 0 0 0 0  PHQ- 9 Score 0  0 0 2 1 3     Fall Risk    10/02/2023    2:16 PM 03/31/2023    3:50 PM 12/30/2022    3:20 PM 09/12/2022    9:56 AM 09/11/2022   11:23 PM  Fall Risk   Falls in the past year? 0 0 0 0 0  Number falls in past yr: 0 0 0 0   Injury with Fall? 0 0 0 0   Risk for fall due to : No Fall Risks No Fall Risks No Fall Risks No Fall Risks   Follow up  Falls evaluation completed Falls evaluation completed Falls evaluation completed     MEDICARE RISK AT HOME: Medicare Risk at Home Any stairs in or around the home?: No If so, are there any without handrails?: No Home free of loose throw rugs in walkways, pet beds, electrical cords, etc?: Yes Adequate lighting in your home to reduce risk of falls?: Yes Life alert?: No Use of a cane, walker or w/c?: No Grab bars in the bathroom?: Yes Shower chair or bench in shower?: Yes Elevated toilet seat or a handicapped toilet?: Yes  TIMED UP AND GO:  Was the test performed?  Yes  Length of time to ambulate 10 feet: 10 sec Gait steady and fast without use of assistive device    Cognitive Function:        10/02/2023    2:14 PM 09/12/2022    9:50 AM  6CIT Screen  What Year? 0 points 0 points  What month? 0 points 0 points  What time? 0 points 0 points  Count back from 20 0 points 0 points  Months in reverse 0 points 0 points  Repeat phrase 0 points 0 points  Total Score 0 points 0 points    Immunizations Immunization History  Administered Date(s) Administered   Fluad Trivalent(High Dose 65+) 12/31/2022   Influenza,inj,Quad PF,6+ Mos 12/14/2014, 01/01/2016, 11/23/2020, 12/03/2021   PFIZER(Purple Top)SARS-COV-2 Vaccination  09/29/2019, 10/27/2019   PNEUMOCOCCAL CONJUGATE-20 06/11/2022   Pneumococcal Polysaccharide-23 08/23/2020   Pneumococcal-Unspecified 04/25/2011   Td 11/23/2020   Tdap 10/04/2010   Zoster Recombinant(Shingrix ) 12/03/2021, 03/05/2022    TDAP status: Up to date  Flu Vaccine status: Completed at today's visit  Pneumococcal vaccine status: Up to date  Covid-19 vaccine status: Declined, Education has been provided regarding the importance of this vaccine but patient still declined. Advised may receive this vaccine at local pharmacy or Health Dept.or vaccine clinic. Aware to provide a copy of the vaccination record if obtained from local pharmacy or Health Dept. Verbalized acceptance and understanding.  Qualifies for Shingles Vaccine? Yes   Zostavax completed No   Shingrix  Completed?: Yes  Screening Tests Health Maintenance  Topic Date Due   Influenza Vaccine  08/22/2023   Diabetic kidney evaluation - Urine ACR  09/12/2023   Medicare Annual Wellness (AWV)  09/18/2023   FOOT EXAM  09/12/2023   HEMOGLOBIN A1C  01/06/2024   OPHTHALMOLOGY EXAM  04/22/2024   Diabetic kidney evaluation -  eGFR measurement  09/25/2024   Colonoscopy  09/14/2025   DTaP/Tdap/Td (3 - Td or Tdap) 11/24/2030   Pneumococcal Vaccine: 50+ Years  Completed   Hepatitis C Screening  Completed   Zoster Vaccines- Shingrix   Completed   HPV VACCINES  Aged Out   Meningococcal B Vaccine  Aged Out   Lung Cancer Screening  Discontinued   COVID-19 Vaccine  Discontinued    Health Maintenance  Health Maintenance Due  Topic Date Due   Influenza Vaccine  08/22/2023   Diabetic kidney evaluation - Urine ACR  09/12/2023   Medicare Annual Wellness (AWV)  09/18/2023   FOOT EXAM  09/12/2023    Colorectal cancer screening: Type of screening: Colonoscopy. Completed 09/14/20. Repeat every 5 years  Lung Cancer Screening: (Low Dose CT Chest recommended if Age 73-80 years, 20 pack-year currently smoking OR have quit w/in 15years.)  does qualify.   Additional Screening:  Hepatitis C Screening: does qualify; Completed 12/14/14  Vision Screening: Recommended annual ophthalmology exams for early detection of glaucoma and other disorders of the eye. Is the patient up to date with their annual eye exam?  Yes  Who is the provider or what is the name of the office in which the patient attends annual eye exams? [facility]  Dental Screening: Recommended annual dental exams for proper oral hygiene  Diabetic Foot Exam: Diabetic Foot Exam: Completed today  Community Resource Referral / Chronic Care Management: CRR required this visit?  No   CCM required this visit?  No     Plan:     I have personally reviewed and noted the following in the patient's chart:   Medical and social history Use of alcohol, tobacco or illicit drugs  Current medications and supplements including opioid prescriptions. Patient is not currently taking opioid prescriptions. Functional ability and status Nutritional status Physical activity Advanced directives List of other physicians Hospitalizations, surgeries, and ER visits in previous 12 months Vitals Screenings to include cognitive, depression, and falls Referrals and appointments  In addition, I have reviewed and discussed with patient certain preventive protocols, quality metrics, and best practice recommendations. A written personalized care plan for preventive services as well as general preventive health recommendations were provided to patient.     Duwaine Louder, DO   10/02/2023   After Visit Summary: (In Person-Printed) AVS printed and given to the patient

## 2023-10-02 NOTE — Progress Notes (Signed)
 BP 104/68 (BP Location: Left Arm, Patient Position: Sitting, Cuff Size: Normal)   Pulse 63   Temp 97.6 F (36.4 C) (Oral)   Ht 6' (1.829 m)   Wt 162 lb 3.2 oz (73.6 kg)   SpO2 96%   BMI 22.00 kg/m    Subjective:    Patient ID: Maxwell Young, male    DOB: 1955/08/13, 68 y.o.   MRN: [id number]  HPI: Maxwell Young is a 68 y.o. male presenting on 10/02/2023 for comprehensive medical examination. Current medical complaints include:  DIABETES Hypoglycemic episodes:no Polydipsia/polyuria: no Visual disturbance: no Chest pain: no Paresthesias: no Glucose Monitoring: no Taking Insulin ?: no Blood Pressure Monitoring: occasionally Retinal Examination: Up to Date Foot Exam: Up to Date Diabetic Education: Completed Pneumovax: Up to Date Influenza: Up to Date Aspirin: yes  HYPERTENSION / HYPERLIPIDEMIA Satisfied with current treatment? yes Duration of hypertension: chronic BP monitoring frequency: occasionally BP medication side effects: no Past BP meds: amlodipine  Duration of hyperlipidemia: chronic Cholesterol medication side effects: no Cholesterol supplements: fish oil Past cholesterol medications: crestor  Medication compliance: excellent compliance Aspirin: yes Recent stressors: no Recurrent headaches: no Visual changes: no Palpitations: no Dyspnea: no Chest pain: no Lower extremity edema: no Dizzy/lightheaded: no  BPH BPH status: controlled Satisfied with current treatment?: yes Medication side effects: yes Medication compliance: excellent compliance Duration: chronic Nocturia: 1/night Urinary frequency:yes Incomplete voiding: no Urgency: no Weak urinary stream: no Straining to start stream: no Dysuria: no Onset: gradual Severity: mild   Interim Problems from his last visit: yes- diagnosed with lung cancer yesterday  Functional Status Survey: Is the patient deaf or have difficulty hearing?: No Does the patient have difficulty seeing, even when  wearing glasses/contacts?: No Does the patient have difficulty concentrating, remembering, or making decisions?: No Does the patient have difficulty walking or climbing stairs?: No Does the patient have difficulty dressing or bathing?: No Does the patient have difficulty doing errands alone such as visiting a doctor's office or shopping?: No  FALL RISK:    10/02/2023    2:16 PM 03/31/2023    3:50 PM 12/30/2022    3:20 PM 09/12/2022    9:56 AM 09/11/2022   11:23 PM  Fall Risk   Falls in the past year? 0 0 0 0 0  Number falls in past yr: 0 0 0 0   Injury with Fall? 0 0 0 0   Risk for fall due to : No Fall Risks No Fall Risks No Fall Risks No Fall Risks   Follow up  Falls evaluation completed Falls evaluation completed Falls evaluation completed     Depression Screen    10/02/2023    2:18 PM 03/31/2023    3:50 PM 12/30/2022    3:20 PM 09/12/2022    9:56 AM 06/11/2022    3:25 PM  Depression screen PHQ 2/9  Decreased Interest 0 0 0 0 0  Down, Depressed, Hopeless 0 0 0 0 0  PHQ - 2 Score 0 0 0 0 0  Altered sleeping 0  0 0 1  Tired, decreased energy 0  0 0 1  Change in appetite 0  0 0 0  Feeling bad or failure about yourself  0  0 0 0  Trouble concentrating 0  0 0 0  Moving slowly or fidgety/restless 0  0 0 0  Suicidal thoughts 0  0 0 0  PHQ-9 Score 0  0 0 2  Difficult doing work/chores   Not difficult at all Not  difficult at all Not difficult at all    Advanced Directives Does patient have a HCPOA?    no If yes, name and contact information:  Does patient have a living will or MOST form?  no  Past Medical History:  Past Medical History:  Diagnosis Date   Abdominal aortic ectasia (HCC) 08/29/2022   a.) US  aorta 08/29/2022: 2.6 cm   Aortic atherosclerosis (HCC)    Bilateral inguinal hernia    BPH with elevated PSA and lower urinary tract symptoms    CAD (coronary artery disease) 11/13/2006   a.) LHC 11/13/2006: 90% mLAD, 70% D1, 60% D2, 70% pLCx, 100% pRCA, 95% RV marginal -  refer to CVTS; b.) s/p 3v CABG 11/21/2006   Cholelithiasis    Colon polyps 01/21/2009   DDD (degenerative disc disease), cervical    a.) s/p C4-C6 discectomy with interbody fusion and anterior plating 06/29/2001   DDD (degenerative disc disease), thoracic    Diastolic dysfunction    a.) TTE 12/11/2022: EF 60-65%, no RWMAs, G1DD, norm RVSF, mild MR, mild-mod TR, AoV sclerosis without stenosis.   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    S/P CABG x 3 11/21/2006   a.) LIMA-LAD, SVG-OM2, SVG-PDA   T2DM (type 2 diabetes mellitus) (HCC)    Vapes nicotine containing substance     Surgical History:  Past Surgical History:  Procedure Laterality Date   BRONCHOSCOPY, WITH BIOPSY USING ELECTROMAGNETIC NAVIGATION Left 05/26/2023   Procedure: BRONCHOSCOPY, WITH BIOPSY USING ELECTROMAGNETIC NAVIGATION;  Surgeon: Tamea Dedra CROME, MD;  Location: [facility];  Service: Pulmonary;  Laterality: Left;   CARDIAC CATHETERIZATION     CATARACT EXTRACTION W/ INTRAOCULAR LENS IMPLANT Right 03/04/2022   CERVICAL FUSION N/A 06/29/2001   COLONOSCOPY     COLONOSCOPY N/A 09/14/2020   Procedure: COLONOSCOPY;  Surgeon: Jinny Carmine, MD;  Location: [facility];  Service: Endoscopy;  Laterality: N/A;   COLONOSCOPY WITH PROPOFOL  N/A 07/24/2015   Procedure: COLONOSCOPY WITH PROPOFOL ;  Surgeon: Carmine Jinny, MD;  Location: [facility];  Service: Endoscopy;  Laterality: N/A;   CORONARY ARTERY BYPASS GRAFT   11/21/2006   Procedure: CORONARY ARTERY BYPASS GRAFT; Location: [facility]; Surgeon: Lajoyce Farr, MD   ENDOBRONCHIAL ULTRASOUND Left 09/29/2023   Procedure: ENDOBRONCHIAL ULTRASOUND (EBUS);  Surgeon: Tamea Dedra CROME, MD;  Location: [facility];  Service: Pulmonary;  Laterality: Left;   INSERTION OF MESH  01/07/2023   Procedure: INSERTION OF MESH;  Surgeon: Jordis Laneta FALCON, MD;  Location: [facility];  Service: General;;   LEFT HEART CATH AND CORONARY ANGIOGRAPHY Left 11/13/2006   Procedure: LEFT HEART CATH AND CORONARY ANGIOGRAPHY; Location: [facility]; Surgeon: Denyse Bathe, MD   POLYPECTOMY  07/24/2015   Procedure: POLYPECTOMY;  Surgeon: Carmine Jinny, MD;  Location: [facility];  Service: Endoscopy;;   VIDEO BRONCHOSCOPY WITH ENDOBRONCHIAL NAVIGATION Left 09/29/2023   Procedure: VIDEO BRONCHOSCOPY WITH ENDOBRONCHIAL NAVIGATION;  Surgeon: Tamea Dedra CROME, MD;  Location: [facility];  Service: Pulmonary;  Laterality: Left;   VIDEO BRONCHOSCOPY WITH ENDOBRONCHIAL ULTRASOUND Left 05/26/2023   Procedure: BRONCHOSCOPY, WITH EBUS;  Surgeon: Tamea Dedra CROME, MD;  Location: [facility];  Service: Pulmonary;  Laterality: Left;    Medications:  Current Outpatient Medications on File Prior to Visit  Medication Sig   amLODipine  (NORVASC ) 10 MG tablet Take 1 tablet (10 mg total) by mouth daily.   aspirin EC 81 MG tablet Take 81 mg by mouth daily. Swallow whole.   metFORMIN  (GLUCOPHAGE ) 500 MG tablet Take 2 tablets (1,000 mg total) by mouth 2 (two) times daily with a meal.   metoprolol  succinate (TOPROL -XL) 100 MG 24 hr tablet Take 1 tablet (100 mg total) by mouth daily. Take with or immediately following a meal.   Multiple Vitamins-Minerals (MULTIVITAMIN ADULT PO) Take 1 tablet by mouth daily.   omega-3 acid ethyl esters (LOVAZA ) 1 g capsule TAKE 2 CAPSULES TWICE A DAY   omeprazole  (PRILOSEC) 20 MG capsule Take 1 capsule (20 mg total) by mouth daily.   rosuvastatin  (CRESTOR ) 40 MG tablet Take 1 tablet (40 mg total) by  mouth daily.   tamsulosin  (FLOMAX ) 0.4 MG CAPS capsule Take 2 capsules (0.8 mg total) by mouth daily.   No current facility-administered medications on file prior to visit.    Allergies:  No Known Allergies  Social History:  Social History   Socioeconomic History   Marital status: Married    Spouse name: Gust,Lisa (Spouse)   Number of children: Not on file   Years of education: Not on file   Highest education level: Associate degree: occupational, technical, or vocational program  Occupational History   Not on file  Tobacco Use   Smoking status: Former    Current packs/day: 0.00    Average packs/day: 1.5 packs/day for 45.8 years (68.7 ttl pk-yrs)    Types: E-cigarettes, Cigarettes    Start date: 39    Quit date: 11/21/2021    Years since quitting: 1.8    Passive exposure: Past   Smokeless tobacco: Never   Tobacco comments:    Currently vapes.  Vaping Use   Vaping status: Every Day   Start date: 12/21/2021   Substances: Nicotine, Flavoring   Devices: mint flavor  Substance and Sexual Activity   Alcohol use: No   Drug use: No   Sexual activity: Yes  Other Topics Concern   Not on file  Social History Narrative   Not on file   Social Drivers of Health   Financial Resource Strain: Low Risk  (07/06/2023)   Overall Financial Resource Strain (CARDIA)    Difficulty of Paying Living Expenses: Not very hard  Food Insecurity: No Food Insecurity (07/06/2023)   Hunger Vital Sign    Worried About Running Out of Food in the Last Year: Never true    Ran Out of Food in the Last Year: Never true  Transportation Needs: No Transportation Needs (07/06/2023)   PRAPARE - Administrator, Civil Service (Medical): No    Lack of Transportation (Non-Medical): No  Physical Activity: Insufficiently Active (07/06/2023)   Exercise Vital Sign    Days of Exercise per Week: 3 days    Minutes of Exercise per Session: 20 min  Stress: No Stress Concern Present (07/06/2023)    [employer] of Occupational Health - Occupational Stress Questionnaire    Feeling of Stress: Not at all  Social Connections: Moderately Integrated (07/06/2023)   Social Connection and Isolation Panel  Frequency of Communication with Friends and Family: More than three times a week    Frequency of Social Gatherings with Friends and Family: Three times a week    Attends Religious Services: More than 4 times per year    Active Member of Clubs or Organizations: No    Attends Engineer, structural: Not on file    Marital Status: Married  Catering manager Violence: Not on file   Social History   Tobacco Use  Smoking Status Former   Current packs/day: 0.00   Average packs/day: 1.5 packs/day for 45.8 years (68.7 ttl pk-yrs)   Types: E-cigarettes, Cigarettes   Start date: 75   Quit date: 11/21/2021   Years since quitting: 1.8   Passive exposure: Past  Smokeless Tobacco Never  Tobacco Comments   Currently vapes.   Social History   Substance and Sexual Activity  Alcohol Use No    Family History:  Family History  Problem Relation Age of Onset   Hypertension Mother    Heart disease Mother        CAD   Heart failure Mother    Cancer Father        colon, prostate   Stroke Father    Parkinson's disease Father     Past medical history, surgical history, medications, allergies, family history and social history reviewed with patient today and changes made to appropriate areas of the chart.   Review of Systems  Constitutional:  Positive for chills. Negative for diaphoresis, fever, malaise/fatigue and weight loss.  HENT: Negative.    Eyes: Negative.   Respiratory:  Positive for cough. Negative for hemoptysis, sputum production, shortness of breath and wheezing.   Cardiovascular: Negative.   Gastrointestinal:  Positive for constipation. Negative for abdominal pain, blood in stool, diarrhea, heartburn, melena, nausea and vomiting.  Genitourinary: Negative.    Musculoskeletal: Negative.   Skin: Negative.   Neurological: Negative.   Endo/Heme/Allergies:  Negative for environmental allergies and polydipsia. Bruises/bleeds easily.  Psychiatric/Behavioral: Negative.     All other ROS negative except what is listed above and in the HPI.      Objective:    BP 104/68 (BP Location: Left Arm, Patient Position: Sitting, Cuff Size: Normal)   Pulse 63   Temp 97.6 F (36.4 C) (Oral)   Ht 6' (1.829 m)   Wt 162 lb 3.2 oz (73.6 kg)   SpO2 96%   BMI 22.00 kg/m   Wt Readings from Last 3 Encounters:  10/02/23 162 lb 3.2 oz (73.6 kg)  09/29/23 163 lb 9.3 oz (74.2 kg)  09/23/23 163 lb 9.3 oz (74.2 kg)    Physical Exam Vitals and nursing note reviewed.  Constitutional:      General: He is not in acute distress.    Appearance: Normal appearance. He is not ill-appearing, toxic-appearing or diaphoretic.  HENT:     Head: Normocephalic and atraumatic.     Right Ear: Tympanic membrane, ear canal and external ear normal. There is no impacted cerumen.     Left Ear: Tympanic membrane, ear canal and external ear normal. There is no impacted cerumen.     Nose: Nose normal. No congestion or rhinorrhea.     Mouth/Throat:     Mouth: Mucous membranes are moist.     Pharynx: Oropharynx is clear. No oropharyngeal exudate or posterior oropharyngeal erythema.  Eyes:     General: No scleral icterus.       Right eye: No discharge.  Left eye: No discharge.     Extraocular Movements: Extraocular movements intact.     Conjunctiva/sclera: Conjunctivae normal.     Pupils: Pupils are equal, round, and reactive to light.  Neck:     Vascular: No carotid bruit.  Cardiovascular:     Rate and Rhythm: Normal rate and regular rhythm.     Pulses: Normal pulses.     Heart sounds: No murmur heard.    No friction rub. No gallop.  Pulmonary:     Effort: Pulmonary effort is normal. No respiratory distress.     Breath sounds: Normal breath sounds. No stridor. No wheezing,  rhonchi or rales.  Chest:     Chest wall: No tenderness.  Abdominal:     General: Abdomen is flat. Bowel sounds are normal. There is no distension.     Palpations: Abdomen is soft. There is no mass.     Tenderness: There is no abdominal tenderness. There is no right CVA tenderness, left CVA tenderness, guarding or rebound.     Hernia: No hernia is present.  Genitourinary:    Comments: Genital exam deferred with shared decision making Musculoskeletal:        General: No swelling, tenderness, deformity or signs of injury.     Cervical back: Normal range of motion and neck supple. No rigidity. No muscular tenderness.     Right lower leg: No edema.     Left lower leg: No edema.  Lymphadenopathy:     Cervical: No cervical adenopathy.  Skin:    General: Skin is warm and dry.     Capillary Refill: Capillary refill takes less than 2 seconds.     Coloration: Skin is not jaundiced or pale.     Findings: No bruising, erythema, lesion or rash.  Neurological:     General: No focal deficit present.     Mental Status: He is alert and oriented to person, place, and time.     Cranial Nerves: No cranial nerve deficit.     Sensory: No sensory deficit.     Motor: No weakness.     Coordination: Coordination normal.     Gait: Gait normal.     Deep Tendon Reflexes: Reflexes normal.  Psychiatric:        Mood and Affect: Mood normal.        Behavior: Behavior normal.        Thought Content: Thought content normal.        Judgment: Judgment normal.        10/02/2023    2:14 PM 09/12/2022    9:50 AM  6CIT Screen  What Year? 0 points 0 points  What month? 0 points 0 points  What time? 0 points 0 points  Count back from 20 0 points 0 points  Months in reverse 0 points 0 points  Repeat phrase 0 points 0 points  Total Score 0 points 0 points   Results for orders placed or performed during the hospital encounter of 09/29/23  Surgical pathology   Collection Time: 09/29/23 12:00 AM  Result Value  Ref Range   SURGICAL PATHOLOGY      SURGICAL PATHOLOGY [facility] [address] [place], [place] [address] Telephone [phone] or [phone] Fax [phone]  REPORT OF SURGICAL PATHOLOGY   Accession #: [id number] Patient Name: LELEND, HEINECKE Visit # : [id number]  MRN: [id number] Physician: Tamea Saunas DOB/Age [birth year] (Age: 5) Gender: M Collected Date: 09/29/2023 Received Date: 09/29/2023  FINAL  DIAGNOSIS       1. Lung, biopsy, tbbx LUL :       -  INVASIVE MODERATELY DIFFERENTIATED SQUAMOUS CELL CARCINOMA.      NOTE: DR. REBBECCA HAS PEER REVIEWED THE CASE AND AGREES WITH THE      INTERPRETATION.  DR. TAMEA WAS INFORMED OF THE DIAGNOSIS ON 10/01/2023 BY EPIC      CHAT.       DATE SIGNED OUT: 10/01/2023 ELECTRONIC SIGNATURE : Legolvan Do, Mark, Pathologist, Electronic Signature  MICROSCOPIC DESCRIPTION  CASE COMMENTS STAINS USED IN DIAGNOSIS: H&E H&E    CLINICAL HISTORY  SPECIMEN(S) OBTAINED 1. Lung, biopsy, Tbbx LUL  SPECIMEN C OMMENTS: 1. R/O CA SPECIMEN CLINICAL INFORMATION: 1. 68 yo former smoker, vape use, with LUL lesions    Gross Description 1. Received in formalin are pink-red, soft tissue fragments that are submitted in toto.  Number:  multiple,   Size:  0.1-0.3 cm, 2 blocks. mb 09-29-23        Report signed out from the following location(s) [place] [address] [place], [place] [address] CLIA #: [id number]  [facility] [address] [place], [place] [address] CLIA #: [id number]   Cytology - Non PAP; LUL   Target 1   Collection Time: 09/29/23 12:00 AM  Result Value Ref Range   CYTOLOGY - NON GYN      CYTOLOGY - NON PAP [facility] [address] [place], [place] [address] Telephone [phone] or [phone] Fax [phone]  CYTOPATHOLOGY REPORT   Accession #: [id number] Patient Name: RYKKER, COVIELLO Visit # : [id number]  MRN: [id number] Physician: TAMEA Saunas DOB/Age 01-05-1956 (Age: 54) Gender: M Collected Date: 09/29/2023 Received Date: 09/29/2023  FINAL DIAGNOSIS STATEMENT Of SPECIMEN ADEQUACY:  INTERPRETATION(S):      SUSPICIOUS FOR MALIGNANCY      ATYPICAL SQUAMOUS CELLS PRESENT      DR. KASHIKAR HAS PEER REVIEWED THE CASE AND AGREES WITH THE INTERPRETATION.  DR.      TAMEA WAS INFORMED OF THE DIAGNOSIS ON 10/01/2023 BY EPIC CHAT.      DATE SIGNED OUT: 10/01/2023 ELECTRONIC SIGNATURE : Legolvan Do, Mark, Pathologist, Electronic Signature   CASE COMMENTS   CLINICAL HISTORY  SOURCE OF SPECIMEN(S) Lung, Fine Needle Aspiration  SPECIMEN COMMENTS: 1. R/O CA SPECIMEN CLINICAL INFORMATION: 1. 68 yo f ormer smoker, vape user, with LUL lesions    Gross Description Specimen: Received is/are 35cc of pink fluid in cytolyt and two slides      Prepared:      # Smears: 2      # Concentration Technique Slides (i.e. ThinPrep): 1      # Cell Block: QNS      # Diff-Quick Stain: 0      See concurrent case [id number]        Report signed out from the following location(s) [place] [address] [place], [place] [address] CLIA #: [id number]  [facility] [address] [place], [place] [address] CLIA #: [id number]   Glucose, capillary   Collection Time: 09/29/23  8:17 AM  Result Value Ref Range   Glucose-Capillary 166 (H) 70 - 99 mg/dL  Culture, Respiratory w Gram Stain   Collection Time: 09/29/23 10:55 AM   Specimen: Bronchoalveolar Lavage; Respiratory  Result Value Ref Range   Specimen Description      BRONCHIAL ALVEOLAR LAVAGE Performed at [facility], [address] [place], [place] [address]    Special Requests      NONE Performed at [facility], [address] [place], [place] [address]    Gram Stain NO WBC SEEN NO ORGANISMS SEEN     Culture      NO GROWTH 2  DAYS Performed at [facility], [address], [place], [place] [address]    Report Status 10/01/2023 FINAL   Acid Fast Smear (AFB)   Collection Time: 09/29/23 10:55 AM   Specimen: Bronchoalveolar Lavage; Respiratory  Result Value Ref Range   AFB Specimen Processing Concentration    Acid Fast Smear Negative    Source (AFB) BRONCHIAL ALVEOLAR LAVAGE   Glucose, capillary   Collection Time: 09/29/23 11:16 AM  Result Value Ref Range   Glucose-Capillary 164 (H) 70 - 99 mg/dL      Assessment & Plan:   Problem List Items Addressed This Visit       Cardiovascular and Mediastinum   Essential hypertension   Under good control on current regimen. Continue current regimen. Continue to monitor. Call with any concerns. Refills given. Labs drawn today.        Relevant Orders   Comprehensive metabolic panel with GFR   CBC with Differential/Platelet   TSH   CAD (coronary artery disease)   Will keep his BP and cholesterol and sugars under good control. Continue to monitor. Call with any concerns.       Aortic atherosclerosis (HCC)   Will keep his BP and cholesterol and sugars under good control. Continue to monitor. Call with any concerns.       Relevant Orders   Comprehensive metabolic panel with GFR   CBC with Differential/Platelet   Lipid Panel w/o Chol/HDL Ratio     Respiratory   Squamous cell carcinoma of left lung (HCC)   Newly diagnosed. Continue to follow with pulmonology. Has an appointment to see thoracic surgery. Call with any concerns. Continue to monitor.         Endocrine   Hyperlipidemia associated with type 2 diabetes mellitus (HCC)   Under good control on current regimen. Continue current regimen. Continue to monitor. Call with any concerns. Refills given. Labs drawn today.       Relevant Orders   Comprehensive metabolic panel with GFR   CBC with Differential/Platelet   Lipid Panel w/o Chol/HDL Ratio   Type 2 diabetes mellitus with other specified  complication (HCC)   Doing well with A1c of 6.5. Continue current regimen. Continue to monitor. Call with any concerns.       Relevant Orders   Comprehensive metabolic panel with GFR   CBC with Differential/Platelet   Bayer DCA Hb A1c Waived   Microalbumin, Urine Waived     Genitourinary   Benign prostatic hyperplasia with lower urinary tract symptoms   Under good control on current regimen. Continue current regimen. Continue to monitor. Call with any concerns. Refills given. Labs drawn today.       Relevant Orders   Comprehensive metabolic panel with GFR   CBC with Differential/Platelet   PSA   Other Visit Diagnoses       Encounter for annual wellness exam in Medicare patient    -  Primary   Preventative care discussed today as below.     Routine general medical examination at a health care facility       Vaccines updated. Screening labs checked today. Colonoscopy up to date. Continue diet and exercise. Call with  any concerns.     Needs flu shot       Flu shot given today.   Relevant Orders   Flu vaccine HIGH DOSE PF(Fluzone Trivalent) (Completed)        Preventative Services:  Health Risk Assessment and Personalized Prevention Plan: Done today Bone Mass Measurements: N/A CVD Screening: done today Colon Cancer Screening: up to date Depression Screening: done today Diabetes Screening: done today Glaucoma Screening: See your eye doctor Hepatitis B vaccine: done today Hepatitis C screening: up to date HIV Screening: up to date Flu Vaccine: done today Lung cancer Screening: up to date Obesity Screening: done today Pneumonia Vaccines (2): up to date STI Screening: N/A PSA screening: Done today  LABORATORY TESTING:  Health maintenance labs ordered today as discussed above.   The natural history of prostate cancer and ongoing controversy regarding screening and potential treatment outcomes of prostate cancer has been discussed with the patient. The meaning of a false  positive PSA and a false negative PSA has been discussed. He indicates understanding of the limitations of this screening test and wishes to proceed with screening PSA testing.   IMMUNIZATIONS:   - Tdap: Tetanus vaccination status reviewed: last tetanus booster within 10 years. - Influenza: Administered today - Pneumovax: Up to date - Prevnar: Up to date - Zostavax vaccine: Up to date  SCREENING: - Colonoscopy: Up to date  Discussed with patient purpose of the colonoscopy is to detect colon cancer at curable precancerous or early stages   PATIENT COUNSELING:    Sexuality: Discussed sexually transmitted diseases, partner selection, use of condoms, avoidance of unintended pregnancy  and contraceptive alternatives.   Advised to avoid cigarette smoking.  I discussed with the patient that most people either abstain from alcohol or drink within safe limits (<=14/week and <=4 drinks/occasion for males, <=7/weeks and <= 3 drinks/occasion for females) and that the risk for alcohol disorders and other health effects rises proportionally with the number of drinks per week and how often a drinker exceeds daily limits.  Discussed cessation/primary prevention of drug use and availability of treatment for abuse.   Diet: Encouraged to adjust caloric intake to maintain  or achieve ideal body weight, to reduce intake of dietary saturated fat and total fat, to limit sodium intake by avoiding high sodium foods and not adding table salt, and to maintain adequate dietary potassium and calcium  preferably from fresh fruits, vegetables, and low-fat dairy products.    stressed the importance of regular exercise  Injury prevention: Discussed safety belts, safety helmets, smoke detector, smoking near bedding or upholstery.   Dental health: Discussed importance of regular tooth brushing, flossing, and dental visits.   Follow up plan: NEXT PREVENTATIVE PHYSICAL DUE IN 1 YEAR. Return in about 3 months (around  01/01/2024).

## 2023-10-02 NOTE — Telephone Encounter (Signed)
 Per Dr. Tamea regarding above patient: Very interesting case. He had an abnormal LDCT in February/March. I did his first bronchoscopy in May after PET/CT. That was inconclusive. I followed up with a 64-month scan and it had shown growth plus growth and a second ground glass appearing nodule on the left upper lobe. I biopsied him again this Monday and he has squamous cell. However I got the positive from the smaller lesion and not from the main lesion. Really bizarre because I have shown it to two other pulmonologists and they thought it was MAC. I am referring him to [facility]. He will need to be taken out of the program.   Pt. Will be removed from the lung cancer screening program as he has lung cancer and no longer qualifies for screening per the guidelines, he will need to be cancer free x 5 years before he can return to the screening population.

## 2023-10-02 NOTE — Assessment & Plan Note (Signed)
 Under good control on current regimen. Continue current regimen. Continue to monitor. Call with any concerns. Refills given. Labs drawn today.

## 2023-10-02 NOTE — Assessment & Plan Note (Signed)
 Doing well with A1c of 6.5. Continue current regimen. Continue to monitor. Call with any concerns.

## 2023-10-02 NOTE — Patient Instructions (Signed)
 Preventative Services:  Health Risk Assessment and Personalized Prevention Plan: Done today Bone Mass Measurements: N/A CVD Screening: done today Colon Cancer Screening: up to date Depression Screening: done today Diabetes Screening: done today Glaucoma Screening: See your eye doctor Hepatitis B vaccine: done today Hepatitis C screening: up to date HIV Screening: up to date Flu Vaccine: done today Lung cancer Screening: up to date Obesity Screening: done today Pneumonia Vaccines (2): up to date STI Screening: N/A PSA screening: Done today   Mr. Maxwell Young , Thank you for taking time to come for your Medicare Wellness Visit. I appreciate your ongoing commitment to your health goals. Please review the following plan we discussed and let me know if I can assist you in the future.   These are the goals we discussed:  Goals   None     This is a list of the screening recommended for you and due dates:  Health Maintenance  Topic Date Due   Flu Shot  08/22/2023   Yearly kidney health urinalysis for diabetes  09/12/2023   Medicare Annual Wellness Visit  09/18/2023   Complete foot exam   09/12/2023   Hemoglobin A1C  01/06/2024   Eye exam for diabetics  04/22/2024   Yearly kidney function blood test for diabetes  09/25/2024   Colon Cancer Screening  09/14/2025   DTaP/Tdap/Td vaccine (3 - Td or Tdap) 11/24/2030   Pneumococcal Vaccine for age over 70  Completed   Hepatitis C Screening  Completed   Zoster (Shingles) Vaccine  Completed   HPV Vaccine  Aged Out   Meningitis B Vaccine  Aged Out   Screening for Lung Cancer  Discontinued   COVID-19 Vaccine  Discontinued

## 2023-10-03 ENCOUNTER — Ambulatory Visit: Payer: Self-pay | Admitting: Family Medicine

## 2023-10-03 LAB — CBC WITH DIFFERENTIAL/PLATELET
Basophils Absolute: 0 x10E3/uL (ref 0.0–0.2)
Basos: 0 %
EOS (ABSOLUTE): 0 x10E3/uL (ref 0.0–0.4)
Eos: 0 %
Hematocrit: 48.4 % (ref 37.5–51.0)
Hemoglobin: 16.3 g/dL (ref 13.0–17.7)
Immature Grans (Abs): 0 x10E3/uL (ref 0.0–0.1)
Immature Granulocytes: 0 %
Lymphocytes Absolute: 1.5 x10E3/uL (ref 0.7–3.1)
Lymphs: 14 %
MCH: 31.6 pg (ref 26.6–33.0)
MCHC: 33.7 g/dL (ref 31.5–35.7)
MCV: 94 fL (ref 79–97)
Monocytes Absolute: 1 x10E3/uL — ABNORMAL HIGH (ref 0.1–0.9)
Monocytes: 10 %
Neutrophils Absolute: 8.1 x10E3/uL — ABNORMAL HIGH (ref 1.4–7.0)
Neutrophils: 76 %
Platelets: 167 x10E3/uL (ref 150–450)
RBC: 5.16 x10E6/uL (ref 4.14–5.80)
RDW: 12.9 % (ref 11.6–15.4)
WBC: 10.6 x10E3/uL (ref 3.4–10.8)

## 2023-10-03 LAB — LIPID PANEL W/O CHOL/HDL RATIO
Cholesterol, Total: 91 mg/dL — ABNORMAL LOW (ref 100–199)
HDL: 38 mg/dL — ABNORMAL LOW (ref >39)
LDL Chol Calc (NIH): 28 mg/dL (ref 0–99)
Triglycerides: 149 mg/dL (ref 0–149)
VLDL Cholesterol Cal: 25 mg/dL (ref 5–40)

## 2023-10-03 LAB — COMPREHENSIVE METABOLIC PANEL WITH GFR
ALT: 27 IU/L (ref 0–44)
AST: 31 IU/L (ref 0–40)
Albumin: 4.6 g/dL (ref 3.9–4.9)
Alkaline Phosphatase: 80 IU/L (ref 44–121)
BUN/Creatinine Ratio: 17 (ref 10–24)
BUN: 16 mg/dL (ref 8–27)
Bilirubin Total: 0.6 mg/dL (ref 0.0–1.2)
CO2: 24 mmol/L (ref 20–29)
Calcium: 9.7 mg/dL (ref 8.6–10.2)
Chloride: 99 mmol/L (ref 96–106)
Creatinine, Ser: 0.96 mg/dL (ref 0.76–1.27)
Globulin, Total: 2.2 g/dL (ref 1.5–4.5)
Glucose: 173 mg/dL — ABNORMAL HIGH (ref 70–99)
Potassium: 4.5 mmol/L (ref 3.5–5.2)
Sodium: 138 mmol/L (ref 134–144)
Total Protein: 6.8 g/dL (ref 6.0–8.5)
eGFR: 87 mL/min/1.73 (ref >59)

## 2023-10-03 LAB — TSH: TSH: 1.73 u[IU]/mL (ref 0.450–4.500)

## 2023-10-03 LAB — PSA: Prostate Specific Ag, Serum: 4.1 ng/mL — ABNORMAL HIGH (ref 0.0–4.0)

## 2023-10-09 ENCOUNTER — Encounter

## 2023-10-14 ENCOUNTER — Ambulatory Visit
Attending: Thoracic Surgery (Cardiothoracic Vascular Surgery) | Admitting: Thoracic Surgery (Cardiothoracic Vascular Surgery)

## 2023-10-14 ENCOUNTER — Other Ambulatory Visit: Payer: Self-pay | Admitting: Thoracic Surgery (Cardiothoracic Vascular Surgery)

## 2023-10-14 VITALS — BP 101/62 | HR 75 | Resp 18 | Ht 72.0 in | Wt 167.0 lb

## 2023-10-14 DIAGNOSIS — R911 Solitary pulmonary nodule: Secondary | ICD-10-CM

## 2023-10-14 DIAGNOSIS — C3492 Malignant neoplasm of unspecified part of left bronchus or lung: Secondary | ICD-10-CM | POA: Diagnosis not present

## 2023-10-14 NOTE — H&P (View-Only) (Signed)
 PCP is Vicci Duwaine SQUIBB, DO Referring Provider is Tamea Dedra CROME, MD  Chief Complaint  Patient presents with   Lung Lesion    Review work up    HPI: Maxwell Young several consultation regarding squamous cell carcinoma of the left upper lobe.  Maxwell Young is a 68 year old man with a history of tobacco abuse, emphysema, CAD, status post CABG, diastolic dysfunction, aortic atherosclerosis, type 2 diabetes, hypertension, hyperlipidemia, PAD, BPH, degenerative disc disease, cholelithiasis, and nephrolithiasis.  Followed in the low-dose CT screening program.  In February he had a low-dose CT which showed a new 12.8 mm nodule in the anterior left upper lobe.  PET/CT showed the nodule was hypermetabolic with an SUV of 4.9.  There was some adjacent activity with SUV of 7.2 but no discrete nodule in that area.  He underwent robotic bronchoscopy in May.  Inflammatory cells were noted but no malignancy.  A follow-up CT was done in August.  It showed progression of the anterior nodule to 18 mm.  There was a more subsolid appearing nodule in the other area of activity on the previous PET.  He underwent a repeat robotic bronchoscopy.  Biopsies of the primary nodule were again nondiagnostic but biopsies of the more subsolid lesion showed squamous cell carcinoma.  He smoked about a pack of cigarettes daily for 45 years before quitting in 2023.  He has continued to use a vape device.  Denies any shortness of breath, cough, or wheezing.  Does not require inhalers.  No change in appetite but has lost a little weight which she attributes to diabetes.  Had coronary bypass grafting at [facility] in 2008.  Followed by Dr. Mady.  No chest pain, pressure, tightness.  Zubrod Score: At the time of surgery this patient's most appropriate activity status/level should be described as: [x]     0    Normal activity, no symptoms []     1    Restricted in physical strenuous activity but ambulatory, able to do out light work []      2    Ambulatory and capable of self care, unable to do work activities, up and about >50 % of waking hours                              []     3    Only limited self care, in bed greater than 50% of waking hours []     4    Completely disabled, no self care, confined to bed or chair []     5    Moribund  Past Medical History:  Diagnosis Date   Abdominal aortic ectasia 08/29/2022   a.) US  aorta 08/29/2022: 2.6 cm   Aortic atherosclerosis    Bilateral inguinal hernia    BPH with elevated PSA and lower urinary tract symptoms    CAD (coronary artery disease) 11/13/2006   a.) LHC 11/13/2006: 90% mLAD, 70% D1, 60% D2, 70% pLCx, 100% pRCA, 95% RV marginal - refer to CVTS; b.) s/p 3v CABG 11/21/2006   Cholelithiasis    Colon polyps 01/21/2009   DDD (degenerative disc disease), cervical    a.) s/p C4-C6 discectomy with interbody fusion and anterior plating 06/29/2001   DDD (degenerative disc disease), thoracic    Diastolic dysfunction    a.) TTE 12/11/2022: EF 60-65%, no RWMAs, G1DD, norm RVSF, mild MR, mild-mod TR, AoV sclerosis without stenosis.   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    S/P CABG x 3 11/21/2006   a.) LIMA-LAD, SVG-OM2, SVG-PDA   T2DM (type 2 diabetes mellitus) (HCC)    Vapes nicotine containing substance     Past Surgical History:  Procedure Laterality Date   BRONCHOSCOPY, WITH BIOPSY USING ELECTROMAGNETIC NAVIGATION Left 05/26/2023   Procedure: BRONCHOSCOPY, WITH BIOPSY USING  ELECTROMAGNETIC NAVIGATION;  Surgeon: Tamea Dedra CROME, MD;  Location: [facility] OR[facility];  Service: Pulmonary;  Laterality: Left;   CARDIAC CATHETERIZATION     CATARACT EXTRACTION W/ INTRAOCULAR LENS IMPLANT Right 03/04/2022   CERVICAL FUSION N/A 06/29/2001   COLONOSCOPY     COLONOSCOPY N/A 09/14/2020   Procedure: COLONOSCOPY;  Surgeon: Jinny Carmine, MD;  Location: [facility];  Service: Endoscopy;  Laterality: N/A;   COLONOSCOPY WITH PROPOFOL  N/A 07/24/2015   Procedure: COLONOSCOPY WITH PROPOFOL ;  Surgeon: Carmine Jinny, MD;  Location: [facility];  Service: Endoscopy;  Laterality: N/A;   CORONARY ARTERY BYPASS GRAFT  11/21/2006   Procedure: CORONARY ARTERY BYPASS GRAFT; Location: [facility]; Surgeon: Lajoyce Farr, MD   ENDOBRONCHIAL ULTRASOUND Left 09/29/2023   Procedure: ENDOBRONCHIAL ULTRASOUND (EBUS);  Surgeon: Tamea Dedra CROME, MD;  Location: [facility];  Service: Pulmonary;  Laterality: Left;   INSERTION OF MESH  01/07/2023   Procedure: INSERTION OF MESH;  Surgeon: Jordis Laneta FALCON, MD;  Location: [facility];  Service: General;;   LEFT HEART CATH AND CORONARY ANGIOGRAPHY Left 11/13/2006   Procedure: LEFT HEART CATH AND CORONARY ANGIOGRAPHY; Location: [facility]; Surgeon: Denyse Bathe, MD   POLYPECTOMY  07/24/2015   Procedure: POLYPECTOMY;  Surgeon: Carmine Jinny, MD;  Location: [facility];  Service: Endoscopy;;   VIDEO BRONCHOSCOPY WITH ENDOBRONCHIAL NAVIGATION Left 09/29/2023   Procedure: VIDEO BRONCHOSCOPY WITH ENDOBRONCHIAL NAVIGATION;  Surgeon: Tamea Dedra CROME, MD;  Location: [facility];  Service: Pulmonary;  Laterality: Left;   VIDEO BRONCHOSCOPY WITH ENDOBRONCHIAL ULTRASOUND Left 05/26/2023   Procedure: BRONCHOSCOPY, WITH EBUS;  Surgeon: Tamea Dedra CROME, MD;  Location: [facility];  Service: Pulmonary;  Laterality: Left;    Family History  Problem Relation Age of Onset   Hypertension Mother    Heart disease Mother        CAD   Heart failure Mother    Cancer Father        colon, prostate    Stroke Father    Parkinson's disease Father     Social History Social History   Tobacco Use   Smoking status: Former    Current packs/day: 0.00    Average packs/day: 1.5 packs/day for 45.8 years (68.7 ttl pk-yrs)    Types: E-cigarettes, Cigarettes    Start date: 52    Quit date: 11/21/2021    Years since quitting: 1.8    Passive exposure: Past   Smokeless tobacco: Never   Tobacco comments:    Currently vapes.  Vaping Use   Vaping status: Every Day   Start date: 12/21/2021   Substances: Nicotine, Flavoring  Devices: mint flavor  Substance Use Topics   Alcohol use: No   Drug use: No    Current Outpatient Medications  Medication Sig Dispense Refill   amLODipine  (NORVASC ) 10 MG tablet Take 1 tablet (10 mg total) by mouth daily. 90 tablet 1   aspirin EC 81 MG tablet Take 81 mg by mouth daily. Swallow whole.     metFORMIN  (GLUCOPHAGE ) 500 MG tablet Take 2 tablets (1,000 mg total) by mouth 2 (two) times daily with a meal. 360 tablet 1   metoprolol  succinate (TOPROL -XL) 100 MG 24 hr tablet Take 1 tablet (100 mg total) by mouth daily. Take with or immediately following a meal. 90 tablet 1   Multiple Vitamins-Minerals (MULTIVITAMIN ADULT PO) Take 1 tablet by mouth daily.     omega-3 acid ethyl esters (LOVAZA ) 1 g capsule TAKE 2 CAPSULES TWICE A DAY 360 capsule 1   omeprazole  (PRILOSEC) 20 MG capsule Take 1 capsule (20 mg total) by mouth daily. 90 capsule 1   rosuvastatin  (CRESTOR ) 40 MG tablet Take 1 tablet (40 mg total) by mouth daily. 90 tablet 1   tamsulosin  (FLOMAX ) 0.4 MG CAPS capsule Take 2 capsules (0.8 mg total) by mouth daily. 180 capsule 1   No current facility-administered medications for this visit.    No Known Allergies  Review of Systems  Constitutional:  Negative for activity change, fatigue and unexpected weight change.  HENT:  Positive for dental problem (Dentures). Negative for trouble swallowing.   Eyes:  Negative for visual disturbance.  Respiratory:   Negative for cough, shortness of breath and wheezing.   Cardiovascular:  Negative for chest pain and leg swelling.  Gastrointestinal:  Negative for abdominal pain.  Genitourinary:  Negative for difficulty urinating and dysuria.  Musculoskeletal:  Negative for arthralgias and gait problem.  Neurological:  Negative for seizures and weakness.  Hematological:  Negative for adenopathy. Does not bruise/bleed easily.  All other systems reviewed and are negative.   BP 101/62   Pulse 75   Resp 18   Ht 6' (1.829 m)   Wt 167 lb (75.8 kg)   SpO2 95%   BMI 22.65 kg/m  Physical Exam Vitals reviewed.  Constitutional:      General: He is not in acute distress.    Appearance: Normal appearance.  HENT:     Head: Normocephalic and atraumatic.  Eyes:     General: No scleral icterus.    Extraocular Movements: Extraocular movements intact.  Neck:     Vascular: No carotid bruit.  Cardiovascular:     Rate and Rhythm: Normal rate and regular rhythm.     Heart sounds: Normal heart sounds. No murmur heard.    No friction rub. No gallop.  Pulmonary:     Effort: Pulmonary effort is normal. No respiratory distress.     Breath sounds: Normal breath sounds. No wheezing or rales.  Abdominal:     Palpations: Abdomen is soft.  Lymphadenopathy:     Cervical: No cervical adenopathy.  Skin:    General: Skin is warm and dry.  Neurological:     General: No focal deficit present.     Mental Status: He is alert and oriented to person, place, and time.     Cranial Nerves: No cranial nerve deficit.    Diagnostic Tests: CT CHEST WITHOUT CONTRAST   TECHNIQUE: Multidetector CT imaging of the chest was performed following the standard protocol without IV contrast.   RADIATION DOSE REDUCTION: This exam was performed according to the  departmental dose-optimization program which includes automated exposure control, adjustment of the mA and/or kV according to patient size and/or use of iterative  reconstruction technique.   COMPARISON:  05/16/2023   FINDINGS: Cardiovascular: The heart size is normal. No substantial pericardial effusion. Coronary artery calcification is evident. Moderate atherosclerotic calcification is noted in the wall of the thoracic aorta. Status post CABG.   Mediastinum/Nodes: No mediastinal lymphadenopathy. No evidence for gross hilar lymphadenopathy although assessment is limited by the lack of intravenous contrast on the current study. The esophagus has normal imaging features. There is no axillary lymphadenopathy.   Lungs/Pleura: Advanced changes of emphysema noted in the upper lungs bilaterally. Irregular anterior left upper lobe pulmonary nodule measured previously at 13 x 10 mm is progressive measuring 18 x 18 mm today on image 70/3. Just lateral to this lesion there is subpleural interstitial thickening with subtle airspace consolidative disease (76/3) mildly progressive since prior.   No new suspicious pulmonary nodule or mass. No pulmonary edema or pleural effusion.   Upper Abdomen: Calcified gallstones again noted.   Musculoskeletal: No worrisome lytic or sclerotic osseous abnormality.   IMPRESSION: 1. Interval progression of the irregular anterior left upper lobe pulmonary nodule measuring 18 x 18 mm today compared to 13 x 10 mm previously. Imaging features remain concerning for primary bronchogenic neoplasm. 2. Just lateral to this lesion there is subpleural interstitial thickening with subtle airspace consolidative disease, mildly progressive since prior. 3. Cholelithiasis. 4. Aortic Atherosclerosis (ICD10-I70.0) and Emphysema (ICD10-J43.9).     Electronically Signed   By: Camellia Candle M.D.   On: 09/09/2023 05:42 I personally reviewed the CT images.  Also reviewed his PET images from March 2025.  Severe emphysema involving both upper lobes.  18 mm nodule that was hypermetabolic on previous PET.  Subsolid nodule in area of  hypermetabolism previously.  Previous sternotomy.  Coronary and aortic atherosclerosis.  Pulmonary function testing 05/14/2023 FVC 4.41 (93%) FEV1 3.06 (86%) FEV1 3.27 (92%) postbronchodilator RV 2.50 (103%) DLCO 19.36 (71%)  Impression: Maxwell Young is a 68 year old man with a history of tobacco abuse, emphysema, CAD, status post CABG, diastolic dysfunction, aortic atherosclerosis, type 2 diabetes, hypertension, hyperlipidemia, PAD, BPH, degenerative disc disease, cholelithiasis, nephrolithiasis, and newly diagnosed squamous cell carcinoma of the lung.  Squamous cell carcinoma left upper lobe-interesting in that it was not the primary nodule suspicion was positive but a second nodule that really developed an area of previous hypermetabolism over the course of about 6 months.  Because of that I think he needs another PET is that 1 is 25 months old at this point.  Would like to do that before surgery as it could change our decision making.  Clinical stage is 1A (T1, N0) versus 2B (T3, N0) if the larger nodule is also squamous cell carcinoma.  If it turned out to be an adeno then it would be synchronous 1A.  We discussed the treatment options of surgical resection versus stereotactic radiation.  In his case the surgery would be a left upper lobectomy.  He has adequate pulmonary function to tolerate a left upper lobectomy, and in fact, I think it will have minimal effect on his respiratory capacity because of the extensive emphysema in the upper lobe.  I described the proposed operative procedure to him.  The plan would be to do a robotic assisted left upper lobectomy.  I informed Mr. and Mrs. Craton of the general nature of the procedure including the need for general anesthesia, the incisions to be  used, the use of the surgical robot, the possible need for conversion to open, use of a drainage tube postoperatively, the expected hospital stay, and the overall recovery.  I informed him of the  indications, risks, benefits, and alternatives.  He understands the risks include, but not limited to death, MI, DVT, PE, bleeding, possible need for transfusion, infection, prolonged air leak, cardiac arrhythmias, as well as the possibility of other unforeseeable complications.  He understands recovery is largely dependent on pain which is unpredictable.  He had coronary bypass grafting in [facility] about 17 years ago.  He is not having any anginal symptoms.  Followed by Dr. Mady.  Will check with him whether any additional cardiac testing is necessary.  I would not think so given his lack of symptoms.  Plan: Repeat PET/CT-rapidly developing squamous cell carcinoma.  Over 6 months since prior PET. Will check with Dr. Mady regarding need for any additional cardiac testing Will schedule for robotic assisted left upper lobectomy once PET result available.  Elspeth JAYSON Millers, MD [facility] [phone]

## 2023-10-14 NOTE — Progress Notes (Signed)
 PCP is Vicci Duwaine SQUIBB, DO Referring Provider is Tamea Dedra CROME, MD  Chief Complaint  Patient presents with   Lung Lesion    Review work up    HPI: Maxwell Young several consultation regarding squamous cell carcinoma of the left upper lobe.  Maxwell Young is a 68 year old man with a history of tobacco abuse, emphysema, CAD, status post CABG, diastolic dysfunction, aortic atherosclerosis, type 2 diabetes, hypertension, hyperlipidemia, PAD, BPH, degenerative disc disease, cholelithiasis, and nephrolithiasis.  Followed in the low-dose CT screening program.  In February he had a low-dose CT which showed a new 12.8 mm nodule in the anterior left upper lobe.  PET/CT showed the nodule was hypermetabolic with an SUV of 4.9.  There was some adjacent activity with SUV of 7.2 but no discrete nodule in that area.  He underwent robotic bronchoscopy in May.  Inflammatory cells were noted but no malignancy.  A follow-up CT was done in August.  It showed progression of the anterior nodule to 18 mm.  There was a more subsolid appearing nodule in the other area of activity on the previous PET.  He underwent a repeat robotic bronchoscopy.  Biopsies of the primary nodule were again nondiagnostic but biopsies of the more subsolid lesion showed squamous cell carcinoma.  He smoked about a pack of cigarettes daily for 45 years before quitting in 2023.  He has continued to use a vape device.  Denies any shortness of breath, cough, or wheezing.  Does not require inhalers.  No change in appetite but has lost a little weight which she attributes to diabetes.  Had coronary bypass grafting at [facility] in 2008.  Followed by Dr. Mady.  No chest pain, pressure, tightness.  Zubrod Score: At the time of surgery this patient's most appropriate activity status/level should be described as: [x]     0    Normal activity, no symptoms []     1    Restricted in physical strenuous activity but ambulatory, able to do out light work []      2    Ambulatory and capable of self care, unable to do work activities, up and about >50 % of waking hours                              []     3    Only limited self care, in bed greater than 50% of waking hours []     4    Completely disabled, no self care, confined to bed or chair []     5    Moribund  Past Medical History:  Diagnosis Date   Abdominal aortic ectasia 08/29/2022   a.) US  aorta 08/29/2022: 2.6 cm   Aortic atherosclerosis    Bilateral inguinal hernia    BPH with elevated PSA and lower urinary tract symptoms    CAD (coronary artery disease) 11/13/2006   a.) LHC 11/13/2006: 90% mLAD, 70% D1, 60% D2, 70% pLCx, 100% pRCA, 95% RV marginal - refer to CVTS; b.) s/p 3v CABG 11/21/2006   Cholelithiasis    Colon polyps 01/21/2009   DDD (degenerative disc disease), cervical    a.) s/p C4-C6 discectomy with interbody fusion and anterior plating 06/29/2001   DDD (degenerative disc disease), thoracic    Diastolic dysfunction    a.) TTE 12/11/2022: EF 60-65%, no RWMAs, G1DD, norm RVSF, mild MR, mild-mod TR, AoV sclerosis without stenosis.   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    S/P CABG x 3 11/21/2006   a.) LIMA-LAD, SVG-OM2, SVG-PDA   T2DM (type 2 diabetes mellitus) (HCC)    Vapes nicotine containing substance     Past Surgical History:  Procedure Laterality Date   BRONCHOSCOPY, WITH BIOPSY USING ELECTROMAGNETIC NAVIGATION Left 05/26/2023   Procedure: BRONCHOSCOPY, WITH BIOPSY USING  ELECTROMAGNETIC NAVIGATION;  Surgeon: Tamea Dedra CROME, MD;  Location: [facility] ORS;  Service: Pulmonary;  Laterality: Left;   CARDIAC CATHETERIZATION     CATARACT EXTRACTION W/ INTRAOCULAR LENS IMPLANT Right 03/04/2022   CERVICAL FUSION N/A 06/29/2001   COLONOSCOPY     COLONOSCOPY N/A 09/14/2020   Procedure: COLONOSCOPY;  Surgeon: Jinny Carmine, MD;  Location: [facility] END[facility];  Service: Endoscopy;  Laterality: N/A;   COLONOSCOPY WITH PROPOFOL  N/A 07/24/2015   Procedure: COLONOSCOPY WITH PROPOFOL ;  Surgeon: Carmine Jinny, MD;  Location: [facility];  Service: Endoscopy;  Laterality: N/A;   CORONARY ARTERY BYPASS GRAFT  11/21/2006   Procedure: CORONARY ARTERY BYPASS GRAFT; Location: [facility]; Surgeon: Lajoyce Farr, MD   ENDOBRONCHIAL ULTRASOUND Left 09/29/2023   Procedure: ENDOBRONCHIAL ULTRASOUND (EBUS);  Surgeon: Tamea Dedra CROME, MD;  Location: [facility];  Service: Pulmonary;  Laterality: Left;   INSERTION OF MESH  01/07/2023   Procedure: INSERTION OF MESH;  Surgeon: Jordis Laneta FALCON, MD;  Location: [facility];  Service: General;;   LEFT HEART CATH AND CORONARY ANGIOGRAPHY Left 11/13/2006   Procedure: LEFT HEART CATH AND CORONARY ANGIOGRAPHY; Location: [facility]; Surgeon: Denyse Bathe, MD   POLYPECTOMY  07/24/2015   Procedure: POLYPECTOMY;  Surgeon: Carmine Jinny, MD;  Location: [facility];  Service: Endoscopy;;   VIDEO BRONCHOSCOPY WITH ENDOBRONCHIAL NAVIGATION Left 09/29/2023   Procedure: VIDEO BRONCHOSCOPY WITH ENDOBRONCHIAL NAVIGATION;  Surgeon: Tamea Dedra CROME, MD;  Location: [facility];  Service: Pulmonary;  Laterality: Left;   VIDEO BRONCHOSCOPY WITH ENDOBRONCHIAL ULTRASOUND Left 05/26/2023   Procedure: BRONCHOSCOPY, WITH EBUS;  Surgeon: Tamea Dedra CROME, MD;  Location: [facility];  Service: Pulmonary;  Laterality: Left;    Family History  Problem Relation Age of Onset   Hypertension Mother    Heart disease Mother        CAD   Heart failure Mother    Cancer Father        colon, prostate    Stroke Father    Parkinson's disease Father     Social History Social History   Tobacco Use   Smoking status: Former    Current packs/day: 0.00    Average packs/day: 1.5 packs/day for 45.8 years (68.7 ttl pk-yrs)    Types: E-cigarettes, Cigarettes    Start date: 52    Quit date: 11/21/2021    Years since quitting: 1.8    Passive exposure: Past   Smokeless tobacco: Never   Tobacco comments:    Currently vapes.  Vaping Use   Vaping status: Every Day   Start date: 12/21/2021   Substances: Nicotine, Flavoring  Devices: mint flavor  Substance Use Topics   Alcohol use: No   Drug use: No    Current Outpatient Medications  Medication Sig Dispense Refill   amLODipine  (NORVASC ) 10 MG tablet Take 1 tablet (10 mg total) by mouth daily. 90 tablet 1   aspirin EC 81 MG tablet Take 81 mg by mouth daily. Swallow whole.     metFORMIN  (GLUCOPHAGE ) 500 MG tablet Take 2 tablets (1,000 mg total) by mouth 2 (two) times daily with a meal. 360 tablet 1   metoprolol  succinate (TOPROL -XL) 100 MG 24 hr tablet Take 1 tablet (100 mg total) by mouth daily. Take with or immediately following a meal. 90 tablet 1   Multiple Vitamins-Minerals (MULTIVITAMIN ADULT PO) Take 1 tablet by mouth daily.     omega-3 acid ethyl esters (LOVAZA ) 1 g capsule TAKE 2 CAPSULES TWICE A DAY 360 capsule 1   omeprazole  (PRILOSEC) 20 MG capsule Take 1 capsule (20 mg total) by mouth daily. 90 capsule 1   rosuvastatin  (CRESTOR ) 40 MG tablet Take 1 tablet (40 mg total) by mouth daily. 90 tablet 1   tamsulosin  (FLOMAX ) 0.4 MG CAPS capsule Take 2 capsules (0.8 mg total) by mouth daily. 180 capsule 1   No current facility-administered medications for this visit.    No Known Allergies  Review of Systems  Constitutional:  Negative for activity change, fatigue and unexpected weight change.  HENT:  Positive for dental problem (Dentures). Negative for trouble swallowing.   Eyes:  Negative for visual disturbance.  Respiratory:   Negative for cough, shortness of breath and wheezing.   Cardiovascular:  Negative for chest pain and leg swelling.  Gastrointestinal:  Negative for abdominal pain.  Genitourinary:  Negative for difficulty urinating and dysuria.  Musculoskeletal:  Negative for arthralgias and gait problem.  Neurological:  Negative for seizures and weakness.  Hematological:  Negative for adenopathy. Does not bruise/bleed easily.  All other systems reviewed and are negative.   BP 101/62   Pulse 75   Resp 18   Ht 6' (1.829 m)   Wt 167 lb (75.8 kg)   SpO2 95%   BMI 22.65 kg/m  Physical Exam Vitals reviewed.  Constitutional:      General: He is not in acute distress.    Appearance: Normal appearance.  HENT:     Head: Normocephalic and atraumatic.  Eyes:     General: No scleral icterus.    Extraocular Movements: Extraocular movements intact.  Neck:     Vascular: No carotid bruit.  Cardiovascular:     Rate and Rhythm: Normal rate and regular rhythm.     Heart sounds: Normal heart sounds. No murmur heard.    No friction rub. No gallop.  Pulmonary:     Effort: Pulmonary effort is normal. No respiratory distress.     Breath sounds: Normal breath sounds. No wheezing or rales.  Abdominal:     Palpations: Abdomen is soft.  Lymphadenopathy:     Cervical: No cervical adenopathy.  Skin:    General: Skin is warm and dry.  Neurological:     General: No focal deficit present.     Mental Status: He is alert and oriented to person, place, and time.     Cranial Nerves: No cranial nerve deficit.    Diagnostic Tests: CT CHEST WITHOUT CONTRAST   TECHNIQUE: Multidetector CT imaging of the chest was performed following the standard protocol without IV contrast.   RADIATION DOSE REDUCTION: This exam was performed according to the  departmental dose-optimization program which includes automated exposure control, adjustment of the mA and/or kV according to patient size and/or use of iterative  reconstruction technique.   COMPARISON:  05/16/2023   FINDINGS: Cardiovascular: The heart size is normal. No substantial pericardial effusion. Coronary artery calcification is evident. Moderate atherosclerotic calcification is noted in the wall of the thoracic aorta. Status post CABG.   Mediastinum/Nodes: No mediastinal lymphadenopathy. No evidence for gross hilar lymphadenopathy although assessment is limited by the lack of intravenous contrast on the current study. The esophagus has normal imaging features. There is no axillary lymphadenopathy.   Lungs/Pleura: Advanced changes of emphysema noted in the upper lungs bilaterally. Irregular anterior left upper lobe pulmonary nodule measured previously at 13 x 10 mm is progressive measuring 18 x 18 mm today on image 70/3. Just lateral to this lesion there is subpleural interstitial thickening with subtle airspace consolidative disease (76/3) mildly progressive since prior.   No new suspicious pulmonary nodule or mass. No pulmonary edema or pleural effusion.   Upper Abdomen: Calcified gallstones again noted.   Musculoskeletal: No worrisome lytic or sclerotic osseous abnormality.   IMPRESSION: 1. Interval progression of the irregular anterior left upper lobe pulmonary nodule measuring 18 x 18 mm today compared to 13 x 10 mm previously. Imaging features remain concerning for primary bronchogenic neoplasm. 2. Just lateral to this lesion there is subpleural interstitial thickening with subtle airspace consolidative disease, mildly progressive since prior. 3. Cholelithiasis. 4. Aortic Atherosclerosis (ICD10-I70.0) and Emphysema (ICD10-J43.9).     Electronically Signed   By: Camellia Candle M.D.   On: 09/09/2023 05:42 I personally reviewed the CT images.  Also reviewed his PET images from March 2025.  Severe emphysema involving both upper lobes.  18 mm nodule that was hypermetabolic on previous PET.  Subsolid nodule in area of  hypermetabolism previously.  Previous sternotomy.  Coronary and aortic atherosclerosis.  Pulmonary function testing 05/14/2023 FVC 4.41 (93%) FEV1 3.06 (86%) FEV1 3.27 (92%) postbronchodilator RV 2.50 (103%) DLCO 19.36 (71%)  Impression: Maxwell Young is a 68 year old man with a history of tobacco abuse, emphysema, CAD, status post CABG, diastolic dysfunction, aortic atherosclerosis, type 2 diabetes, hypertension, hyperlipidemia, PAD, BPH, degenerative disc disease, cholelithiasis, nephrolithiasis, and newly diagnosed squamous cell carcinoma of the lung.  Squamous cell carcinoma left upper lobe-interesting in that it was not the primary nodule suspicion was positive but a second nodule that really developed an area of previous hypermetabolism over the course of about 6 months.  Because of that I think he needs another PET is that 1 is 25 months old at this point.  Would like to do that before surgery as it could change our decision making.  Clinical stage is 1A (T1, N0) versus 2B (T3, N0) if the larger nodule is also squamous cell carcinoma.  If it turned out to be an adeno then it would be synchronous 1A.  We discussed the treatment options of surgical resection versus stereotactic radiation.  In his case the surgery would be a left upper lobectomy.  He has adequate pulmonary function to tolerate a left upper lobectomy, and in fact, I think it will have minimal effect on his respiratory capacity because of the extensive emphysema in the upper lobe.  I described the proposed operative procedure to him.  The plan would be to do a robotic assisted left upper lobectomy.  I informed Mr. and Mrs. Craton of the general nature of the procedure including the need for general anesthesia, the incisions to be  used, the use of the surgical robot, the possible need for conversion to open, use of a drainage tube postoperatively, the expected hospital stay, and the overall recovery.  I informed him of the  indications, risks, benefits, and alternatives.  He understands the risks include, but not limited to death, MI, DVT, PE, bleeding, possible need for transfusion, infection, prolonged air leak, cardiac arrhythmias, as well as the possibility of other unforeseeable complications.  He understands recovery is largely dependent on pain which is unpredictable.  He had coronary bypass grafting in [facility] about 17 years ago.  He is not having any anginal symptoms.  Followed by Dr. Mady.  Will check with him whether any additional cardiac testing is necessary.  I would not think so given his lack of symptoms.  Plan: Repeat PET/CT-rapidly developing squamous cell carcinoma.  Over 6 months since prior PET. Will check with Dr. Mady regarding need for any additional cardiac testing Will schedule for robotic assisted left upper lobectomy once PET result available.  Maxwell JAYSON Millers, MD [facility] [phone]

## 2023-10-15 ENCOUNTER — Encounter: Payer: Self-pay | Admitting: *Deleted

## 2023-10-15 ENCOUNTER — Other Ambulatory Visit: Payer: Self-pay | Admitting: *Deleted

## 2023-10-15 DIAGNOSIS — C3492 Malignant neoplasm of unspecified part of left bronchus or lung: Secondary | ICD-10-CM

## 2023-10-22 ENCOUNTER — Ambulatory Visit
Admission: RE | Admit: 2023-10-22 | Discharge: 2023-10-22 | Disposition: A | Discharge status: Home / Self Care | Source: Ambulatory Visit | Attending: Thoracic Surgery (Cardiothoracic Vascular Surgery) | Admitting: Thoracic Surgery (Cardiothoracic Vascular Surgery)

## 2023-10-22 DIAGNOSIS — N400 Enlarged prostate without lower urinary tract symptoms: Secondary | ICD-10-CM | POA: Insufficient documentation

## 2023-10-22 DIAGNOSIS — J439 Emphysema, unspecified: Secondary | ICD-10-CM | POA: Insufficient documentation

## 2023-10-22 DIAGNOSIS — R911 Solitary pulmonary nodule: Secondary | ICD-10-CM | POA: Diagnosis not present

## 2023-10-22 DIAGNOSIS — I2510 Atherosclerotic heart disease of native coronary artery without angina pectoris: Secondary | ICD-10-CM | POA: Insufficient documentation

## 2023-10-22 DIAGNOSIS — C3492 Malignant neoplasm of unspecified part of left bronchus or lung: Secondary | ICD-10-CM | POA: Diagnosis not present

## 2023-10-22 DIAGNOSIS — I714 Abdominal aortic aneurysm, without rupture, unspecified: Secondary | ICD-10-CM | POA: Diagnosis not present

## 2023-10-22 DIAGNOSIS — K8020 Calculus of gallbladder without cholecystitis without obstruction: Secondary | ICD-10-CM | POA: Insufficient documentation

## 2023-10-22 LAB — GLUCOSE, CAPILLARY: Glucose-Capillary: 164 mg/dL — ABNORMAL HIGH (ref 70–99)

## 2023-10-22 MED ORDER — FLUDEOXYGLUCOSE F - 18 (FDG) INJECTION
8.7000 | Freq: Once | INTRAVENOUS | Status: AC | PRN
  Administered 2023-10-22: 9.4 via INTRAVENOUS

## 2023-10-22 NOTE — Progress Notes (Signed)
 Surgical Instructions   Your procedure is scheduled on Monday October 27, 2023. Report to [facility] Main Entrance A at 10:15 A.M., then check in with the Admitting office. Any questions or running late day of surgery: call [phone]  Questions prior to your surgery date: call [phone], Monday-Friday, 8am-4pm. If you experience any cold or flu symptoms such as cough, fever, chills, shortness of breath, etc. between now and your scheduled surgery, please notify us  at the above number.     Remember:  Do not eat or drink after midnight the night before your surgery  Take these medicines the morning of surgery with A SIP OF WATER   amLODipine  (NORVASC )  metoprolol  succinate (TOPROL -XL)  omeprazole  (PRILOSEC) rosuvastatin  (CRESTOR )  tamsulosin  (FLOMAX )    May take these medicines IF NEEDED:  DO NOT TAKE YOUR ASPIRIN THE MORNING OF SURGERY.   DO NOT TAKE YOUR omega-3 acid ethyl esters (LOVAZA ) 5 DAYS PRIOR TO SURGERY WITH THE LAST DOSE BEING 10/21/2023   One week prior to surgery, STOP taking any Aspirin (unless otherwise instructed by your surgeon) Aleve, Naproxen, Ibuprofen, Motrin, Advil, Goody's, BC's, all herbal medications, fish oil, and non-prescription vitamins.   WHAT DO I DO ABOUT MY DIABETES MEDICATION?   Do not take oral diabetes medicines (pills) the morning of surgery.        DO NOT TAKE YOUR metFORMIN  (GLUCOPHAGE ) 2 DAYS PRIOR TO SURGERY, WITH THE LAST DOSE BEING 10/24/2023     The day of surgery, do not take other diabetes injectables, including Byetta (exenatide), Bydureon (exenatide ER), Victoza (liraglutide), or Trulicity (dulaglutide).  If your CBG is greater than 220 mg/dL, you may take  of your sliding scale (correction) dose of insulin .   HOW TO MANAGE YOUR DIABETES BEFORE AND AFTER SURGERY  Why is it important to control my blood sugar before and after surgery? Improving blood sugar levels before and after surgery helps healing and can limit  problems. A way of improving blood sugar control is eating a healthy diet by:  Eating less sugar and carbohydrates  Increasing activity/exercise  Talking with your doctor about reaching your blood sugar goals High blood sugars (greater than 180 mg/dL) can raise your risk of infections and slow your recovery, so you will need to focus on controlling your diabetes during the weeks before surgery. Make sure that the doctor who takes care of your diabetes knows about your planned surgery including the date and location.  How do I manage my blood sugar before surgery? Check your blood sugar at least 4 times a day, starting 2 days before surgery, to make sure that the level is not too high or low.  Check your blood sugar the morning of your surgery when you wake up and every 2 hours until you get to the Short Stay unit.  If your blood sugar is less than 70 mg/dL, you will need to treat for low blood sugar: Do not take insulin . Treat a low blood sugar (less than 70 mg/dL) with  cup of clear juice (cranberry or apple), 4 glucose tablets, OR glucose gel. Recheck blood sugar in 15 minutes after treatment (to make sure it is greater than 70 mg/dL). If your blood sugar is not greater than 70 mg/dL on recheck, call [phone] for further instructions. Report your blood sugar to the short stay nurse when you get to Short Stay.  If you are admitted to the hospital after surgery: Your blood sugar will be checked by the staff and you will  probably be given insulin  after surgery (instead of oral diabetes medicines) to make sure you have good blood sugar levels. The goal for blood sugar control after surgery is 80-180 mg/dL.                      Do NOT Smoke (Tobacco/Vaping) for 24 hours prior to your procedure.  If you use a CPAP at night, you may bring your mask/headgear for your overnight stay.   You will be asked to remove any contacts, glasses, piercing's, hearing aid's, dentures/partials prior to  surgery. Please bring cases for these items if needed.    Patients discharged the day of surgery will not be allowed to drive home, and someone needs to stay with them for 24 hours.  SURGICAL WAITING ROOM VISITATION Patients may have no more than 2 support people in the waiting area - these visitors may rotate.   Pre-op nurse will coordinate an appropriate time for 1 ADULT support person, who may not rotate, to accompany patient in pre-op.  Children under the age of 32 must have an adult with them who is not the patient and must remain in the main waiting area with an adult.  If the patient needs to stay at the hospital during part of their recovery, the visitor guidelines for inpatient rooms apply.  Please refer to the [facility] website for the visitor guidelines for any additional information.   If you received a COVID test during your pre-op visit  it is requested that you wear a mask when out in public, stay away from anyone that may not be feeling well and notify your surgeon if you develop symptoms. If you have been in contact with anyone that has tested positive in the last 10 days please notify you surgeon.      Pre-operative CHG Bathing Instructions   You can play a key role in reducing the risk of infection after surgery. Your skin needs to be as free of germs as possible. You can reduce the number of germs on your skin by washing with CHG (chlorhexidine  gluconate) soap before surgery. CHG is an antiseptic soap that kills germs and continues to kill germs even after washing.   DO NOT use if you have an allergy to chlorhexidine /CHG or antibacterial soaps. If your skin becomes reddened or irritated, stop using the CHG and notify one of our RNs at [phone].              TAKE A SHOWER THE NIGHT BEFORE SURGERY AND THE DAY OF SURGERY    Please keep in mind the following:  You may shave your face before/day of surgery.  Place clean sheets on your bed the night before  surgery Use a clean washcloth (not used since being washed) for each shower. DO NOT sleep with pet's night before surgery.  CHG Shower Instructions:  Wash your face and private area with normal soap. If you choose to wash your hair, wash first with your normal shampoo.  After you use shampoo/soap, rinse your hair and body thoroughly to remove shampoo/soap residue.  Turn the water  OFF and apply half the bottle of CHG soap to a CLEAN washcloth.  Apply CHG soap ONLY FROM YOUR NECK DOWN TO YOUR TOES (washing for 3-5 minutes)  DO NOT use CHG soap on face, private areas, open wounds, or sores.  Pay special attention to the area where your surgery is being performed.  If you are having back surgery, having  someone wash your back for you may be helpful. Wait 2 minutes after CHG soap is applied, then you may rinse off the CHG soap.  Pat dry with a clean towel  Put on clean pajamas    Additional instructions for the day of surgery: DO NOT APPLY any lotions, deodorants or cologne.   Do not wear jewelry Do not bring valuables to the hospital. [facility] is not responsible for valuables/personal belongings. Put on clean/comfortable clothes.  Please brush your teeth.  Ask your nurse before applying any prescription medications to the skin.

## 2023-10-23 ENCOUNTER — Ambulatory Visit (HOSPITAL_COMMUNITY)
Admission: RE | Admit: 2023-10-23 | Discharge: 2023-10-23 | Disposition: A | Discharge status: Home / Self Care | Source: Ambulatory Visit | Attending: Thoracic Surgery (Cardiothoracic Vascular Surgery) | Admitting: Thoracic Surgery (Cardiothoracic Vascular Surgery)

## 2023-10-23 ENCOUNTER — Encounter (HOSPITAL_COMMUNITY)
Admission: RE | Admit: 2023-10-23 | Discharge: 2023-10-23 | Disposition: A | Discharge status: Home / Self Care | Source: Ambulatory Visit | Attending: Thoracic Surgery (Cardiothoracic Vascular Surgery) | Admitting: Thoracic Surgery (Cardiothoracic Vascular Surgery)

## 2023-10-23 ENCOUNTER — Other Ambulatory Visit: Payer: Self-pay

## 2023-10-23 ENCOUNTER — Encounter (HOSPITAL_COMMUNITY): Payer: Self-pay

## 2023-10-23 VITALS — BP 102/70 | HR 63 | Temp 97.8°F | Resp 18 | Ht 72.0 in | Wt 163.2 lb

## 2023-10-23 DIAGNOSIS — K219 Gastro-esophageal reflux disease without esophagitis: Secondary | ICD-10-CM | POA: Insufficient documentation

## 2023-10-23 DIAGNOSIS — I700 Atherosclerosis of aorta: Secondary | ICD-10-CM | POA: Diagnosis not present

## 2023-10-23 DIAGNOSIS — Z01818 Encounter for other preprocedural examination: Secondary | ICD-10-CM

## 2023-10-23 DIAGNOSIS — I10 Essential (primary) hypertension: Secondary | ICD-10-CM | POA: Diagnosis not present

## 2023-10-23 DIAGNOSIS — Z7982 Long term (current) use of aspirin: Secondary | ICD-10-CM | POA: Insufficient documentation

## 2023-10-23 DIAGNOSIS — Z981 Arthrodesis status: Secondary | ICD-10-CM | POA: Insufficient documentation

## 2023-10-23 DIAGNOSIS — E785 Hyperlipidemia, unspecified: Secondary | ICD-10-CM | POA: Diagnosis not present

## 2023-10-23 DIAGNOSIS — C3492 Malignant neoplasm of unspecified part of left bronchus or lung: Secondary | ICD-10-CM | POA: Diagnosis not present

## 2023-10-23 DIAGNOSIS — C3412 Malignant neoplasm of upper lobe, left bronchus or lung: Secondary | ICD-10-CM | POA: Insufficient documentation

## 2023-10-23 DIAGNOSIS — Z7984 Long term (current) use of oral hypoglycemic drugs: Secondary | ICD-10-CM | POA: Insufficient documentation

## 2023-10-23 DIAGNOSIS — Z951 Presence of aortocoronary bypass graft: Secondary | ICD-10-CM | POA: Insufficient documentation

## 2023-10-23 DIAGNOSIS — I452 Bifascicular block: Secondary | ICD-10-CM | POA: Diagnosis not present

## 2023-10-23 DIAGNOSIS — Z01812 Encounter for preprocedural laboratory examination: Secondary | ICD-10-CM | POA: Diagnosis present

## 2023-10-23 DIAGNOSIS — Z9889 Other specified postprocedural states: Secondary | ICD-10-CM | POA: Insufficient documentation

## 2023-10-23 DIAGNOSIS — Z01810 Encounter for preprocedural cardiovascular examination: Secondary | ICD-10-CM | POA: Diagnosis present

## 2023-10-23 DIAGNOSIS — E1169 Type 2 diabetes mellitus with other specified complication: Secondary | ICD-10-CM | POA: Diagnosis not present

## 2023-10-23 DIAGNOSIS — N400 Enlarged prostate without lower urinary tract symptoms: Secondary | ICD-10-CM | POA: Insufficient documentation

## 2023-10-23 DIAGNOSIS — Z79899 Other long term (current) drug therapy: Secondary | ICD-10-CM | POA: Insufficient documentation

## 2023-10-23 DIAGNOSIS — Z87891 Personal history of nicotine dependence: Secondary | ICD-10-CM | POA: Insufficient documentation

## 2023-10-23 DIAGNOSIS — I2510 Atherosclerotic heart disease of native coronary artery without angina pectoris: Secondary | ICD-10-CM | POA: Diagnosis not present

## 2023-10-23 DIAGNOSIS — Z01811 Encounter for preprocedural respiratory examination: Secondary | ICD-10-CM | POA: Diagnosis present

## 2023-10-23 LAB — COMPREHENSIVE METABOLIC PANEL WITH GFR
ALT: 21 U/L (ref 0–44)
AST: 25 U/L (ref 15–41)
Albumin: 4.2 g/dL (ref 3.5–5.0)
Alkaline Phosphatase: 62 U/L (ref 38–126)
Anion gap: 11 (ref 5–15)
BUN: 12 mg/dL (ref 8–23)
CO2: 27 mmol/L (ref 22–32)
Calcium: 9.6 mg/dL (ref 8.9–10.3)
Chloride: 103 mmol/L (ref 98–111)
Creatinine, Ser: 0.94 mg/dL (ref 0.61–1.24)
GFR, Estimated: >60 mL/min (ref >60)
Glucose, Bld: 121 mg/dL — ABNORMAL HIGH (ref 70–99)
Potassium: 5 mmol/L (ref 3.5–5.1)
Sodium: 141 mmol/L (ref 135–145)
Total Bilirubin: 0.3 mg/dL (ref 0.0–1.2)
Total Protein: 7.1 g/dL (ref 6.5–8.1)

## 2023-10-23 LAB — URINALYSIS, ROUTINE W REFLEX MICROSCOPIC
Bilirubin Urine: NEGATIVE
Glucose, UA: NEGATIVE mg/dL
Hgb urine dipstick: NEGATIVE
Ketones, ur: NEGATIVE mg/dL
Leukocytes,Ua: NEGATIVE
Nitrite: NEGATIVE
Protein, ur: NEGATIVE mg/dL
Specific Gravity, Urine: 1.008 (ref 1.005–1.030)
pH: 7 (ref 5.0–8.0)

## 2023-10-23 LAB — TYPE AND SCREEN
ABO/RH(D): B NEG
Antibody Screen: NEGATIVE

## 2023-10-23 LAB — PROTIME-INR
INR: 1 (ref 0.8–1.2)
Prothrombin Time: 13.8 s (ref 11.4–15.2)

## 2023-10-23 LAB — CBC
HCT: 48.4 % (ref 39.0–52.0)
Hemoglobin: 16.2 g/dL (ref 13.0–17.0)
MCH: 31 pg (ref 26.0–34.0)
MCHC: 33.5 g/dL (ref 30.0–36.0)
MCV: 92.5 fL (ref 80.0–100.0)
Platelets: 193 K/uL (ref 150–400)
RBC: 5.23 MIL/uL (ref 4.22–5.81)
RDW: 12.9 % (ref 11.5–15.5)
WBC: 8.2 K/uL (ref 4.0–10.5)
nRBC: 0 % (ref 0.0–0.2)

## 2023-10-23 LAB — SURGICAL PCR SCREEN
MRSA, PCR: NEGATIVE
Staphylococcus aureus: POSITIVE — AB

## 2023-10-23 LAB — GLUCOSE, CAPILLARY: Glucose-Capillary: 131 mg/dL — ABNORMAL HIGH (ref 70–99)

## 2023-10-23 LAB — APTT: aPTT: 27 s (ref 24–36)

## 2023-10-23 NOTE — Progress Notes (Addendum)
 PCP - Dr. Duwaine Louder Cardiologist - Dr. Lonni End, LOV 08/21/2023.  Clearance 09/19/2023  PPM/ICD - denies Device Orders - na Rep Notified - na  Chest x-ray - PAT, 10/23/2023 EKG - PAT, 10/23/2023 Stress Test - 11/13/2003 ECHO - 12/11/2022 Cardiac Cath - 2008 CABG, triple bypass: 2008  Sleep Study - denies CPAP - na  Type II diabetic. A1C 6.5 on 10/02/2023, Blood sugar 131 at PAT appointment. Monitor quit working over a month ago Fasting Blood Sugar na  Checks Blood Sugar: na  Last dose of GLP1 agonist-  denies GLP1 instructions: na  Blood Thinner Instructions: denies Aspirin Instructions: Hold DOS  ERAS Protcol - NPO  Anesthesia review: Yes. CAD, HTN, PVD, DM, COPD, RBBB, emphysema  Patient denies shortness of breath, fever, cough and chest pain at PAT appointment   All instructions explained to the patient, with a verbal understanding of the material. Patient agrees to go over the instructions while at home for a better understanding. Patient also instructed to self quarantine after being tested for COVID-19. The opportunity to ask questions was provided.

## 2023-10-24 NOTE — Anesthesia Preprocedure Evaluation (Addendum)
 Anesthesia Evaluation  Patient identified by MRN, date of birth, ID band Patient awake    Reviewed: Allergy & Precautions, NPO status , Patient's Chart, lab work & pertinent test results  History of Anesthesia Complications Negative for: history of anesthetic complications  Airway Mallampati: III  TM Distance: >3 FB Neck ROM: Full    Dental  (+) Dental Advisory Given   Pulmonary COPD, Patient abstained from smoking., former smoker   breath sounds clear to auscultation       Cardiovascular hypertension, + CAD and + Peripheral Vascular Disease  + dysrhythmias  Rhythm:Regular     Neuro/Psych    GI/Hepatic ,GERD  ,,  Endo/Other  diabetes    Renal/GU Renal disease     Musculoskeletal   Abdominal   Peds  Hematology   Anesthesia Other Findings   Reproductive/Obstetrics                              Anesthesia Physical Anesthesia Plan  ASA: 3  Anesthesia Plan: General   Post-op Pain Management:    Induction: Intravenous  PONV Risk Score and Plan: 3 and Ondansetron  and Dexamethasone   Airway Management Planned: Oral ETT  Additional Equipment: ClearSight  Intra-op Plan:   Post-operative Plan: Extubation in OR  Informed Consent: I have reviewed the patients History and Physical, chart, labs and discussed the procedure including the risks, benefits and alternatives for the proposed anesthesia with the patient or authorized representative who has indicated his/her understanding and acceptance.     Dental advisory given  Plan Discussed with:   Anesthesia Plan Comments: (PAT note written 10/24/2023 by Allison Zelenak, PA-C.  )         Anesthesia Quick Evaluation

## 2023-10-24 NOTE — Progress Notes (Signed)
 Anesthesia Chart Review:  Case: [id number] Date/Time: 10/27/23 1206   Procedure: LOBECTOMY, LUNG, ROBOT-ASSISTED, USING VATS (Left: Chest) - ROBOTIC LEFT UPPER LOBECTOMY   Anesthesia type: General   Diagnosis: Squamous cell carcinoma lung, left (HCC) [C34.92]   Pre-op diagnosis: LUL SQUAMOUS CELL CARCINOMA   [facility] / [facility]   Surgeons: Kerrin Elspeth BROCKS, MD       DISCUSSION: Patient is a 68 year old male scheduled for the above procedure. S/p bronchoscopy 05/26/2023 (inflammatory cells) and video bronchoscopy with EBN 09/29/2023 (LUL biopsy: Invasive moderately differentiated squamous cell carcinoma).   Patient is a Former Smoker (40 pack years, quit 11/21/2021). Pertinent PMH includes: CAD (s/p CABG 11/21/2006: LIMA-LAD, SVG-OM2, SVG-PDA), diastolic dysfunction, aortic ectasia, bifascicular block (RBBB + LPFB), aortic atherosclerosis, HTN, HLD, T2DM, emphysema, GERD (on daily PPI), cervical DDD (s/p C4-C6 interbody fusion), thoracic DDD, BILATERAL inguinal hernia (s/p XI Robotic Assisted bilateral IHR 01/07/2023, BPH.    Patient is followed by cardiology (End, MD). He was last seen in the cardiology clinic on 08/21/2023 by Gerard Frederick, NP.  He is s/p CABG in 2008. She noted, he has been doing well from a cardiac perspective. He denies any chest pain, shortness of breath, dyspnea exertion or peripheral edema. States that he has been compliant with his current medication regimen without any undue side effects. No further ischemic work-up felt needed at that time. Noted on-going evaluation for a pulmonary nodule. 6-8 month follow-up planned. He was felt to be acceptable risk for his most recent video bronchoscopy.   Dr. Kerrin also reported that he reached out to Dr. Mady who said patient was good to go for surgery. Dr. Asberry classified the Zubrod Score as 0: Normal activity, no symptoms.   Anesthesia team to evaluate on the day of surgery.   VS: BP 102/70   Pulse 63    Temp 36.6 C   Resp 18   Ht 6' (1.829 m)   Wt 74 kg   SpO2 98%   BMI 22.13 kg/m    PROVIDERS: Vicci Duwaine SQUIBB, DO is PCP  End, Lonni, MD is cardiologist Tamea Saunas, MD is pulmonologist   LABS: Labs reviewed: Acceptable for surgery. A1c 6.5% on 10/02/2023. (all labs ordered are listed, but only abnormal results are displayed)  Labs Reviewed  SURGICAL PCR SCREEN - Abnormal; Notable for the following components:      Result Value   Staphylococcus aureus POSITIVE (*)    All other components within normal limits  GLUCOSE, CAPILLARY - Abnormal; Notable for the following components:   Glucose-Capillary 131 (*)    All other components within normal limits  COMPREHENSIVE METABOLIC PANEL WITH GFR - Abnormal; Notable for the following components:   Glucose, Bld 121 (*)    All other components within normal limits  CBC  PROTIME-INR  APTT  URINALYSIS, ROUTINE W REFLEX MICROSCOPIC  TYPE AND SCREEN    Pulmonary function testing 05/14/2023 FVC 4.41 (93%) FEV1 3.06 (86%) FEV1 3.27 (92%) postbronchodilator RV 2.50 (103%) DLCO 19.36 (71%)   IMAGES: CXR 10/23/2023: IMPRESSION: 1. Ill-defined opacity in the left mid lung likely representing nodular density on prior CT. 2. Advanced emphysema.    NM PET IMAGE RESTAGE 10/22/2023 IMPRESSION: 1. Band-like subpleural solid process in the left upper lobe with mildly decreased but still abnormal metabolic activity; malignancy not excluded. 2. Ill-defined partially subsolid anterior left upper lobe nodule with mildly decreased but still abnormal metabolic activity. 3. Severe emphysema. 4. Small abdominal aortic aneurysm measuring 3.0  cm. 5. Substantial atherosclerosis. 6. Emphysema. 7. Cholelithiasis. 8. Prostatomegaly.   CT CHEST SUPER D 09/26/2023:  IMPRESSION: 1. Imaging for bronchoscopy planning and guidance. 2. No significant change in ill-defined subpleural nodular density anteriorly in the left upper lobe with  surrounding less well-defined parenchymal opacities. This remains suspicious for bronchogenic carcinoma based on previous PET-CT. 3. No evidence of metastatic disease. 4. Cholelithiasis. 5. Aortic Atherosclerosis (ICD10-I70.0) and Emphysema (ICD10-J43.9).    EKG: 10/23/2023: Sinus bradycardia at 59 bpm  Right bundle branch block (old) Abnormal ECG Confirmed by Darliss Rogue ([id number]) on 10/23/2023 7:55:47 PM   CV: TRANSTHORACIC ECHOCARDIOGRAM 12/11/2022 Left ventricular ejection fraction, by estimation, is 60 to 65%. The left ventricle has normal function. The left ventricle has no regional wall motion abnormalities. Left ventricular diastolic parameters are consistent with Grade I diastolic dysfunction (impaired relaxation).  Right ventricular systolic function is normal. The right ventricular size is normal. Tricuspid regurgitation signal is inadequate for assessing  PA pressure.   The mitral valve is normal in structure. Mild mitral valve regurgitation. No evidence of mitral stenosis.  Tricuspid valve regurgitation is mild to moderate.  The aortic valve has an indeterminant number of cusps. There is mild calcification of the aortic valve. Aortic valve regurgitation is not visualized. Aortic valve sclerosis/calcification is present, without any evidence of aortic stenosis. Aortic valve mean gradient measures 4.0 mmHg.  The inferior vena cava is normal in size with greater than 50% respiratory variability, suggesting right atrial pressure of 3 mmHg.    US  AORTA 09/18/2022 Ectatic abdominal aorta, 2.6 cm. Five year follow up is recommended.      Past Medical History:  Diagnosis Date   Abdominal aortic ectasia 08/29/2022   a.) US  aorta 08/29/2022: 2.6 cm   Aortic atherosclerosis    Bilateral inguinal hernia    BPH with elevated PSA and lower urinary tract symptoms    CAD (coronary artery disease) 11/13/2006   a.) LHC 11/13/2006: 90% mLAD, 70% D1, 60% D2, 70% pLCx, 100% pRCA, 95%  RV marginal - refer to CVTS; b.) s/p 3v CABG 11/21/2006   Cholelithiasis    Colon polyps 01/21/2009   DDD (degenerative disc disease), cervical    a.) s/p C4-C6 discectomy with interbody fusion and anterior plating 06/29/2001   DDD (degenerative disc disease), thoracic    Diastolic dysfunction    a.) TTE 12/11/2022: EF 60-65%, no RWMAs, G1DD, norm RVSF, mild MR, mild-mod TR, AoV sclerosis without stenosis.   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    S/P CABG x 3 11/21/2006   a.) LIMA-LAD, SVG-OM2, SVG-PDA   T2DM (type 2 diabetes mellitus) (HCC)    Vapes nicotine containing substance     Past Surgical History:  Procedure Laterality Date   BRONCHOSCOPY, WITH BIOPSY USING ELECTROMAGNETIC NAVIGATION Left 05/26/2023   Procedure: BRONCHOSCOPY, WITH BIOPSY USING ELECTROMAGNETIC NAVIGATION;  Surgeon: Tamea Dedra CROME, MD;  Location: [facility];  Service: Pulmonary;  Laterality: Left;   CARDIAC CATHETERIZATION     CATARACT EXTRACTION W/ INTRAOCULAR LENS IMPLANT Right 03/04/2022   CERVICAL FUSION N/A 06/29/2001   COLONOSCOPY     COLONOSCOPY N/A 09/14/2020   Procedure: COLONOSCOPY;  Surgeon: Jinny Carmine, MD;  Location: [facility];  Service: Endoscopy;  Laterality: N/A;   COLONOSCOPY WITH PROPOFOL  N/A 07/24/2015   Procedure: COLONOSCOPY WITH PROPOFOL ;  Surgeon: Carmine Jinny, MD;  Location: [facility];  Service: Endoscopy;  Laterality: N/A;   CORONARY ARTERY BYPASS GRAFT  11/21/2006    Procedure: CORONARY ARTERY BYPASS GRAFT; Location: [facility]; Surgeon: Lajoyce Farr, MD   ENDOBRONCHIAL ULTRASOUND Left 09/29/2023   Procedure: ENDOBRONCHIAL ULTRASOUND (EBUS);  Surgeon: Tamea Dedra CROME, MD;  Location: [facility];  Service: Pulmonary;  Laterality: Left;   HERNIA REPAIR Bilateral    with mesh   INSERTION OF MESH  01/07/2023   Procedure: INSERTION OF MESH;  Surgeon: Jordis Laneta FALCON, MD;  Location: [facility];  Service: General;;   LEFT HEART CATH AND CORONARY ANGIOGRAPHY Left 11/13/2006   Procedure: LEFT HEART CATH AND CORONARY ANGIOGRAPHY; Location: [facility]; Surgeon: Denyse Bathe, MD   POLYPECTOMY  07/24/2015   Procedure: POLYPECTOMY;  Surgeon: Carmine Jinny, MD;  Location: [facility];  Service: Endoscopy;;   VIDEO BRONCHOSCOPY WITH ENDOBRONCHIAL NAVIGATION Left 09/29/2023   Procedure: VIDEO BRONCHOSCOPY WITH ENDOBRONCHIAL NAVIGATION;  Surgeon: Tamea Dedra CROME, MD;  Location: [facility];  Service: Pulmonary;  Laterality: Left;   VIDEO BRONCHOSCOPY WITH ENDOBRONCHIAL ULTRASOUND Left 05/26/2023   Procedure: BRONCHOSCOPY, WITH EBUS;  Surgeon: Tamea Dedra CROME, MD;  Location: [facility];  Service: Pulmonary;  Laterality: Left;    MEDICATIONS:  amLODipine  (NORVASC ) 10 MG tablet   aspirin EC 81 MG tablet   ibuprofen (ADVIL) 200 MG tablet   metFORMIN  (GLUCOPHAGE ) 500 MG tablet   metoprolol  succinate (TOPROL -XL) 100 MG 24 hr tablet   Multiple Vitamins-Minerals (MULTIVITAMIN ADULT PO)   omega-3 acid ethyl esters (LOVAZA ) 1 g capsule   omeprazole  (PRILOSEC) 20 MG capsule   rosuvastatin  (CRESTOR ) 40 MG tablet   tamsulosin  (FLOMAX ) 0.4 MG CAPS capsule   No current facility-administered medications for this encounter.   Isaiah Ruder, PA-C Surgical Short Stay/Anesthesiology [facility] Phone [phone] [facility] Phone [phone] 10/24/2023 11:47 AM

## 2023-10-27 ENCOUNTER — Other Ambulatory Visit: Payer: Self-pay

## 2023-10-27 ENCOUNTER — Inpatient Hospital Stay (HOSPITAL_COMMUNITY): Admitting: Certified Registered Nurse Anesthetist

## 2023-10-27 ENCOUNTER — Inpatient Hospital Stay (HOSPITAL_COMMUNITY)

## 2023-10-27 ENCOUNTER — Inpatient Hospital Stay (HOSPITAL_COMMUNITY)
Admission: RE | Admit: 2023-10-27 | Discharge: 2023-10-31 | DRG: 164 | Disposition: A | Discharge status: Home / Self Care | Attending: Thoracic Surgery (Cardiothoracic Vascular Surgery) | Admitting: Thoracic Surgery (Cardiothoracic Vascular Surgery)

## 2023-10-27 ENCOUNTER — Encounter (HOSPITAL_COMMUNITY)
Admission: RE | Disposition: A | Discharge status: Home / Self Care | Payer: Self-pay | Source: Home / Self Care | Attending: Thoracic Surgery (Cardiothoracic Vascular Surgery)

## 2023-10-27 ENCOUNTER — Inpatient Hospital Stay (HOSPITAL_COMMUNITY): Payer: Self-pay | Admitting: Vascular Surgery

## 2023-10-27 DIAGNOSIS — I10 Essential (primary) hypertension: Secondary | ICD-10-CM | POA: Diagnosis not present

## 2023-10-27 DIAGNOSIS — Z9889 Other specified postprocedural states: Principal | ICD-10-CM

## 2023-10-27 DIAGNOSIS — Z7984 Long term (current) use of oral hypoglycemic drugs: Secondary | ICD-10-CM | POA: Diagnosis not present

## 2023-10-27 DIAGNOSIS — Z7982 Long term (current) use of aspirin: Secondary | ICD-10-CM | POA: Diagnosis not present

## 2023-10-27 DIAGNOSIS — E785 Hyperlipidemia, unspecified: Secondary | ICD-10-CM | POA: Diagnosis not present

## 2023-10-27 DIAGNOSIS — Z8249 Family history of ischemic heart disease and other diseases of the circulatory system: Secondary | ICD-10-CM

## 2023-10-27 DIAGNOSIS — J439 Emphysema, unspecified: Secondary | ICD-10-CM | POA: Diagnosis present

## 2023-10-27 DIAGNOSIS — E1151 Type 2 diabetes mellitus with diabetic peripheral angiopathy without gangrene: Secondary | ICD-10-CM | POA: Diagnosis present

## 2023-10-27 DIAGNOSIS — C3492 Malignant neoplasm of unspecified part of left bronchus or lung: Secondary | ICD-10-CM

## 2023-10-27 DIAGNOSIS — Z951 Presence of aortocoronary bypass graft: Secondary | ICD-10-CM

## 2023-10-27 DIAGNOSIS — R000 Tachycardia, unspecified: Secondary | ICD-10-CM | POA: Diagnosis not present

## 2023-10-27 DIAGNOSIS — J9382 Other air leak: Secondary | ICD-10-CM | POA: Diagnosis not present

## 2023-10-27 DIAGNOSIS — I2510 Atherosclerotic heart disease of native coronary artery without angina pectoris: Secondary | ICD-10-CM | POA: Diagnosis present

## 2023-10-27 DIAGNOSIS — F17210 Nicotine dependence, cigarettes, uncomplicated: Secondary | ICD-10-CM | POA: Diagnosis present

## 2023-10-27 DIAGNOSIS — Z902 Acquired absence of lung [part of]: Secondary | ICD-10-CM

## 2023-10-27 DIAGNOSIS — C3412 Malignant neoplasm of upper lobe, left bronchus or lung: Secondary | ICD-10-CM | POA: Diagnosis not present

## 2023-10-27 DIAGNOSIS — F17290 Nicotine dependence, other tobacco product, uncomplicated: Secondary | ICD-10-CM | POA: Diagnosis present

## 2023-10-27 DIAGNOSIS — E1169 Type 2 diabetes mellitus with other specified complication: Secondary | ICD-10-CM

## 2023-10-27 HISTORY — PX: LYMPH NODE BIOPSY: SHX201

## 2023-10-27 HISTORY — PX: LOBECTOMY, LUNG, ROBOT-ASSISTED, USING VATS: SHX7607

## 2023-10-27 HISTORY — PX: INTERCOSTAL NERVE BLOCK: SHX5021

## 2023-10-27 LAB — ABO/RH: ABO/RH(D): B NEG

## 2023-10-27 LAB — GLUCOSE, CAPILLARY
Glucose-Capillary: 180 mg/dL — ABNORMAL HIGH (ref 70–99)
Glucose-Capillary: 164 mg/dL — ABNORMAL HIGH (ref 70–99)
Glucose-Capillary: 242 mg/dL — ABNORMAL HIGH (ref 70–99)

## 2023-10-27 SURGERY — LOBECTOMY, LUNG, ROBOT-ASSISTED, USING VATS
Anesthesia: General | Site: Chest | Laterality: Left

## 2023-10-27 MED ORDER — ROSUVASTATIN CALCIUM 20 MG PO TABS
40.0000 mg | ORAL_TABLET | Freq: Every day | ORAL | Status: DC
  Administered 2023-10-28 – 2023-10-31 (×4): 40 mg via ORAL
  Filled 2023-10-27 (×4): qty 2

## 2023-10-27 MED ORDER — OXYCODONE HCL 5 MG PO TABS: 5.0000 mg | ORAL_TABLET | ORAL | Status: DC | PRN

## 2023-10-27 MED ORDER — ACETAMINOPHEN 160 MG/5ML PO SOLN: 1000.0000 mg | Freq: Four times a day (QID) | ORAL | Status: DC

## 2023-10-27 MED ORDER — PHENYLEPHRINE HCL-NACL 20-0.9 MG/250ML-% IV SOLN
INTRAVENOUS | Status: DC | PRN
  Administered 2023-10-27: 30 ug/min via INTRAVENOUS

## 2023-10-27 MED ORDER — BUPIVACAINE LIPOSOME 1.3 % IJ SUSP
INTRAMUSCULAR | Status: AC
  Filled 2023-10-27: qty 20

## 2023-10-27 MED ORDER — INSULIN ASPART 100 UNIT/ML IJ SOLN
0.0000 [IU] | Freq: Three times a day (TID) | INTRAMUSCULAR | Status: DC
  Administered 2023-10-27: 8 [IU] via SUBCUTANEOUS
  Administered 2023-10-28: 2 [IU] via SUBCUTANEOUS
  Administered 2023-10-28 (×2): 4 [IU] via SUBCUTANEOUS
  Administered 2023-10-29 – 2023-10-30 (×5): 2 [IU] via SUBCUTANEOUS
  Administered 2023-10-30: 4 [IU] via SUBCUTANEOUS
  Administered 2023-10-31: 2 [IU] via SUBCUTANEOUS

## 2023-10-27 MED ORDER — FENTANYL CITRATE (PF) 250 MCG/5ML IJ SOLN
INTRAMUSCULAR | Status: AC
  Filled 2023-10-27: qty 5

## 2023-10-27 MED ORDER — SODIUM CHLORIDE FLUSH 0.9 % IV SOLN
INTRAVENOUS | Status: DC | PRN
  Administered 2023-10-27: 100 mL

## 2023-10-27 MED ORDER — CEFAZOLIN SODIUM-DEXTROSE 2-4 GM/100ML-% IV SOLN
2.0000 g | INTRAVENOUS | Status: AC
  Administered 2023-10-27: 2 g via INTRAVENOUS

## 2023-10-27 MED ORDER — PHENYLEPHRINE 80 MCG/ML (10ML) SYRINGE FOR IV PUSH (FOR BLOOD PRESSURE SUPPORT)
PREFILLED_SYRINGE | INTRAVENOUS | Status: DC | PRN
  Administered 2023-10-27: 80 ug via INTRAVENOUS

## 2023-10-27 MED ORDER — GABAPENTIN 300 MG PO CAPS
300.0000 mg | ORAL_CAPSULE | Freq: Two times a day (BID) | ORAL | Status: DC
  Administered 2023-10-30 – 2023-10-31 (×2): 300 mg via ORAL
  Filled 2023-10-27 (×2): qty 1

## 2023-10-27 MED ORDER — MORPHINE SULFATE (PF) 2 MG/ML IV SOLN: 2.0000 mg | INTRAVENOUS | Status: DC | PRN

## 2023-10-27 MED ORDER — KETOROLAC TROMETHAMINE 15 MG/ML IJ SOLN
15.0000 mg | Freq: Four times a day (QID) | INTRAMUSCULAR | Status: DC
  Administered 2023-10-27 – 2023-10-29 (×6): 15 mg via INTRAVENOUS
  Filled 2023-10-27 (×6): qty 1

## 2023-10-27 MED ORDER — PROPOFOL 10 MG/ML IV BOLUS
INTRAVENOUS | Status: DC | PRN
  Administered 2023-10-27: 110 mg via INTRAVENOUS

## 2023-10-27 MED ORDER — FENTANYL CITRATE (PF) 250 MCG/5ML IJ SOLN
INTRAMUSCULAR | Status: DC | PRN
  Administered 2023-10-27: 150 ug via INTRAVENOUS
  Administered 2023-10-27: 50 ug via INTRAVENOUS

## 2023-10-27 MED ORDER — SUGAMMADEX SODIUM 200 MG/2ML IV SOLN
INTRAVENOUS | Status: DC | PRN
  Administered 2023-10-27: 200 mg via INTRAVENOUS

## 2023-10-27 MED ORDER — EPHEDRINE SULFATE-NACL 50-0.9 MG/10ML-% IV SOSY
PREFILLED_SYRINGE | INTRAVENOUS | Status: DC | PRN
  Administered 2023-10-27: 7.5 mg via INTRAVENOUS
  Administered 2023-10-27 (×2): 5 mg via INTRAVENOUS

## 2023-10-27 MED ORDER — AMLODIPINE BESYLATE 10 MG PO TABS
10.0000 mg | ORAL_TABLET | Freq: Every day | ORAL | Status: DC
  Administered 2023-10-28 – 2023-10-31 (×4): 10 mg via ORAL
  Filled 2023-10-27 (×4): qty 1

## 2023-10-27 MED ORDER — ORAL CARE MOUTH RINSE: 15.0000 mL | Freq: Once | OROMUCOSAL | Status: AC

## 2023-10-27 MED ORDER — TRAMADOL HCL 50 MG PO TABS: 50.0000 mg | ORAL_TABLET | Freq: Four times a day (QID) | ORAL | Status: DC | PRN

## 2023-10-27 MED ORDER — ALBUMIN HUMAN 5 % IV SOLN: INTRAVENOUS | Status: DC | PRN

## 2023-10-27 MED ORDER — ROCURONIUM BROMIDE 100 MG/10ML IV SOLN
INTRAVENOUS | Status: DC | PRN
  Administered 2023-10-27: 20 mg via INTRAVENOUS
  Administered 2023-10-27: 10 mg via INTRAVENOUS
  Administered 2023-10-27: 70 mg via INTRAVENOUS

## 2023-10-27 MED ORDER — ACETAMINOPHEN 500 MG PO TABS
1000.0000 mg | ORAL_TABLET | Freq: Four times a day (QID) | ORAL | Status: DC
  Administered 2023-10-27 – 2023-10-31 (×12): 1000 mg via ORAL
  Filled 2023-10-27 (×13): qty 2

## 2023-10-27 MED ORDER — TAMSULOSIN HCL 0.4 MG PO CAPS
0.8000 mg | ORAL_CAPSULE | Freq: Every day | ORAL | Status: DC
  Administered 2023-10-28 – 2023-10-31 (×4): 0.8 mg via ORAL
  Filled 2023-10-27 (×4): qty 2

## 2023-10-27 MED ORDER — CEFAZOLIN SODIUM-DEXTROSE 2-4 GM/100ML-% IV SOLN
INTRAVENOUS | Status: AC
  Filled 2023-10-27: qty 100

## 2023-10-27 MED ORDER — SENNOSIDES-DOCUSATE SODIUM 8.6-50 MG PO TABS
1.0000 | ORAL_TABLET | Freq: Every day | ORAL | Status: DC
  Administered 2023-10-28 – 2023-10-30 (×3): 1 via ORAL
  Filled 2023-10-27 (×3): qty 1

## 2023-10-27 MED ORDER — PANTOPRAZOLE SODIUM 40 MG PO TBEC
40.0000 mg | DELAYED_RELEASE_TABLET | Freq: Every day | ORAL | Status: DC
  Administered 2023-10-28 – 2023-10-31 (×4): 40 mg via ORAL
  Filled 2023-10-27 (×4): qty 1

## 2023-10-27 MED ORDER — SODIUM CHLORIDE (PF) 0.9 % IJ SOLN
INTRAMUSCULAR | Status: AC
Start: 2023-10-27 — End: 2023-10-27
  Filled 2023-10-27: qty 50

## 2023-10-27 MED ORDER — ONDANSETRON HCL 4 MG/2ML IJ SOLN: 4.0000 mg | Freq: Four times a day (QID) | INTRAMUSCULAR | Status: DC | PRN

## 2023-10-27 MED ORDER — ONDANSETRON HCL 4 MG/2ML IJ SOLN
INTRAMUSCULAR | Status: DC | PRN
  Administered 2023-10-27: 4 mg via INTRAVENOUS

## 2023-10-27 MED ORDER — BUPIVACAINE HCL (PF) 0.5 % IJ SOLN
INTRAMUSCULAR | Status: AC
  Filled 2023-10-27: qty 30

## 2023-10-27 MED ORDER — ENOXAPARIN SODIUM 40 MG/0.4ML IJ SOSY
40.0000 mg | PREFILLED_SYRINGE | Freq: Every day | INTRAMUSCULAR | Status: DC
  Administered 2023-10-28 – 2023-10-31 (×4): 40 mg via SUBCUTANEOUS
  Filled 2023-10-27 (×4): qty 0.4

## 2023-10-27 MED ORDER — SODIUM CHLORIDE 0.9 % IV SOLN
INTRAVENOUS | Status: DC
Start: 2023-10-27 — End: 2023-10-28

## 2023-10-27 MED ORDER — ASPIRIN 81 MG PO TBEC
81.0000 mg | DELAYED_RELEASE_TABLET | Freq: Every day | ORAL | Status: DC
  Administered 2023-10-28 – 2023-10-31 (×4): 81 mg via ORAL
  Filled 2023-10-27 (×4): qty 1

## 2023-10-27 MED ORDER — CHLORHEXIDINE GLUCONATE 0.12 % MT SOLN
OROMUCOSAL | Status: AC
  Administered 2023-10-27: 15 mL via OROMUCOSAL
  Filled 2023-10-27: qty 15

## 2023-10-27 MED ORDER — GABAPENTIN 300 MG PO CAPS
300.0000 mg | ORAL_CAPSULE | Freq: Every day | ORAL | Status: AC
  Administered 2023-10-27 – 2023-10-29 (×3): 300 mg via ORAL
  Filled 2023-10-27 (×3): qty 1

## 2023-10-27 MED ORDER — LACTATED RINGERS IV SOLN: INTRAVENOUS | Status: DC

## 2023-10-27 MED ORDER — CHLORHEXIDINE GLUCONATE 0.12 % MT SOLN: 15.0000 mL | Freq: Once | OROMUCOSAL | Status: AC

## 2023-10-27 MED ORDER — BISACODYL 5 MG PO TBEC
10.0000 mg | DELAYED_RELEASE_TABLET | Freq: Every day | ORAL | Status: DC
  Administered 2023-10-28 – 2023-10-31 (×4): 10 mg via ORAL
  Filled 2023-10-27 (×4): qty 2

## 2023-10-27 MED ORDER — CEFAZOLIN SODIUM-DEXTROSE 2-4 GM/100ML-% IV SOLN
2.0000 g | Freq: Three times a day (TID) | INTRAVENOUS | Status: AC
  Administered 2023-10-27 – 2023-10-28 (×2): 2 g via INTRAVENOUS
  Filled 2023-10-27 (×2): qty 100

## 2023-10-27 MED ORDER — SODIUM CHLORIDE 0.9% IV SOLUTION
INTRAVENOUS | Status: AC | PRN
  Administered 2023-10-27: 1000 mL

## 2023-10-27 MED ORDER — 0.9 % SODIUM CHLORIDE (POUR BTL) OPTIME
TOPICAL | Status: DC | PRN
  Administered 2023-10-27: 2000 mL

## 2023-10-27 SURGICAL SUPPLY — 63 items
CANISTER SUCTION 3000ML PPV (SUCTIONS) ×6 IMPLANT
CANNULA REDUCER 12-8 DVNC XI (CANNULA) ×6 IMPLANT
CNTNR URN SCR LID CUP LEK RST (MISCELLANEOUS) ×15 IMPLANT
CONN ST 1/4X3/8 BEN (MISCELLANEOUS) IMPLANT
DEFOGGER SCOPE WARM SEASHARP (MISCELLANEOUS) ×3 IMPLANT
DERMABOND ADVANCED .7 DNX12 (GAUZE/BANDAGES/DRESSINGS) ×3 IMPLANT
DRAIN CHANNEL 28F RND 3/8 FF (WOUND CARE) IMPLANT
DRAPE ARM DVNC X/XI (DISPOSABLE) ×12 IMPLANT
DRAPE COLUMN DVNC XI (DISPOSABLE) ×3 IMPLANT
DRAPE CV SPLIT W-CLR ANES SCRN (DRAPES) ×3 IMPLANT
DRAPE HALF SHEET 40X57 (DRAPES) ×3 IMPLANT
DRAPE INCISE IOBAN 66X45 STRL (DRAPES) IMPLANT
DRAPE SURG ORHT 6 SPLT 77X108 (DRAPES) ×3 IMPLANT
ELECT BLADE 6.5 EXT (BLADE) ×3 IMPLANT
ELECTRODE REM PT RTRN 9FT ADLT (ELECTROSURGICAL) ×3 IMPLANT
FORCEPS BPLR FENES DVNC XI (FORCEP) IMPLANT
FORCEPS BPLR LNG DVNC XI (INSTRUMENTS) IMPLANT
GAUZE KITTNER 4X5 RF (MISCELLANEOUS) ×6 IMPLANT
GAUZE SPONGE 4X4 12PLY STRL (GAUZE/BANDAGES/DRESSINGS) ×3 IMPLANT
GAUZE SPONGE 4X4 12PLY STRL LF (GAUZE/BANDAGES/DRESSINGS) IMPLANT
GLOVE SS BIOGEL STRL SZ 7.5 (GLOVE) ×6 IMPLANT
GLOVE SURG POLYISO LF SZ8 (GLOVE) ×3 IMPLANT
GOWN STRL REUS W/ TWL LRG LVL3 (GOWN DISPOSABLE) ×6 IMPLANT
GOWN STRL REUS W/ TWL XL LVL3 (GOWN DISPOSABLE) ×6 IMPLANT
GOWN STRL REUS W/TWL 2XL LVL3 (GOWN DISPOSABLE) ×3 IMPLANT
GRASPER TIP-UP FEN DVNC XI (INSTRUMENTS) IMPLANT
HEMOSTAT SURGICEL 2X14 (HEMOSTASIS) ×9 IMPLANT
IRRIGATION STRYKERFLOW (MISCELLANEOUS) ×3 IMPLANT
KIT BASIN OR (CUSTOM PROCEDURE TRAY) ×3 IMPLANT
KIT TURNOVER KIT B (KITS) ×3 IMPLANT
NDL HYPO 25GX1X1/2 BEV (NEEDLE) ×3 IMPLANT
NDL SPNL 22GX3.5 QUINCKE BK (NEEDLE) ×3 IMPLANT
NEEDLE HYPO 25GX1X1/2 BEV (NEEDLE) ×3 IMPLANT
NEEDLE SPNL 22GX3.5 QUINCKE BK (NEEDLE) ×3 IMPLANT
PACK CHEST (CUSTOM PROCEDURE TRAY) ×3 IMPLANT
PAD ARMBOARD POSITIONER FOAM (MISCELLANEOUS) ×6 IMPLANT
PORT ACCESS TROCAR AIRSEAL 12 (TROCAR) ×3 IMPLANT
RELOAD STAPLE 45 2.5 WHT DVNC (STAPLE) IMPLANT
RELOAD STAPLE 45 3.5 BLU DVNC (STAPLE) IMPLANT
RELOAD STAPLE 45 4.3 GRN DVNC (STAPLE) IMPLANT
SEAL UNIV 5-12 XI (MISCELLANEOUS) ×12 IMPLANT
SET TRI-LUMEN FLTR TB AIRSEAL (TUBING) ×3 IMPLANT
SOLN 0.9% NACL 1000 ML (IV SOLUTION) ×6 IMPLANT
SOLN 0.9% NACL POUR BTL 1000ML (IV SOLUTION) ×6 IMPLANT
SOLN STERILE WATER 1000 ML (IV SOLUTION) ×6 IMPLANT
SOLN STERILE WATER BTL 1000 ML (IV SOLUTION) ×6 IMPLANT
SOLUTION ELECTROSURG ANTI STCK (MISCELLANEOUS) ×3 IMPLANT
SPONGE TONSIL 1 RF SGL (DISPOSABLE) IMPLANT
STAPLER 45 SUREFORM CVD DVNC (STAPLE) IMPLANT
SUT PROLENE 4-0 RB1 .5 CRCL 36 (SUTURE) IMPLANT
SUT SILK 1 MH (SUTURE) ×3 IMPLANT
SUT SILK 2 0 SH (SUTURE) ×3 IMPLANT
SUT VIC AB 1 CTX36XBRD ANBCTR (SUTURE) ×3 IMPLANT
SUT VIC AB 2-0 CTX 36 (SUTURE) ×3 IMPLANT
SUT VIC AB 3-0 X1 27 (SUTURE) ×6 IMPLANT
SUT VICRYL 0 TIES 12 18 (SUTURE) ×3 IMPLANT
SUT VICRYL 0 UR6 27IN ABS (SUTURE) ×6 IMPLANT
SYR 20CC LL (SYRINGE) ×6 IMPLANT
SYSTEM RETRIEVAL ANCHOR 15 (MISCELLANEOUS) IMPLANT
SYSTEM SAHARA CHEST DRAIN ATS (WOUND CARE) ×3 IMPLANT
TAPE CLOTH 4X10 WHT NS (GAUZE/BANDAGES/DRESSINGS) ×3 IMPLANT
TOWEL GREEN STERILE (TOWEL DISPOSABLE) ×3 IMPLANT
TRAY FOLEY MTR SLVR 16FR STAT (SET/KITS/TRAYS/PACK) ×3 IMPLANT

## 2023-10-27 NOTE — Progress Notes (Signed)
 No diabetic protocol at this time per Dr. Leopoldo.

## 2023-10-27 NOTE — Hospital Course (Addendum)
 PCP is Vicci Duwaine SQUIBB, DO Referring Provider is Tamea Dedra LITTIE CHRISTELLA     History of Present Illness:   Maxwell Young several consultation regarding squamous cell carcinoma of the left upper lobe.   Maxwell Young is a 68 year old man with a history of tobacco abuse, emphysema, CAD, status post CABG, diastolic dysfunction, aortic atherosclerosis, type 2 diabetes, hypertension, hyperlipidemia, PAD, BPH, degenerative disc disease, cholelithiasis, and nephrolithiasis.   Followed in the low-dose CT screening program.  In February he had a low-dose CT which showed a new 12.8 mm nodule in the anterior left upper lobe.  PET/CT showed the nodule was hypermetabolic with an SUV of 4.9.  There was some adjacent activity with SUV of 7.2 but no discrete nodule in that area.  He underwent robotic bronchoscopy in May.  Inflammatory cells were noted but no malignancy.   A follow-up CT was done in August.  It showed progression of the anterior nodule to 18 mm.  There was a more subsolid appearing nodule in the other area of activity on the previous PET.   He underwent a repeat robotic bronchoscopy.  Biopsies of the primary nodule were again nondiagnostic but biopsies of the more subsolid lesion showed squamous cell carcinoma.   He smoked about a pack of cigarettes daily for 45 years before quitting in 2023.  He has continued to use a vape device.  Denies any shortness of breath, cough, or wheezing.  Does not require inhalers.  No change in appetite but has lost a little weight which she attributes to diabetes.  Had coronary bypass grafting at [facility] in 2008.  Followed by Dr. Mady.  No chest pain, pressure, tightness.  Hospital Course:  Maxwell Young was admitted to the hospital for elective surgery on 10/27/2023.  He was taken to the operating room where robotic assisted left upper lobectomy was carried out without complication.  Intercostal nerve block was also accomplished using Exparel  along with mediastinal lymph  node sampling.  Following procedure, he was awakened, extubated, and transferred to the postanesthesia care unit in stable condition.

## 2023-10-27 NOTE — Interval H&P Note (Signed)
 History and Physical Interval Note:  10/27/2023 10:32 AM  Maxwell Young  has presented today for surgery, with the diagnosis of LUL SQUAMOUS CELL CARCINOMA.  The various methods of treatment have been discussed with the patient and family. After consideration of risks, benefits and other options for treatment, the patient has consented to  Procedure(s) with comments: LOBECTOMY, LUNG, ROBOT-ASSISTED, USING VATS (Left) - ROBOTIC LEFT UPPER LOBECTOMY as a surgical intervention.  The patient's history has been reviewed, patient examined, no change in status, stable for surgery.  I have reviewed the patient's chart and labs.  Questions were answered to the patient's satisfaction.     Maxwell Young

## 2023-10-27 NOTE — Brief Op Note (Signed)
 10/27/2023  3:07 PM  PATIENT:  Maxwell Young House  68 y.o. male  PRE-OPERATIVE DIAGNOSIS:  LUL SQUAMOUS CELL CARCINOMA  POST-OPERATIVE DIAGNOSIS:  LUL SQUAMOUS CELL CARCINOMA  PROCEDURE:   LOBECTOMY, LUNG, LEFT UPPER ROBOT-ASSISTED, USING VATS (Left) - ROBOTIC LEFT UPPER LOBECTOMY  BLOCK, NERVE, INTERCOSTAL  LYMPH NODE BIOPSY   SURGEON:  Kerrin Elspeth BROCKS, MD - Primary  PHYSICIAN ASSISTANT: Jarquis Walker  ASSISTANTS: Arlee Peggyann HERO, RN, Circulator                     Williams, Jaime L, CST, Scrub Person   ANESTHESIA:   general  EBL:  300 mL   BLOOD ADMINISTERED:none  DRAINS: 6fr Blake to left pleural space   LOCAL MEDICATIONS USED:  Exparel   SPECIMEN:  left upper lung lobe, multiple lymph nodes  DISPOSITION OF SPECIMEN:  PATHOLOGY  COUNTS:  Correct  DICTATION: .Dragon Dictation  PLAN OF CARE: Admit to inpatient   PATIENT DISPOSITION:  PACU - hemodynamically stable.   Delay start of Pharmacological VTE agent (>24hrs) due to surgical blood loss or risk of bleeding: no

## 2023-10-27 NOTE — Discharge Instructions (Signed)
 Discharge Instructions:  1. You may shower, please wash incisions daily with soap and water  and keep dry.  If you wish to cover wounds with dressing you may do so but please keep clean and change daily.  No tub baths or swimming until incisions have completely healed.  If your incisions become red or develop any drainage please call our office at [phone]  2. No Driving until cleared by Dr. Chrystal office and you are no longer using narcotic pain medications  3. Monitor your weight daily.. Please use the same scale and weigh at same time... If you gain 5-10 lbs in 48 hours with associated lower extremity swelling, please contact our office at [phone]  4. Fever of 101.5 for at least 24 hours with no source, please contact our office at [phone]  5. Activity- up as tolerated, please walk at least 3 times per day.  Avoid strenuous activity, no lifting, pushing, or pulling with your arms over 8-10 lbs for a minimum of 6 weeks  6. If any questions or concerns arise, please do not hesitate to contact our office at [phone]

## 2023-10-27 NOTE — Plan of Care (Signed)
  Problem: Clinical Measurements: Goal: Diagnostic test results will improve Outcome: Progressing Goal: Respiratory complications will improve Outcome: Progressing Goal: Cardiovascular complication will be avoided Outcome: Progressing   Problem: Coping: Goal: Level of anxiety will decrease Outcome: Progressing   Problem: Pain Managment: Goal: General experience of comfort will improve and/or be controlled Outcome: Progressing   Problem: Safety: Goal: Ability to remain free from injury will improve Outcome: Progressing

## 2023-10-27 NOTE — Anesthesia Procedure Notes (Signed)
 Arterial Line Insertion Start/End10/06/2023 10:45 AM, 10/27/2023 10:48 AM Performed by: Harrold Macintosh, CRNA, CRNA  Patient location: Pre-op. Preanesthetic checklist: patient identified, IV checked, site marked, risks and benefits discussed, surgical consent, monitors and equipment checked, pre-op evaluation, timeout performed and anesthesia consent Lidocaine  1% used for infiltration Right, radial was placed Catheter size: 20 G Hand hygiene performed  and maximum sterile barriers used  Allen's test indicative of satisfactory collateral circulation Attempts: 1 Procedure performed using ultrasound to evaluate access site. Ultrasound Notes:relevant anatomy identified, ultrasound used to visualize needle entry, vessel patent under ultrasound and image(s) printed for medical record. Following insertion, dressing applied and Biopatch. Post procedure assessment: normal and unchanged  Patient tolerated the procedure well with no immediate complications.

## 2023-10-27 NOTE — Discharge Summary (Addendum)
 Physician Discharge Summary  Patient ID: DEMETRE MONACO MRN: [id number] DOB/AGE: 1955-10-03 68 y.o.  Admit date: 10/27/2023 Discharge date: 10/27/2023  Admission Diagnoses:  Squamous cell carcinoma of the left lung Type 2 diabetes Chronic obstructive pulmonary disease Coronary artery disease status post coronary bypass grafting Hypertension Dyslipidemia History of tobacco smoking Peripheral arterial disease Benign prostatic hyperplasia  Discharge Diagnoses:   Squamous cell carcinoma of the left lung Type 2 diabetes Chronic obstructive pulmonary disease Coronary artery disease status post coronary bypass grafting Hypertension Dyslipidemia History of tobacco smoking Peripheral arterial disease Benign prostatic hyperplasia  Discharged Condition: {condition:18240}  PCP is Vicci Duwaine SQUIBB, DO Referring Provider is Tamea Dedra LITTIE CHRISTELLA     History of Present Illness:   Mr. Leavy several consultation regarding squamous cell carcinoma of the left upper lobe.   Daveon Arpino is a 68 year old man with a history of tobacco abuse, emphysema, CAD, status post CABG, diastolic dysfunction, aortic atherosclerosis, type 2 diabetes, hypertension, hyperlipidemia, PAD, BPH, degenerative disc disease, cholelithiasis, and nephrolithiasis.   Followed in the low-dose CT screening program.  In February he had a low-dose CT which showed a new 12.8 mm nodule in the anterior left upper lobe.  PET/CT showed the nodule was hypermetabolic with an SUV of 4.9.  There was some adjacent activity with SUV of 7.2 but no discrete nodule in that area.  He underwent robotic bronchoscopy in May.  Inflammatory cells were noted but no malignancy.   A follow-up CT was done in August.  It showed progression of the anterior nodule to 18 mm.  There was a more subsolid appearing nodule in the other area of activity on the previous PET.   He underwent a repeat robotic bronchoscopy.  Biopsies of the primary  nodule were again nondiagnostic but biopsies of the more subsolid lesion showed squamous cell carcinoma.   He smoked about a pack of cigarettes daily for 45 years before quitting in 2023.  He has continued to use a vape device.  Denies any shortness of breath, cough, or wheezing.  Does not require inhalers.  No change in appetite but has lost a little weight which she attributes to diabetes.  Had coronary bypass grafting at [facility] in 2008.  Followed by Dr. Mady.  No chest pain, pressure, tightness.  Hospital Course:  Mr. Consalvo was admitted to the hospital for elective surgery on 10/27/2023.  He was taken to the operating room where robotic assisted left upper lobectomy was carried out without complication.  Intercostal nerve block was also accomplished using Exparel  along with mediastinal lymph node sampling.  Following procedure, he was awakened, extubated, and transferred to the postanesthesia care unit in stable condition.  Consults: {consultation:18241}  Significant Diagnostic Studies: {diagnostics:18242}  Treatments: {Tx:18249}  Discharge Exam: Blood pressure (!) 142/88, pulse 75, temperature 97.7 F (36.5 C), temperature source Oral, resp. rate 17, height 6' (1.829 m), weight 73.9 kg, SpO2 98%. {physical zkjf:6958869}  Disposition:    Allergies as of 10/27/2023   No Known Allergies   Med Rec must be completed prior to using this [facility]***       Follow-up Information     Kerrin Elspeth BROCKS, MD. Go to.   Specialty: Cardiothoracic Surgery Why: Your appointment is *** Please arrive about 45 minutes early for chest x-ray to be performed prior to the appointment on the second floor of the same building. Contact information: [address] [place] [address] [phone]  Signed:

## 2023-10-27 NOTE — Anesthesia Procedure Notes (Addendum)
 Procedure Name: Intubation Date/Time: 10/27/2023 11:51 AM  Performed by: Arvell Edsel HERO, CRNAPre-anesthesia Checklist: Patient identified, Emergency Drugs available, Suction available, Patient being monitored and Timeout performed Patient Re-evaluated:Patient Re-evaluated prior to induction Oxygen Delivery Method: Circle system utilized Preoxygenation: Pre-oxygenation with 100% oxygen Induction Type: IV induction Ventilation: Mask ventilation without difficulty Laryngoscope Size: Mac and 4 Grade View: Grade I Endobronchial tube: Left, Double lumen EBT, EBT position confirmed by auscultation and EBT position confirmed by fiberoptic bronchoscope and 39 Fr Number of attempts: 1 Placement Confirmation: ETT inserted through vocal cords under direct vision, positive ETCO2 and breath sounds checked- equal and bilateral Tube secured with: Tape Dental Injury: Teeth and Oropharynx as per pre-operative assessment  Comments: Viva-site tube utilized

## 2023-10-27 NOTE — Op Note (Signed)
 NAMESPARROW, SANZO MEDICAL RECORD NO: [id number] ACCOUNT NO: [id number] DATE OF BIRTH: 06/23/1955 FACILITY: [facility] LOCATION: [facility] PHYSICIAN: Elspeth BROCKS. Kerrin, MD  Operative Report   DATE OF PROCEDURE: 10/27/2023  PREOPERATIVE DIAGNOSIS: Squamous cell carcinoma, left upper lobe, clinical stage IA (T1, N0).  POSTOPERATIVE DIAGNOSIS: Squamous cell carcinoma, left upper lobe, clinical stage IA (T1, N0).  PROCEDURE:  Xi robotic-assisted left upper lobectomy,  Lymph node dissection, and  Intercostal nerve blocks levels 3 through 10.  SURGEON: Elspeth BROCKS. Kerrin, MD  ASSISTANT: Laurel Becket, PA.  Experienced assistance was necessary for this case due to surgical complexity. Laurel Becket assisted with port placement, camera management, robot docking and undocking, instrument exchange, specimen retrieval, suctioning, and wound closure.  ANESTHESIA: General.  FINDINGS: Significant adhesions to mammary artery, relatively complete fissure, severe emphysematous changes, benign-appearing lymph nodes. No air leak post-lobectomy.  CLINICAL NOTE: Mr. Mairena is a 68 year old man with a history of tobacco abuse and COPD. He also has a history of previous coronary bypass grafting. He was followed for a 12 mm nodule in the anterior left upper lobe that was hypermetabolic. Adjacent to that nodule, there was activity with an SUV of 7.2, but no discrete nodule in that area. He underwent robotic bronchoscopy, but there was no malignancy noted. On a follow-up CT in August, the anterior nodule had increased in size and the area that previously showed activity had a subsolid-appearing nodule. Repeat bronchoscopy was performed and biopsy of the primary nodule was nondiagnostic. Biopsy of the subsolid lesion showed squamous cell carcinoma. He was advised to undergo left upper lobectomy to remove both sites. The indications, risks, benefits, and alternatives were discussed in detail with the  patient. He understood and accepted the risks and agreed to proceed.  OPERATIVE NOTE: Mr. Chaires was brought to the operating room on 10/27/2023. He had induction of general anesthesia and was intubated. Intravenous antibiotics were administered. A Foley catheter was placed. Sequential compression devices were placed on the calves for DVT prophylaxis. He was placed in a right lateral decubitus position. A Bair Hugger was placed for active warming. The left chest was prepped and draped in the usual sterile fashion. Single-lung ventilation of the right lung was  initiated and was tolerated well throughout the procedure.  A time-out was performed. A solution containing 20 mL of liposome bupivacaine , 30 mL of 0.5% bupivacaine , and 50 mL of saline was prepared. This solution was used for local at the incisions as well as for the intercostal nerve blocks. An incision was made in the eighth intercostal space in the mid-axillary line and an 8 mm robotic port was inserted. After confirming intrapleural placement, carbon dioxide was insufflated per protocol. A 12-mm robotic port was placed in the eighth interspace anterior to the camera port. Intercostal nerve blocks were then performed from the third to the tenth interspace by injecting 10 mL of the bupivacaine  solution into a subpleural plane at each level. A 12-mm AirSeal port was placed in the 10th interspace  posterolaterally and two additional 8th interspace robotic ports were placed. The robot was deployed. The camera arm was docked. Targeting was performed. The remaining arms were docked. The robotic instruments were inserted with thoracoscopic visualization.  It was immediately apparent that there were adhesions of the left upper lobe to the lateral chest wall and mediastinum. Dissection was begun inferiorly. The lower lobe was retracted superiorly and the inferior pulmonary ligament was divided with bipolar cautery. All dissection was done with bipolar  cautery.  The level 9 node was removed and there was a level 8 node adjacent to that. All the lymph nodes appeared grossly benign. The lung then was retracted anteriorly and the pleural reflection was divided  at the hilum working superiorly to the left upper lobe bronchus. Level 10 and level 7 nodes were removed. The initial dissection on the pulmonary artery was begun at this site. The lung then was retracted and attention was turned to the adhesions. The adhesions to the lateral chest wall were thin and filmy. They were taken down without difficulty. Anteriorly, there were more dense adhesions in the area of the mammary artery. These were carefully taken down. There was some bleeding with this. Cautery was used sparingly until the mammary artery was positively identified and then no cautery was used in its vicinity, but other sites were cauterized as needed. Ultimately, the lobe was freed up. The lobe then was retracted inferiorly and the pleural reflection was divided to the hilum superiorly, level 10 and 5 nodes were removed. Working anteriorly, there was a large amount of fatty tissue in the hilum, which bled easily. This fatty tissue was dissected off. The fissure then was inspected and the basilar segmental branch of the lower lobe was easily visible with a complete fissure over the majority of its length. The pleura overlying that was incised and the plane was developed. The anterior portion of the fissure, which was a small area that was incomplete, was stapled using the robotic stapler. A level 11 node was removed at the bifurcation of the bronchi. Working posteriorly, the fissure was completed using bipolar cautery dissection and the stapler. There was a large trunk with the lingular branches and likely the posterior ascending branch was part of that as well. Surrounding nodes were removed.  The branch was encircled and divided with the robotic stapler. Next, the superior pulmonary vein was encircled and  divided with the robotic stapler and then a large level 10 node was removed. The anterior PA branch then was encircled and divided with the stapler and finally the more posterior apical branch was divided as well. The stapler then was placed across the origin of the left upper lobe bronchus and closed. The test inflation showed good aeration of the lower lobe. The stapler was fired transecting the left upper lobe bronchus. The chest was copiously irrigated with saline. A test inflation to 30 cm of water  pressure revealed no leakage from the bronchial stump or the lower lobe. On inspection, though, there was a small tear in the pleura of the lower lobe, which was stapled using the robotic stapler. The sponges and vessel loop used during the dissection were removed. Final inspection was made for  hemostasis. There was some oozing from the ports. After achieving adequate hemostasis, the robotic instruments were removed. The robot was undocked. The anterior eighth interspace incision was lengthened to 3 cm. A 15-mm endoscopic retrieval bag was placed into the chest. The left upper lobe was placed into the bag, removed, and sent for frozen section of the bronchial margin, which subsequently returned with no tumor seen.  Final inspection was made of all the port sites and staple lines. Once hemostasis was satisfactory, a 28-French Blake drain was placed through the original port incision and directed to the apex. It was secured with a #1 silk suture. Dual lung ventilation was resumed. The remaining incisions were closed in standard fashion. All sponge, needle, and instrument counts were correct at the end of the procedure.  The patient was placed back in the supine position. The chest tube was placed to a Pleur-evac on waterseal. The patient then was extubated in the operating room and taken to the post-anesthetic care unit in good condition.    SUJ D: 10/27/2023 4:27:39 pm T: 10/27/2023 10:39:00 pm  JOB:  [id number]/ [id number]

## 2023-10-27 NOTE — Transfer of Care (Signed)
 Immediate Anesthesia Transfer of Care Note  Patient: Maxwell Young  Procedure(s) Performed: LOBECTOMY, LUNG, LEFT UPPER ROBOT-ASSISTED, USING VATS (Left: Chest) BLOCK, NERVE, INTERCOSTAL LYMPH NODE BIOPSY (Left)  Patient Location: PACU  Anesthesia Type:General  Level of Consciousness: awake, alert , oriented, and patient cooperative  Airway & Oxygen Therapy: Patient Spontanous Breathing and Patient connected to face mask oxygen  Post-op Assessment: Report given to RN, Post -op Vital signs reviewed and stable, Patient moving all extremities, and Patient moving all extremities X 4  Post vital signs: Reviewed and stable  Last Vitals:  Vitals Value Taken Time  BP 115/80 10/27/23 15:15  Temp    Pulse 72 10/27/23 15:19  Resp 24 10/27/23 15:19  SpO2 100 % 10/27/23 15:19  Vitals shown include unfiled device data.  Last Pain:  Vitals:   10/27/23 1026  TempSrc:   PainSc: 0-No pain         Complications: No notable events documented.

## 2023-10-27 NOTE — Anesthesia Postprocedure Evaluation (Signed)
 Anesthesia Post Note  Patient: Corporate investment banker  Procedure(s) Performed: LOBECTOMY, LUNG, LEFT UPPER ROBOT-ASSISTED, USING VATS (Left: Chest) BLOCK, NERVE, INTERCOSTAL LYMPH NODE BIOPSY (Left)     Patient location during evaluation: PACU Anesthesia Type: General Level of consciousness: awake Pain management: pain level controlled Vital Signs Assessment: post-procedure vital signs reviewed and stable Respiratory status: spontaneous breathing Cardiovascular status: blood pressure returned to baseline Postop Assessment: no apparent nausea or vomiting Anesthetic complications: no   No notable events documented.  Last Vitals:  Vitals:   10/27/23 1715 10/27/23 1730  BP: (!) 111/99 131/84  Pulse: 75 75  Resp: 17 14  Temp:    SpO2: 97% 97%    Last Pain:  Vitals:   10/27/23 1730  TempSrc:   PainSc: 1                  Lauraine KATHEE [place]

## 2023-10-28 ENCOUNTER — Encounter (HOSPITAL_COMMUNITY): Payer: Self-pay | Admitting: Thoracic Surgery (Cardiothoracic Vascular Surgery)

## 2023-10-28 ENCOUNTER — Other Ambulatory Visit: Payer: Self-pay

## 2023-10-28 ENCOUNTER — Inpatient Hospital Stay (HOSPITAL_COMMUNITY)

## 2023-10-28 LAB — CBC
HCT: 37.9 % — ABNORMAL LOW (ref 39.0–52.0)
Hemoglobin: 12.9 g/dL — ABNORMAL LOW (ref 13.0–17.0)
MCH: 31.1 pg (ref 26.0–34.0)
MCHC: 34 g/dL (ref 30.0–36.0)
MCV: 91.3 fL (ref 80.0–100.0)
Platelets: 145 K/uL — ABNORMAL LOW (ref 150–400)
RBC: 4.15 MIL/uL — ABNORMAL LOW (ref 4.22–5.81)
RDW: 13.2 % (ref 11.5–15.5)
WBC: 8 K/uL (ref 4.0–10.5)
nRBC: 0 % (ref 0.0–0.2)

## 2023-10-28 LAB — FUNGUS CULTURE WITH STAIN

## 2023-10-28 LAB — BASIC METABOLIC PANEL WITH GFR
Anion gap: 7 (ref 5–15)
BUN: 11 mg/dL (ref 8–23)
CO2: 25 mmol/L (ref 22–32)
Calcium: 8.3 mg/dL — ABNORMAL LOW (ref 8.9–10.3)
Chloride: 108 mmol/L (ref 98–111)
Creatinine, Ser: 0.93 mg/dL (ref 0.61–1.24)
GFR, Estimated: >60 mL/min (ref >60)
Glucose, Bld: 112 mg/dL — ABNORMAL HIGH (ref 70–99)
Potassium: 3.8 mmol/L (ref 3.5–5.1)
Sodium: 140 mmol/L (ref 135–145)

## 2023-10-28 LAB — GLUCOSE, CAPILLARY
Glucose-Capillary: 179 mg/dL — ABNORMAL HIGH (ref 70–99)
Glucose-Capillary: 133 mg/dL — ABNORMAL HIGH (ref 70–99)
Glucose-Capillary: 164 mg/dL — ABNORMAL HIGH (ref 70–99)
Glucose-Capillary: 118 mg/dL — ABNORMAL HIGH (ref 70–99)

## 2023-10-28 LAB — FUNGUS CULTURE RESULT

## 2023-10-28 LAB — FUNGAL ORGANISM REFLEX

## 2023-10-28 MED ORDER — CHLORHEXIDINE GLUCONATE CLOTH 2 % EX PADS
6.0000 | MEDICATED_PAD | Freq: Once | CUTANEOUS | Status: AC
Start: 2023-10-28 — End: 2023-10-27
  Administered 2023-10-27: 6 via TOPICAL

## 2023-10-28 MED ORDER — METOPROLOL SUCCINATE ER 25 MG PO TB24
25.0000 mg | ORAL_TABLET | Freq: Every day | ORAL | Status: DC
Start: 2023-10-28 — End: 2023-10-31
  Administered 2023-10-29 – 2023-10-30 (×2): 25 mg via ORAL
  Filled 2023-10-28 (×3): qty 1

## 2023-10-28 MED ORDER — METFORMIN HCL 500 MG PO TABS
500.0000 mg | ORAL_TABLET | Freq: Two times a day (BID) | ORAL | Status: DC
  Administered 2023-10-28 – 2023-10-31 (×6): 500 mg via ORAL
  Filled 2023-10-28 (×6): qty 1

## 2023-10-28 NOTE — Plan of Care (Signed)
  Problem: Education: Goal: Knowledge of General Education information will improve Description: Including pain rating scale, medication(s)/side effects and non-pharmacologic comfort measures Outcome: Progressing   Problem: Health Behavior/Discharge Planning: Goal: Ability to manage health-related needs will improve Outcome: Progressing   Problem: Clinical Measurements: Goal: Ability to maintain clinical measurements within normal limits will improve Outcome: Progressing Goal: Diagnostic test results will improve Outcome: Progressing Goal: Respiratory complications will improve Outcome: Progressing   Problem: Nutrition: Goal: Adequate nutrition will be maintained Outcome: Progressing   Problem: Coping: Goal: Level of anxiety will decrease Outcome: Progressing   Problem: Pain Managment: Goal: General experience of comfort will improve and/or be controlled Outcome: Progressing   Problem: Safety: Goal: Ability to remain free from injury will improve Outcome: Progressing   Problem: Skin Integrity: Goal: Risk for impaired skin integrity will decrease Outcome: Progressing   Problem: Education: Goal: Knowledge of disease or condition will improve Outcome: Progressing Goal: Knowledge of the prescribed therapeutic regimen will improve Outcome: Progressing   Problem: Activity: Goal: Risk for activity intolerance will decrease Outcome: Progressing   Problem: Cardiac: Goal: Will achieve and/or maintain hemodynamic stability Outcome: Progressing   Problem: Clinical Measurements: Goal: Postoperative complications will be avoided or minimized Outcome: Progressing   Problem: Pain Management: Goal: Pain level will decrease Outcome: Progressing   Problem: Skin Integrity: Goal: Wound healing without signs and symptoms infection will improve Outcome: Progressing

## 2023-10-28 NOTE — TOC CM/SW Note (Signed)
 Transition of Care [facility]) - Inpatient Brief Assessment   Patient Details  Name: Maxwell Young MRN: [id number] Date of Birth: 1955/02/24  Transition of [facility]) CM/SW Contact:    Lauraine FORBES Saa, LCSWA Phone Number: 10/28/2023, 9:03 AM   Clinical Narrative:  9:03 AM Per chart review, patient resides at home with spouse. Patient has a PCP and insurance. Patient does not have SNF/HH/DME history. Patient's preferred pharmacy is [facility] [address]. No TOC needs identified at this time. TOC will continue to follow.  Transition of Care Asessment: Insurance and Status: Insurance coverage has been reviewed Patient has primary care physician: Yes Home environment has been reviewed: Private Residence Prior level of function:: N/A Prior/Current Home Services: No current home services Social Drivers of Health Review: SDOH reviewed no interventions necessary Readmission risk has been reviewed: Yes (Currently Green 10%) Transition of care needs: no transition of care needs at this time

## 2023-10-28 NOTE — Progress Notes (Addendum)
      [address] [place] [employer] [address]             [phone]     1 Day Post-Op Procedure(s) (LRB): LOBECTOMY, LUNG, LEFT UPPER ROBOT-ASSISTED, USING VATS (Left) BLOCK, NERVE, INTERCOSTAL LYMPH NODE BIOPSY (Left)  Subjective:  Patient is doing well.  He has already been up ambulating w/o difficulty.  Pain is well controlled.  Denies N/V.  Objective: Vital signs in last 24 hours: Temp:  [97.7 F (36.5 C)-98.2 F (36.8 C)] 98.2 F (36.8 C) (10/07 0452) Pulse Rate:  [62-75] 71 (10/06 1845) Cardiac Rhythm: Normal sinus rhythm;Bundle branch block (10/06 1907) Resp:  [13-19] 15 (10/07 0452) BP: (105-142)/(66-99) 105/66 (10/07 0452) SpO2:  [93 %-100 %] 98 % (10/07 0452) Arterial Line BP: (119-161)/(59-88) 161/79 (10/06 1715) Weight:  [73.9 kg] 73.9 kg (10/06 1021)  Intake/Output from previous day: 10/06 0701 - 10/07 0700 In: 2677.2 [I.V.:1977.2; IV Piggyback:700] Out: 3645 [Urine:2925; Blood:300; Chest Tube:420]  General appearance: alert, cooperative, and no distress Heart: regular rate and rhythm Lungs: clear to auscultation bilaterally Abdomen: soft, non-tender; bowel sounds normal; no masses,  no organomegaly Extremities: extremities normal, atraumatic, no cyanosis or edema Wound: clean and dry  Lab Results: Recent Labs    10/28/23 0332  WBC 8.0  HGB 12.9*  HCT 37.9*  PLT 145*   BMET:  Recent Labs    10/28/23 0332  NA 140  K 3.8  CL 108  CO2 25  GLUCOSE 112*  BUN 11  CREATININE 0.93  CALCIUM  8.3*    PT/INR: No results for input(s): LABPROT, INR in the last 72 hours. ABG    Component Value Date/Time   TCO2 23 01/07/2023 0645   CBG (last 3)  Recent Labs    10/27/23 1528 10/27/23 2112 10/28/23 0605  GLUCAP 164* 242* 179*    Assessment/Plan: S/P Procedure(s) (LRB): LOBECTOMY, LUNG, LEFT UPPER ROBOT-ASSISTED, USING VATS (Left) BLOCK, NERVE, INTERCOSTAL LYMPH NODE BIOPSY (Left)  CV- NSR, H/O HTN BP is elevated-  continue Norvasc , resume home Toprol  XL Pulm- Emphysema, CT on water  seal with air leak.. 450 cc bloody output.SABRA CXR with improvement of left pneumothorax, leave chest tube in place today Renal- creatinine remains normal at 0.93, tolerating oral diet, will d/c IV fluids, okay to continue Toradol DM- sugars are elevated at times, will resume home Metformin , continue SSIP will adjust regimen as sugar control allows Lovenox for DVT prophylaxis   LOS: 1 day    Rocky Shad, PA-C 10/28/2023 Patient seen and examined, agree with above  Elspeth C. Kerrin, MD [facility] [phone]

## 2023-10-28 NOTE — Progress Notes (Signed)
 Mobility Specialist Progress Note;   10/28/23 1057  Mobility  Activity Ambulated independently  Level of Assistance Standby assist, set-up cues, supervision of patient - no hands on  Assistive Device None  Distance Ambulated (ft) 1000 ft  Activity Response Tolerated well  Mobility Referral Yes  Mobility visit 1 Mobility  Mobility Specialist Start Time (ACUTE ONLY) 1057  Mobility Specialist Stop Time (ACUTE ONLY) 1115  Mobility Specialist Time Calculation (min) (ACUTE ONLY) 18 min   Pt eager for mobility. Required no physical assistance during ambulation, SV for safety. VSS on RA when able to receive accurate pleth reading. No c/o when asked. Pt returned back to chair and left with all needs met.   Lauraine Erm Mobility Specialist Please contact via SecureChat or [employer] [phone]

## 2023-10-29 ENCOUNTER — Inpatient Hospital Stay (HOSPITAL_COMMUNITY)

## 2023-10-29 LAB — COMPREHENSIVE METABOLIC PANEL WITH GFR
ALT: 11 U/L (ref 0–44)
AST: 19 U/L (ref 15–41)
Albumin: 3.1 g/dL — ABNORMAL LOW (ref 3.5–5.0)
Alkaline Phosphatase: 43 U/L (ref 38–126)
Anion gap: 10 (ref 5–15)
BUN: 20 mg/dL (ref 8–23)
CO2: 22 mmol/L (ref 22–32)
Calcium: 8.5 mg/dL — ABNORMAL LOW (ref 8.9–10.3)
Chloride: 105 mmol/L (ref 98–111)
Creatinine, Ser: 1.26 mg/dL — ABNORMAL HIGH (ref 0.61–1.24)
GFR, Estimated: >60 mL/min (ref >60)
Glucose, Bld: 139 mg/dL — ABNORMAL HIGH (ref 70–99)
Potassium: 3.8 mmol/L (ref 3.5–5.1)
Sodium: 137 mmol/L (ref 135–145)
Total Bilirubin: 0.6 mg/dL (ref 0.0–1.2)
Total Protein: 5.4 g/dL — ABNORMAL LOW (ref 6.5–8.1)

## 2023-10-29 LAB — CBC
HCT: 34.9 % — ABNORMAL LOW (ref 39.0–52.0)
Hemoglobin: 12 g/dL — ABNORMAL LOW (ref 13.0–17.0)
MCH: 31.1 pg (ref 26.0–34.0)
MCHC: 34.4 g/dL (ref 30.0–36.0)
MCV: 90.4 fL (ref 80.0–100.0)
Platelets: 149 K/uL — ABNORMAL LOW (ref 150–400)
RBC: 3.86 MIL/uL — ABNORMAL LOW (ref 4.22–5.81)
RDW: 12.9 % (ref 11.5–15.5)
WBC: 8.6 K/uL (ref 4.0–10.5)
nRBC: 0 % (ref 0.0–0.2)

## 2023-10-29 LAB — GLUCOSE, CAPILLARY
Glucose-Capillary: 142 mg/dL — ABNORMAL HIGH (ref 70–99)
Glucose-Capillary: 142 mg/dL — ABNORMAL HIGH (ref 70–99)
Glucose-Capillary: 121 mg/dL — ABNORMAL HIGH (ref 70–99)
Glucose-Capillary: 123 mg/dL — ABNORMAL HIGH (ref 70–99)

## 2023-10-29 LAB — SURGICAL PATHOLOGY

## 2023-10-29 NOTE — Progress Notes (Addendum)
      [address] [place] [employer] [address]             [phone]         2 Days Post-Op Procedure(s) (LRB): LOBECTOMY, LUNG, LEFT UPPER ROBOT-ASSISTED, USING VATS (Left) BLOCK, NERVE, INTERCOSTAL LYMPH NODE BIOPSY (Left)  Subjective:  Patient sitting up on side of bed eating breakfast.  He is without complaints this morning.  Pain remains well controlled.  Ambulating without difficulty.  Objective: Vital signs in last 24 hours: Temp:  [97.6 F (36.4 C)-98.1 F (36.7 C)] 97.8 F (36.6 C) (10/08 0623) Pulse Rate:  [56-90] 66 (10/08 0623) Cardiac Rhythm: Normal sinus rhythm (10/08 0700) Resp:  [15-20] 15 (10/08 0623) BP: (90-124)/(61-86) 120/73 (10/08 0623) SpO2:  [93 %-99 %] 93 % (10/08 0623)  Intake/Output from previous day: 10/07 0701 - 10/08 0700 In: 476 [P.O.:476] Out: 260 [Chest Tube:260]  General appearance: alert, cooperative, and no distress Heart: regular rate and rhythm Lungs: clear to auscultation bilaterally Abdomen: soft, non-tender; bowel sounds normal; no masses,  no organomegaly Extremities: extremities normal, atraumatic, no cyanosis or edema Wound: clean and dry  Lab Results: Recent Labs    10/28/23 0332 10/29/23 0252  WBC 8.0 8.6  HGB 12.9* 12.0*  HCT 37.9* 34.9*  PLT 145* 149*   BMET:  Recent Labs    10/28/23 0332 10/29/23 0252  NA 140 137  K 3.8 3.8  CL 108 105  CO2 25 22  GLUCOSE 112* 139*  BUN 11 20  CREATININE 0.93 1.26*  CALCIUM  8.3* 8.5*    PT/INR: No results for input(s): LABPROT, INR in the last 72 hours. ABG    Component Value Date/Time   TCO2 23 01/07/2023 0645   CBG (last 3)  Recent Labs    10/28/23 1609 10/28/23 2052 10/29/23 0627  GLUCAP 164* 118* 142*    Assessment/Plan: S/P Procedure(s) (LRB): LOBECTOMY, LUNG, LEFT UPPER ROBOT-ASSISTED, USING VATS (Left) BLOCK, NERVE, INTERCOSTAL LYMPH NODE BIOPSY (Left)  CV- NSR, H/O Essential HTN- continue Norvasc , Toprol  XL at reduced  dose, BP currently well controlled Pulm- CT on water  seal, 350 cc of bloody drainage yesterday, air leak initial cough, but resolved on subsequent.SABRA leave chest tube in place today due to output.. CXR shows resolution of previous pneumothorax Renal- creatinine bumped to 1.26, likely due to use of Toradol, will discontinue, recheck BMET in AM DM- blood sugars have improved, continue SSIP and Metformin  Continue Lovenox for DVT prophylaxis   LOS: 2 days    Rocky Shad, PA-C 10/29/2023 7:20 AM  Patient seen and examined, agree with above Minimal air leak, moderate drainage- keep tube in today CXR looks good Ambulate, SCD + enoxaparin  Kylan Veach C. Kerrin, MD [facility] [phone]

## 2023-10-29 NOTE — Plan of Care (Signed)
  Problem: Education: Goal: Knowledge of General Education information will improve Description: Including pain rating scale, medication(s)/side effects and non-pharmacologic comfort measures Outcome: Progressing   Problem: Health Behavior/Discharge Planning: Goal: Ability to manage health-related needs will improve Outcome: Progressing   Problem: Clinical Measurements: Goal: Diagnostic test results will improve Outcome: Progressing Goal: Respiratory complications will improve Outcome: Progressing Goal: Cardiovascular complication will be avoided Outcome: Progressing   Problem: Nutrition: Goal: Adequate nutrition will be maintained Outcome: Progressing   Problem: Elimination: Goal: Will not experience complications related to bowel motility Outcome: Progressing Goal: Will not experience complications related to urinary retention Outcome: Progressing   Problem: Pain Managment: Goal: General experience of comfort will improve and/or be controlled Outcome: Progressing

## 2023-10-30 ENCOUNTER — Inpatient Hospital Stay (HOSPITAL_COMMUNITY)

## 2023-10-30 LAB — GLUCOSE, CAPILLARY
Glucose-Capillary: 143 mg/dL — ABNORMAL HIGH (ref 70–99)
Glucose-Capillary: 114 mg/dL — ABNORMAL HIGH (ref 70–99)
Glucose-Capillary: 181 mg/dL — ABNORMAL HIGH (ref 70–99)
Glucose-Capillary: 98 mg/dL (ref 70–99)

## 2023-10-30 LAB — BASIC METABOLIC PANEL WITH GFR
Anion gap: 13 (ref 5–15)
BUN: 20 mg/dL (ref 8–23)
CO2: 20 mmol/L — ABNORMAL LOW (ref 22–32)
Calcium: 8.7 mg/dL — ABNORMAL LOW (ref 8.9–10.3)
Chloride: 105 mmol/L (ref 98–111)
Creatinine, Ser: 1.12 mg/dL (ref 0.61–1.24)
GFR, Estimated: >60 mL/min (ref >60)
Glucose, Bld: 117 mg/dL — ABNORMAL HIGH (ref 70–99)
Potassium: 3.8 mmol/L (ref 3.5–5.1)
Sodium: 138 mmol/L (ref 135–145)

## 2023-10-30 NOTE — Plan of Care (Signed)
  Problem: Education: Goal: Knowledge of General Education information will improve Description: Including pain rating scale, medication(s)/side effects and non-pharmacologic comfort measures Outcome: Progressing   Problem: Health Behavior/Discharge Planning: Goal: Ability to manage health-related needs will improve Outcome: Progressing   Problem: Clinical Measurements: Goal: Ability to maintain clinical measurements within normal limits will improve Outcome: Progressing Goal: Will remain free from infection Outcome: Progressing Goal: Diagnostic test results will improve Outcome: Progressing Goal: Respiratory complications will improve Outcome: Progressing Goal: Cardiovascular complication will be avoided Outcome: Progressing   Problem: Nutrition: Goal: Adequate nutrition will be maintained Outcome: Progressing   Problem: Elimination: Goal: Will not experience complications related to bowel motility Outcome: Progressing Goal: Will not experience complications related to urinary retention Outcome: Progressing   Problem: Pain Managment: Goal: General experience of comfort will improve and/or be controlled Outcome: Progressing   Problem: Safety: Goal: Ability to remain free from injury will improve Outcome: Progressing   Problem: Education: Goal: Knowledge of disease or condition will improve Outcome: Progressing Goal: Knowledge of the prescribed therapeutic regimen will improve Outcome: Progressing   Problem: Respiratory: Goal: Respiratory status will improve Outcome: Progressing   Problem: Skin Integrity: Goal: Wound healing without signs and symptoms infection will improve Outcome: Progressing

## 2023-10-30 NOTE — Progress Notes (Signed)
 14:00 Chest tube clamp trial: The left pleral tube was clamped at ~8am today.  CXR at 1pm shows no significant change in the left apical PTX.  There was no evidence of air leak when the clamp was removed.  Will d/c the chest tube this afternoon.   EMERSON Becket, PA-C

## 2023-10-30 NOTE — Progress Notes (Addendum)
      [address] [place] [employer] [address]             [phone]         3 Days Post-Op Procedure(s) (LRB): LOBECTOMY, LUNG, LEFT UPPER ROBOT-ASSISTED, USING VATS (Left) BLOCK, NERVE, INTERCOSTAL LYMPH NODE BIOPSY (Left)  Subjective:  Patient sitting up in bed without complaints.  Ambulating without difficulty.    Objective: Vital signs in last 24 hours: Temp:  [97.6 F (36.4 C)-98.5 F (36.9 C)] 98.4 F (36.9 C) (10/09 0548) Pulse Rate:  [62-96] 96 (10/09 0548) Cardiac Rhythm: Sinus tachycardia (10/09 0716) Resp:  [18-24] 18 (10/09 0548) BP: (103-143)/(70-92) 128/79 (10/09 0548) SpO2:  [93 %-97 %] 93 % (10/09 0548)  Intake/Output from previous day: 10/08 0701 - 10/09 0700 In: 320 [P.O.:320] Out: 270 [Chest Tube:270]  General appearance: alert, cooperative, and no distress Heart: regular rate and rhythm Lungs: clear to auscultation bilaterally Abdomen: soft, non-tender; bowel sounds normal; no masses,  no organomegaly Extremities: extremities normal, atraumatic, no cyanosis or edema Wound: clean and dry  Lab Results: Recent Labs    10/28/23 0332 10/29/23 0252  WBC 8.0 8.6  HGB 12.9* 12.0*  HCT 37.9* 34.9*  PLT 145* 149*   BMET:  Recent Labs    10/29/23 0252 10/30/23 0203  NA 137 138  K 3.8 3.8  CL 105 105  CO2 22 20*  GLUCOSE 139* 117*  BUN 20 20  CREATININE 1.26* 1.12  CALCIUM  8.5* 8.7*    PT/INR: No results for input(s): LABPROT, INR in the last 72 hours. ABG    Component Value Date/Time   TCO2 23 01/07/2023 0645   CBG (last 3)  Recent Labs    10/29/23 1635 10/29/23 2130 10/30/23 0550  GLUCAP 121* 123* 143*    Assessment/Plan: S/P Procedure(s) (LRB): LOBECTOMY, LUNG, LEFT UPPER ROBOT-ASSISTED, USING VATS (Left) BLOCK, NERVE, INTERCOSTAL LYMPH NODE BIOPSY (Left)  CV- NSR, H/O Essential HTN- continue Norvasc , Toprol  XL Pulm- CT on water  seal, 340 cc bloody output yesterday, I did not see a definitive air  leak.. will defer chest tube management to Dr. Kerrin Renal- creatinine improved down to 1.12 DM- sugars well controlled... continue Metformin , SSIP Dispo- patient stable, no definitive air leak present, output remains high at 340 cc.. will defer to Dr. Kerrin for chest tube management   LOS: 3 days    Rocky Shad, PA-C 10/30/2023 7:53 AM Patient seen and examined, agree with findings noted above CXR stable.  Has a lot of tidal variation with cough.  Intermittently noted very small bubble.  Will clamp tube and recheck a CXR at 1 PM- if no issues dc tube later today Path T3,N0 squamous cell carcinoma, stage IIB Will arrange Oncology consultation as outpatient  Elspeth C. Kerrin, MD [facility] [phone]

## 2023-10-30 NOTE — Care Management Important Message (Signed)
 Important Message  Patient Details  Name: RODERT HINCH MRN: [id number] Date of Birth: 11/18/1955   Important Message Given:  Yes - Medicare IM     Claretta Deed 10/30/2023, 1:59 PM

## 2023-10-31 ENCOUNTER — Inpatient Hospital Stay (HOSPITAL_COMMUNITY)

## 2023-10-31 ENCOUNTER — Other Ambulatory Visit (HOSPITAL_COMMUNITY): Payer: Self-pay

## 2023-10-31 DIAGNOSIS — Z902 Acquired absence of lung [part of]: Secondary | ICD-10-CM

## 2023-10-31 LAB — GLUCOSE, CAPILLARY: Glucose-Capillary: 144 mg/dL — ABNORMAL HIGH (ref 70–99)

## 2023-10-31 MED ORDER — GABAPENTIN 300 MG PO CAPS
300.0000 mg | ORAL_CAPSULE | Freq: Two times a day (BID) | ORAL | 1 refills | Status: AC
  Filled 2023-10-31: qty 60, 30d supply, fill #0

## 2023-10-31 MED ORDER — ORAL CARE MOUTH RINSE: 15.0000 mL | OROMUCOSAL | Status: DC | PRN

## 2023-10-31 MED ORDER — ACETAMINOPHEN 500 MG PO TABS: 1000.0000 mg | ORAL_TABLET | Freq: Four times a day (QID) | ORAL | Status: AC | PRN

## 2023-10-31 MED ORDER — METOPROLOL SUCCINATE ER 100 MG PO TB24
100.0000 mg | ORAL_TABLET | Freq: Every day | ORAL | Status: DC
  Administered 2023-10-31: 100 mg via ORAL
  Filled 2023-10-31: qty 1

## 2023-10-31 MED ORDER — TRAMADOL HCL 50 MG PO TABS
50.0000 mg | ORAL_TABLET | Freq: Four times a day (QID) | ORAL | 0 refills | Status: AC | PRN
  Filled 2023-10-31: qty 30, 8d supply, fill #0

## 2023-10-31 NOTE — Plan of Care (Signed)
  Problem: Education: Goal: Knowledge of General Education information will improve Description: Including pain rating scale, medication(s)/side effects and non-pharmacologic comfort measures Outcome: Progressing   Problem: Clinical Measurements: Goal: Cardiovascular complication will be avoided Outcome: Progressing   Problem: Activity: Goal: Risk for activity intolerance will decrease Outcome: Progressing   Problem: Pain Managment: Goal: General experience of comfort will improve and/or be controlled Outcome: Progressing   Problem: Clinical Measurements: Goal: Postoperative complications will be avoided or minimized Outcome: Progressing   Problem: Skin Integrity: Goal: Wound healing without signs and symptoms infection will improve Outcome: Progressing

## 2023-10-31 NOTE — Progress Notes (Addendum)
      [address] [place] [address]             [phone]         4 Days Post-Op Procedure(s) (LRB): LOBECTOMY, LUNG, LEFT UPPER ROBOT-ASSISTED, USING VATS (Left) BLOCK, NERVE, INTERCOSTAL LYMPH NODE BIOPSY (Left)  Subjective:  Patient sitting up eating breakfast.  Wants to go home.  Denies chest pain/shortness of breath.  + ambulation   + BM  Objective: Vital signs in last 24 hours: Temp:  [97.5 F (36.4 C)-98.5 F (36.9 C)] 97.5 F (36.4 C) (10/10 0633) Pulse Rate:  [93-106] 100 (10/10 9366) Cardiac Rhythm: Normal sinus rhythm (10/10 0726) Resp:  [18-23] 19 (10/10 0633) BP: (106-151)/(68-93) 126/76 (10/10 0633) SpO2:  [95 %-98 %] 95 % (10/10 0633)  Intake/Output from previous day: 10/09 0701 - 10/10 0700 In: 360 [P.O.:360] Out: 60 [Chest Tube:60]  General appearance: alert, cooperative, and no distress Heart: regular rate and rhythm Lungs: clear to auscultation bilaterally Abdomen: soft, non-tender; bowel sounds normal; no masses,  no organomegaly Extremities: extremities normal, atraumatic, no cyanosis or edema Wound: clean and dry  Lab Results: Recent Labs    10/29/23 0252  WBC 8.6  HGB 12.0*  HCT 34.9*  PLT 149*   BMET:  Recent Labs    10/29/23 0252 10/30/23 0203  NA 137 138  K 3.8 3.8  CL 105 105  CO2 22 20*  GLUCOSE 139* 117*  BUN 20 20  CREATININE 1.26* 1.12  CALCIUM  8.5* 8.7*    PT/INR: No results for input(s): LABPROT, INR in the last 72 hours. ABG    Component Value Date/Time   TCO2 23 01/07/2023 0645   CBG (last 3)  Recent Labs    10/30/23 1541 10/30/23 2123 10/31/23 0630  GLUCAP 181* 98 144*    Assessment/Plan: S/P Procedure(s) (LRB): LOBECTOMY, LUNG, LEFT UPPER ROBOT-ASSISTED, USING VATS (Left) BLOCK, NERVE, INTERCOSTAL LYMPH NODE BIOPSY (Left)  CV- Sinus Tach- increase Toprol  XL to home regimen, continue Norvasc  for essential HTN Pulm- CT removed yesterday, CXR with small  apical/lateral pneumothorax.. patient is asymptomatic DM- sugars remain well controlled Dispo- patient stable, small apical/lateral space post chest tube removal.. patient is asymptomatic, will plan to d/c home today   LOS: 4 days    Rocky Shad, PA-C 10/31/2023 7:35 AM  Patient seen and examined, looks great, agree with above Home today  Elspeth C. Kerrin, MD [facility] [phone]

## 2023-10-31 NOTE — Progress Notes (Signed)
 Went over AVS with patient/family member, removed Ivs, patient belongings gathered, TOC meds given and patient was wheeled out to family vehicle.

## 2023-11-06 ENCOUNTER — Other Ambulatory Visit: Payer: Self-pay

## 2023-11-06 NOTE — Progress Notes (Signed)
 The proposed treatment discussed in conference is for discussion purpose only and is not a binding recommendation.  The patients have not been physically examined, or presented with their treatment options.  Therefore, final treatment plans cannot be decided.

## 2023-11-07 ENCOUNTER — Other Ambulatory Visit: Payer: Self-pay | Admitting: Thoracic Surgery (Cardiothoracic Vascular Surgery)

## 2023-11-07 DIAGNOSIS — R911 Solitary pulmonary nodule: Secondary | ICD-10-CM

## 2023-11-11 ENCOUNTER — Ambulatory Visit (INDEPENDENT_AMBULATORY_CARE_PROVIDER_SITE_OTHER): Payer: Self-pay | Admitting: Thoracic Surgery (Cardiothoracic Vascular Surgery)

## 2023-11-11 ENCOUNTER — Encounter: Payer: Self-pay | Admitting: Thoracic Surgery (Cardiothoracic Vascular Surgery)

## 2023-11-11 ENCOUNTER — Ambulatory Visit (HOSPITAL_COMMUNITY)
Admission: RE | Admit: 2023-11-11 | Discharge: 2023-11-11 | Disposition: A | Discharge status: Home / Self Care | Source: Ambulatory Visit | Attending: Cardiology | Admitting: Cardiology

## 2023-11-11 VITALS — BP 96/57 | HR 70 | Resp 20 | Ht 72.0 in | Wt 166.2 lb

## 2023-11-11 DIAGNOSIS — R911 Solitary pulmonary nodule: Secondary | ICD-10-CM | POA: Insufficient documentation

## 2023-11-11 DIAGNOSIS — C3492 Malignant neoplasm of unspecified part of left bronchus or lung: Secondary | ICD-10-CM

## 2023-11-11 DIAGNOSIS — Z09 Encounter for follow-up examination after completed treatment for conditions other than malignant neoplasm: Secondary | ICD-10-CM

## 2023-11-11 DIAGNOSIS — Z902 Acquired absence of lung [part of]: Secondary | ICD-10-CM

## 2023-11-11 NOTE — Progress Notes (Signed)
 [address] [place] [address]             [phone]      HPI: Mr. Mcmannis returns for scheduled follow-up after recent left upper lobectomy.  [place] is a 68 year old man with a past history significant for tobacco use, emphysema, CAD, CABG, diastolic dysfunction, aortic atherosclerosis, type 2 diabetes, hypertension, hyperlipidemia, PAD, BPH, degenerative disc disease, cholelithiasis, nephrolithiasis, and lung cancer.  He was followed in the low-dose CT screening program for lung cancer due to smoking history.  In February 2025 he had a new 12.8 mm anterior left upper lobe nodule.  It was hypermetabolic on PET.  There also was adjacent hypermetabolic activity but no discrete nodule.  Robotic bronchoscopy showed only inflammatory cells.  A follow-up CT in August showed progression of the nodule and there was a subsolid lesion in the other area of activity.  Repeat robotic bronchoscopy revealed squamous cell carcinoma in the less well-defined area.  He underwent robotic assisted left upper lobectomy on 10/27/2023.  Technically difficult due to his previous CABG and left mammary graft.  He did well postoperatively and went home on day 4.  Pathology showed 2 separate squamous cell carcinomas.  Pathologic stage was 2B (T3, N0).  He feels well.  Has some soreness but is not taking any narcotics.  Is only taking gabapentin  once a day.  Has been walking on a regular basis.  Has not had any significant respiratory issues.  Patient Active Problem List   Diagnosis Date Noted   Status post robot-assisted surgical procedure 10/27/2023   S/P Robotic Assisted Left Video Thoracoscopy with Left Upper Lobectomy, Lymph Node Dissection, and Intercostal Nerve Block 10/27/2023   Squamous cell carcinoma of left lung (HCC) 10/02/2023   Nodule of left lung 05/26/2023   Non-recurrent bilateral inguinal hernia without obstruction or gangrene 01/07/2023   Abnormal EKG  11/28/2022   Ectatic abdominal aorta 09/24/2022   Advance care planning 09/12/2022   Polyp of transverse colon    Benign prostatic hyperplasia with lower urinary tract symptoms 08/31/2019   Type 2 diabetes mellitus with other specified complication (HCC) 09/07/2018   COPD (chronic obstructive pulmonary disease) with emphysema (HCC) 08/26/2017   Aortic atherosclerosis 02/24/2017   PAD (peripheral artery disease) 02/24/2017   Personal history of tobacco use, presenting hazards to health 07/27/2015   History of colonic polyps    Benign neoplasm of transverse colon    Rectal polyp    Smoking greater than 40 pack years 06/13/2015   Essential hypertension 12/14/2014   CAD (coronary artery disease) 12/14/2014   GERD (gastroesophageal reflux disease) 12/14/2014   Hyperlipidemia associated with type 2 diabetes mellitus (HCC) 12/14/2014     Current Outpatient Medications  Medication Sig Dispense Refill   acetaminophen  (TYLENOL ) 500 MG tablet Take 2 tablets (1,000 mg total) by mouth every 6 (six) hours as needed.     amLODipine  (NORVASC ) 10 MG tablet Take 1 tablet (10 mg total) by mouth daily. 90 tablet 1   aspirin EC 81 MG tablet Take 81 mg by mouth daily. Swallow whole.     gabapentin  (NEURONTIN ) 300 MG capsule Take 1 capsule (300 mg total) by mouth 2 (two) times daily. 60 capsule 1   ibuprofen (ADVIL) 200 MG tablet Take 400 mg by mouth every 8 (eight) hours as needed (pain.).     metFORMIN  (GLUCOPHAGE ) 500 MG tablet Take 2 tablets (1,000 mg total) by mouth 2 (two) times  daily with a meal. 360 tablet 1   metoprolol  succinate (TOPROL -XL) 100 MG 24 hr tablet Take 1 tablet (100 mg total) by mouth daily. Take with or immediately following a meal. 90 tablet 1   Multiple Vitamins-Minerals (MULTIVITAMIN ADULT PO) Take 1 tablet by mouth in the morning.     omega-3 acid ethyl esters (LOVAZA ) 1 g capsule TAKE 2 CAPSULES TWICE A DAY 360 capsule 1   omeprazole  (PRILOSEC) 20 MG capsule Take 1 capsule (20  mg total) by mouth daily. 90 capsule 1   rosuvastatin  (CRESTOR ) 40 MG tablet Take 1 tablet (40 mg total) by mouth daily. 90 tablet 1   tamsulosin  (FLOMAX ) 0.4 MG CAPS capsule Take 2 capsules (0.8 mg total) by mouth daily. 180 capsule 1   traMADol (ULTRAM) 50 MG tablet Take 1 tablet (50 mg total) by mouth every 6 (six) hours as needed (mild pain). 30 tablet 0   No current facility-administered medications for this visit.    Physical Exam BP (!) 96/57 (BP Location: Right Arm, Patient Position: Sitting, Cuff Size: Normal)   Pulse 70   Resp 20   Ht 6' (1.829 m)   Wt 166 lb 3.2 oz (75.4 kg)   SpO2 97% Comment: RA  BMI 22.97 kg/m  68 year old man in no acute distress Alert and oriented x 3 with no focal deficits Lungs diminished breath sounds at left base otherwise clear Cardiac regular rate and rhythm No peripheral edema Incisions clean dry and intact  Diagnostic Tests: CHEST - 2 VIEW   COMPARISON:  10/31/2023   FINDINGS: Frontal and lateral views of the chest demonstrate postsurgical changes from CABG. There is chronic elevation of the left hemidiaphragm. Interval resolution of the left pneumothorax seen previously. Persistent increased density within the left lower chest, likely a combination of atelectasis and trace left effusion or pleural thickening. Right chest is clear. No acute bony abnormalities.   IMPRESSION: 1. Resolution of the small left apical pneumothorax seen on prior exam. 2. Persistent elevation of the left hemidiaphragm, with likely left basilar atelectasis and small left effusion/pleural thickening.     Electronically Signed   By: Ozell Daring M.D.   On: 11/11/2023 16:23   I personally reviewed the chest x-ray images.  Postoperative changes from left upper lobectomy.  Impression: [place] is a 68 year old man with a past history significant for tobacco use, emphysema, CAD, CABG, diastolic dysfunction, aortic atherosclerosis, type 2 diabetes,  hypertension, hyperlipidemia, PAD, BPH, degenerative disc disease, cholelithiasis, nephrolithiasis, and lung cancer.  Squamous cell carcinoma left upper lobe-T3, N0, stage IIB.  Will refer to oncology at [facility] for consideration for adjuvant chemotherapy.  Tissue was sent for PD-L1 testing to see if he might benefit from immunotherapy.  Status post left upper lobectomy-doing well.  Has minimal discomfort, not requiring narcotics.  Has good exercise tolerance.  His left hemidiaphragm is elevated.  Phrenic nerve was easily identified around the lung but scarring superiorly around the mammary harvest site would make it susceptible to potential injury.  Asymptomatic at present.  Will follow.  Plan: Refer to oncology at [facility] I will plan to see him back in 6 weeks with a PA and lateral chest x-ray to check on his progress  Elspeth JAYSON Millers, MD [facility] [phone]

## 2023-11-12 LAB — ACID FAST CULTURE WITH REFLEXED SENSITIVITIES (MYCOBACTERIA): Acid Fast Culture: NEGATIVE

## 2023-11-14 ENCOUNTER — Encounter (HOSPITAL_COMMUNITY): Payer: Self-pay | Admitting: Internal Medicine

## 2023-11-19 ENCOUNTER — Telehealth: Payer: Self-pay

## 2023-11-19 ENCOUNTER — Ambulatory Visit: Payer: Self-pay | Admitting: Oncology

## 2023-11-19 ENCOUNTER — Inpatient Hospital Stay

## 2023-11-19 ENCOUNTER — Encounter: Payer: Self-pay | Admitting: *Deleted

## 2023-11-19 ENCOUNTER — Encounter: Payer: Self-pay | Admitting: Oncology

## 2023-11-19 ENCOUNTER — Inpatient Hospital Stay: Attending: Oncology | Admitting: Oncology

## 2023-11-19 VITALS — BP 121/76 | HR 71 | Temp 96.7°F | Resp 18 | Ht 72.0 in | Wt 161.0 lb

## 2023-11-19 DIAGNOSIS — J439 Emphysema, unspecified: Secondary | ICD-10-CM | POA: Diagnosis not present

## 2023-11-19 DIAGNOSIS — Z8042 Family history of malignant neoplasm of prostate: Secondary | ICD-10-CM | POA: Insufficient documentation

## 2023-11-19 DIAGNOSIS — F17290 Nicotine dependence, other tobacco product, uncomplicated: Secondary | ICD-10-CM | POA: Insufficient documentation

## 2023-11-19 DIAGNOSIS — Z902 Acquired absence of lung [part of]: Secondary | ICD-10-CM | POA: Diagnosis not present

## 2023-11-19 DIAGNOSIS — C3412 Malignant neoplasm of upper lobe, left bronchus or lung: Secondary | ICD-10-CM | POA: Diagnosis present

## 2023-11-19 DIAGNOSIS — C3492 Malignant neoplasm of unspecified part of left bronchus or lung: Secondary | ICD-10-CM

## 2023-11-19 DIAGNOSIS — Z7289 Other problems related to lifestyle: Secondary | ICD-10-CM | POA: Insufficient documentation

## 2023-11-19 DIAGNOSIS — Z800 Family history of malignant neoplasm of digestive organs: Secondary | ICD-10-CM | POA: Insufficient documentation

## 2023-11-19 LAB — CBC WITH DIFFERENTIAL/PLATELET
Abs Immature Granulocytes: 0.03 K/uL (ref 0.00–0.07)
Basophils Absolute: 0.1 K/uL (ref 0.0–0.1)
Basophils Relative: 1 %
Eosinophils Absolute: 0.1 K/uL (ref 0.0–0.5)
Eosinophils Relative: 1 %
HCT: 39.4 % (ref 39.0–52.0)
Hemoglobin: 13.3 g/dL (ref 13.0–17.0)
Immature Granulocytes: 0 %
Lymphocytes Relative: 18 %
Lymphs Abs: 1.4 K/uL (ref 0.7–4.0)
MCH: 30.6 pg (ref 26.0–34.0)
MCHC: 33.8 g/dL (ref 30.0–36.0)
MCV: 90.8 fL (ref 80.0–100.0)
Monocytes Absolute: 0.8 K/uL (ref 0.1–1.0)
Monocytes Relative: 10 %
Neutro Abs: 5.6 K/uL (ref 1.7–7.7)
Neutrophils Relative %: 70 %
Platelets: 252 K/uL (ref 150–400)
RBC: 4.34 MIL/uL (ref 4.22–5.81)
RDW: 13.4 % (ref 11.5–15.5)
WBC: 8 K/uL (ref 4.0–10.5)
nRBC: 0 % (ref 0.0–0.2)

## 2023-11-19 LAB — COMPREHENSIVE METABOLIC PANEL WITH GFR
ALT: 22 U/L (ref 0–44)
AST: 28 U/L (ref 15–41)
Albumin: 4.1 g/dL (ref 3.5–5.0)
Alkaline Phosphatase: 62 U/L (ref 38–126)
Anion gap: 9 (ref 5–15)
BUN: 17 mg/dL (ref 8–23)
CO2: 24 mmol/L (ref 22–32)
Calcium: 9 mg/dL (ref 8.9–10.3)
Chloride: 104 mmol/L (ref 98–111)
Creatinine, Ser: 0.85 mg/dL (ref 0.61–1.24)
GFR, Estimated: >60 mL/min (ref >60)
Glucose, Bld: 151 mg/dL — ABNORMAL HIGH (ref 70–99)
Potassium: 4.8 mmol/L (ref 3.5–5.1)
Sodium: 137 mmol/L (ref 135–145)
Total Bilirubin: 0.8 mg/dL (ref 0.0–1.2)
Total Protein: 7.1 g/dL (ref 6.5–8.1)

## 2023-11-19 MED ORDER — DEXAMETHASONE 4 MG PO TABS
ORAL_TABLET | ORAL | 1 refills | Status: DC
Start: 2023-11-19 — End: 2023-11-20

## 2023-11-19 MED ORDER — PROCHLORPERAZINE MALEATE 10 MG PO TABS: 10.0000 mg | ORAL_TABLET | Freq: Four times a day (QID) | ORAL | 1 refills | Status: DC | PRN

## 2023-11-19 MED ORDER — ONDANSETRON HCL 8 MG PO TABS: 8.0000 mg | ORAL_TABLET | Freq: Three times a day (TID) | ORAL | 1 refills | Status: DC | PRN

## 2023-11-19 NOTE — Progress Notes (Signed)
 START ON PATHWAY REGIMEN - Non-Small Cell Lung     A cycle is every 21 days:     Docetaxel      Cisplatin   **Always confirm dose/schedule in your pharmacy ordering system**  Patient Characteristics: Postoperative without Neoadjuvant Therapy (Pathologic Staging), Stage IB (Tumor Size = 4 cm) or Stage II / III, Adjuvant Chemotherapy Planned, Stage IB (Tumor Size = 4 cm), or Stage II / III, Squamous Cell Therapeutic Status: Postoperative without Neoadjuvant Therapy (Pathologic Staging) AJCC M Category: cM0 AJCC 9 Stage Grouping: IIB AJCC N Category: pN0 AJCC T Category: pT3 Check here if patient was staged using an edition other than AJCC Staging 9th Edition: false Adjuvant Chemotherapy Status: Adjuvant Chemotherapy Planned Histology: Squamous Cell Intent of Therapy: Curative Intent, Discussed with Patient

## 2023-11-19 NOTE — Progress Notes (Signed)
 Hematology/Oncology Consult Note [phone] [phone]     REFERRING PROVIDER: Vicci Bouchard P, DO    CHIEF COMPLAINTS/PURPOSE OF CONSULTATION:  Left upper lobe stage IIb squamous cell carcinoma  ASSESSMENT & PLAN:   Cancer Staging  Squamous cell carcinoma of left lung (HCC) Staging form: Lung, AJCC V9 - Pathologic stage from 11/19/2023: Stage IIB (pT3, pN0, cM0) - Signed by Babara Call, MD on 11/19/2023 '  Squamous cell carcinoma of left lung (HCC) Left upper lobe squamous cell carcinoma x 2, pT3 pN0 cM0, involvement of the visceral pleural surface Status post left upper lobectomy I recommend adjuvant chemotherapy. Patient's kidney function has improved.  Recommend cisplatin plus docetaxel q3 weeks x 4 cycles.  Consider dose reduction given his performance status. Rationale and potential side effects were reviewed discussed with patient.  He is in agreement. After finishing adjuvant chemotherapy, recommend adjuvant immunotherapy. Hearing testing and chemotherapy. Mediport is optional.    COPD (chronic obstructive pulmonary disease) with emphysema (HCC) Stable.  Monitor.  Current every day vaping Recommend cessation.   Orders Placed This Encounter  Procedures   CBC with Differential/Platelet    Standing Status:   Future    Number of Occurrences:   1    Expected Date:   11/19/2023    Expiration Date:   02/17/2024   Comprehensive metabolic panel with GFR    Standing Status:   Future    Number of Occurrences:   1    Expected Date:   11/19/2023    Expiration Date:   02/17/2024   Follow-up in a few weeks to start treatments. All questions were answered. The patient knows to call the clinic with any problems, questions or concerns.  Call Babara, MD, PhD [facility] Hematology Oncology 11/19/2023    HISTORY OF PRESENTING ILLNESS:  Maxwell Young 68 y.o. male presents to establish care for  I have reviewed his chart and materials related to his  cancer extensively and collaborated history with the patient. Summary of oncologic history is as follows: Oncology History  Squamous cell carcinoma of left lung (HCC)  03/17/2023 Imaging   CT chest normal cancer screening showed  New 12.8 mm irregular anterior segment left upper lobe nodule. Lung-RADS 4B, suspicious.   04/16/2023 Imaging   PET scan showed Mild abnormal radiotracer uptake seen along the 12 mm spiculated left upper lobe lung nodule   05/16/2023 Imaging   CT super D chest without contrast 1. Irregular left upper lobe nodule, hypermetabolic on 04/16/2023 and new from 03/14/2022   10/02/2023 Initial Diagnosis   Squamous cell carcinoma of left lung   09/29/2023 patient underwent bronchoscopy biopsy of left upper lobe. Pathology showed invasive moderately differentiated squamous cell carcinoma.  10/27/2023, patient underwent left upper lobe lobectomy Pathology showed A left upper lobectomy  moderately differentiated squamous cell carcinoma x 2 (2.5 and 1.7 cm  in greatest dimension respectively) with involvement of visceral pleural  surface (PL2).  -  Margins negative  B. LYMPH NODE, LEVEL 9, EXCISION: -  1 lymph node, negative for malignancy (0/1).  C. LYMPH NODE, LEVEL 8, EXCISION: -  1 lymph node, negative for malignancy (0/1).  D. LYMPH NODE, LEVEL 10, EXCISION: -  1 lymph node, negative for malignancy (0/1).  E. LYMPH NODE, LEVEL 7, EXCISION: -  1 lymph node, negative for malignancy (0/1).  F. LYMPH NODE, LEVEL 12, EXCISION: -  1 lymph node, negative for malignancy (0/1).  G. LYMPH NODE, LEVEL 11, EXCISION: -  1 lymph  node, negative for malignancy (0/1).  H. LYMPH NODE, LEVEL 12 #2, EXCISION: -  1 lymph node, negative for malignancy (0/1).  I. LYMPH NODE, LEVEL 11 #2, EXCISION: -  1 lymph node, negative for malignancy (0/1).  J. LYMPH NODE, LEVEL 13, EXCISION: -  1 lymph node, negative for malignancy (0/1).  K. LYMPH NODE, LEVEL 10 #2, EXCISION: -  1  lymph node, negative for malignancy (0/1).   Total Number of Primary Tumors: 2 Procedure: Lobectomy Specimen Laterality: Left Tumor Focality: Bifocal Tumor Site: Upper lobe Tumor Size: 2.5 x 1.6 x 1.4 and 1.7 x 1.0 x 0.8      Total Tumor Size: Same as above      Invasive Tumor Size (applies only to invasive nonmucinous adenocarcinoma with a lepidic           component): N/A Histologic Type: Both are moderately differentiated squamous cell carcinoma Visceral Pleura Invasion: Present (PL2) Direct Invasion of Adjacent Structures: No adjacent structures present Lymphovascular Invasion: Not identified Margins: All margins negative for invasive carcinoma      Closest Margin(s) to Invasive Carcinoma:      Margin(s) Involved by Invasive Carcinoma: 4.5 cm from vascular and bronchial margins      Margin Status for Non-Invasive Tumor: N/A Treatment Effect: No known presurgical therapy      Percentage of Residual Viable Tumor: N/A Regional Lymph Nodes:      Number of Lymph Nodes Involved: 0                           Nodal Sites with Tumor: 0      Number of Lymph Nodes Examined: 13                      Nodal Sites Examined: 7 (7, 8, 9, 10, 11, 12, 13) Distant Metastasis:      Distant Site(s) Involved: Not applicable Pathologic Stage Classification (pTNM, AJCC 8th Edition):  pT 3, pN 0   PD-L1 CPS 40  cmet negative.   10/22/2023 Imaging   PET scan showed 1. Band-like subpleural solid process in the left upper lobe with mildly decreased but still abnormal metabolic activity; malignancy not excluded. 2. Ill-defined partially subsolid anterior left upper lobe nodule with mildly decreased but still abnormal metabolic activity. 3. Severe emphysema. 4. Small abdominal aortic aneurysm measuring 3.0 cm. 5. Substantial atherosclerosis. 6. Emphysema. 7. Cholelithiasis.   11/19/2023 Cancer Staging   Staging form: Lung, AJCC V9 - Pathologic stage from 11/19/2023: Stage IIB (pT3, pN0, cM0) -  Signed by Babara Call, MD on 11/19/2023 Stage prefix: Initial diagnosis   12/01/2023 -  Chemotherapy   Patient is on Treatment Plan : LUNG Cisplatin + Docetaxel q21d      Patient presented to establish care and evaluation of adjuvant treatments.  He tolerated lobectomy surgery.  Denies shortness of breath, chest pain. He has lost couple of pounds after surgery.  Some soreness, he copes well. Accompanied by son. He lives at home with his wife who is disabled.  He is a caregiver.     MEDICAL HISTORY:  Past Medical History:  Diagnosis Date   Abdominal aortic ectasia 08/29/2022   a.) US  aorta 08/29/2022: 2.6 cm   Aortic atherosclerosis    Bilateral inguinal hernia    BPH with elevated PSA and lower urinary tract symptoms    CAD (coronary artery disease) 11/13/2006   a.) LHC 11/13/2006: 90% mLAD, 70% D1, 60%  D2, 70% pLCx, 100% pRCA, 95% RV marginal - refer to CVTS; b.) s/p 3v CABG 11/21/2006   Cholelithiasis    Colon polyps 01/21/2009   DDD (degenerative disc disease), cervical    a.) s/p C4-C6 discectomy with interbody fusion and anterior plating 06/29/2001   DDD (degenerative disc disease), thoracic    Diastolic dysfunction    a.) TTE 12/11/2022: EF 60-65%, no RWMAs, G1DD, norm RVSF, mild MR, mild-mod TR, AoV sclerosis without stenosis.   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    S/P CABG x 3 11/21/2006   a.) LIMA-LAD, SVG-OM2, SVG-PDA   T2DM (type 2 diabetes mellitus) (HCC)    Vapes  nicotine containing substance     SURGICAL HISTORY: Past Surgical History:  Procedure Laterality Date   BRONCHOSCOPY, WITH BIOPSY USING ELECTROMAGNETIC NAVIGATION Left 05/26/2023   Procedure: BRONCHOSCOPY, WITH BIOPSY USING ELECTROMAGNETIC NAVIGATION;  Surgeon: Tamea Dedra CROME, MD;  Location: [facility];  Service: Pulmonary;  Laterality: Left;   CARDIAC CATHETERIZATION     CATARACT EXTRACTION W/ INTRAOCULAR LENS IMPLANT Right 03/04/2022   CERVICAL FUSION N/A 06/29/2001   COLONOSCOPY     COLONOSCOPY N/A 09/14/2020   Procedure: COLONOSCOPY;  Surgeon: Jinny Carmine, MD;  Location: [facility];  Service: Endoscopy;  Laterality: N/A;   COLONOSCOPY WITH PROPOFOL  N/A 07/24/2015   Procedure: COLONOSCOPY WITH PROPOFOL ;  Surgeon: Carmine Jinny, MD;  Location: [facility];  Service: Endoscopy;  Laterality: N/A;   CORONARY ARTERY BYPASS GRAFT  11/21/2006   Procedure: CORONARY ARTERY BYPASS GRAFT; Location: [facility]; Surgeon: Lajoyce Farr, MD   ENDOBRONCHIAL ULTRASOUND Left 09/29/2023   Procedure: ENDOBRONCHIAL ULTRASOUND (EBUS);  Surgeon: Tamea Dedra CROME, MD;  Location: [facility];  Service: Pulmonary;  Laterality: Left;   HERNIA REPAIR Bilateral    with mesh   INSERTION OF MESH  01/07/2023   Procedure: INSERTION OF MESH;  Surgeon: Jordis Laneta FALCON, MD;  Location: [facility];  Service: General;;   INTERCOSTAL NERVE BLOCK  10/27/2023   Procedure: BLOCK, NERVE, INTERCOSTAL;  Surgeon: Kerrin Elspeth BROCKS, MD;  Location: [facility];  Service: Thoracic;;   LEFT HEART CATH AND CORONARY ANGIOGRAPHY Left 11/13/2006   Procedure: LEFT HEART CATH AND CORONARY ANGIOGRAPHY; Location: [facility]; Surgeon: Denyse Bathe, MD   LOBECTOMY, LUNG, ROBOT-ASSISTED, USING VATS Left 10/27/2023   Procedure: LOBECTOMY, LUNG, LEFT UPPER ROBOT-ASSISTED, USING VATS;  Surgeon: Kerrin Elspeth BROCKS, MD;  Location: [facility];  Service: Thoracic;  Laterality: Left;  ROBOTIC LEFT UPPER LOBECTOMY   LYMPH NODE BIOPSY Left 10/27/2023   Procedure: LYMPH  NODE BIOPSY;  Surgeon: Kerrin Elspeth BROCKS, MD;  Location: [facility] OR;  Service: Thoracic;  Laterality: Left;   POLYPECTOMY  07/24/2015   Procedure: POLYPECTOMY;  Surgeon: Carmine Jinny, MD;  Location: [facility];  Service: Endoscopy;;   VIDEO BRONCHOSCOPY WITH ENDOBRONCHIAL NAVIGATION Left 09/29/2023   Procedure: VIDEO BRONCHOSCOPY WITH ENDOBRONCHIAL NAVIGATION;  Surgeon: Tamea Dedra CROME, MD;  Location: [facility]  [facility];  Service: Pulmonary;  Laterality: Left;   VIDEO BRONCHOSCOPY WITH ENDOBRONCHIAL ULTRASOUND Left 05/26/2023   Procedure: BRONCHOSCOPY, WITH EBUS;  Surgeon: Tamea Dedra CROME, MD;  Location: [facility];  Service: Pulmonary;  Laterality: Left;    SOCIAL HISTORY: Social History   Socioeconomic History   Marital status: Married    Spouse name: Meaney,Lisa (Spouse)   Number of children: Not on file   Years of education: Not on file   Highest education level: Associate degree: occupational, technical, or vocational program  Occupational History   Not on file  Tobacco Use   Smoking status: Former    Current packs/day: 0.00    Average packs/day: 1.5 packs/day for 45.8 years (68.7 ttl pk-yrs)    Types: E-cigarettes, Cigarettes    Start date: 64    Quit date: 11/21/2021    Years since quitting: 1.9    Passive exposure: Past   Smokeless tobacco: Never   Tobacco comments:    Currently vapes.  Vaping Use   Vaping status: Every Day   Start date: 12/21/2021   Substances: Nicotine, Flavoring   Devices: mint flavor  Substance and Sexual Activity   Alcohol use: No   Drug use: No   Sexual activity: Yes  Other Topics Concern   Not on file  Social History Narrative   Not on file   Social Drivers of Health   Financial Resource Strain: Low Risk  (07/06/2023)   Overall Financial Resource Strain (CARDIA)    Difficulty of Paying Living Expenses: Not very hard  Food Insecurity: No Food Insecurity (11/15/2023)   Hunger Vital Sign    Worried About Running Out of Food in the  Last Year: Never true    Ran Out of Food in the Last Year: Never true  Transportation Needs: No Transportation Needs (11/15/2023)   PRAPARE - Administrator, Civil Service (Medical): No    Lack of Transportation (Non-Medical): No  Physical Activity: Insufficiently Active (07/06/2023)   Exercise Vital Sign    Days of Exercise per Week: 3 days    Minutes of Exercise per Session: 20 min  Stress: No Stress Concern Present (07/06/2023)   [employer] of Occupational Health - Occupational Stress Questionnaire    Feeling of Stress: Not at all  Social Connections: Moderately Integrated (10/27/2023)   Social Connection and Isolation Panel    Frequency of Communication with Friends and Family: More than three times a week    Frequency of Social Gatherings with Friends and Family: More than three times a week    Attends Religious Services: 1 to 4 times per year    Active Member of [employer] or Organizations: No    Attends Banker Meetings: Never    Marital Status: Married  Catering Manager Violence: Unknown (10/27/2023)   Humiliation, Afraid, Rape, and Kick questionnaire    Fear of Current or Ex-Partner: Patient declined    Emotionally Abused: Patient declined    Physically Abused: Not on file    Sexually Abused: Patient declined    FAMILY HISTORY: Family History  Problem Relation Age of Onset   Hypertension Mother    Heart disease Mother        CAD   Heart failure Mother    Colon cancer Father    Stroke Father    Parkinson's disease Father    Prostate cancer Father     ALLERGIES:  has no known allergies.  MEDICATIONS:  Current Outpatient Medications  Medication Sig Dispense Refill  acetaminophen  (TYLENOL ) 500 MG tablet Take 2 tablets (1,000 mg total) by mouth every 6 (six) hours as needed.     amLODipine  (NORVASC ) 10 MG tablet Take 1 tablet (10 mg total) by mouth daily. 90 tablet 1   aspirin EC 81 MG tablet Take 81 mg by mouth daily. Swallow whole.      ibuprofen (ADVIL) 200 MG tablet Take 400 mg by mouth every 8 (eight) hours as needed (pain.).     metFORMIN  (GLUCOPHAGE ) 500 MG tablet Take 2 tablets (1,000 mg total) by mouth 2 (two) times daily with a meal. 360 tablet 1   metoprolol  succinate (TOPROL -XL) 100 MG 24 hr tablet Take 1 tablet (100 mg total) by mouth daily. Take with or immediately following a meal. 90 tablet 1   Multiple Vitamins-Minerals (MULTIVITAMIN ADULT PO) Take 1 tablet by mouth in the morning.     omega-3 acid ethyl esters (LOVAZA ) 1 g capsule TAKE 2 CAPSULES TWICE A DAY 360 capsule 1   omeprazole  (PRILOSEC) 20 MG capsule Take 1 capsule (20 mg total) by mouth daily. 90 capsule 1   rosuvastatin  (CRESTOR ) 40 MG tablet Take 1 tablet (40 mg total) by mouth daily. 90 tablet 1   tamsulosin  (FLOMAX ) 0.4 MG CAPS capsule Take 2 capsules (0.8 mg total) by mouth daily. 180 capsule 1   gabapentin  (NEURONTIN ) 300 MG capsule Take 1 capsule (300 mg total) by mouth 2 (two) times daily. (Patient not taking: Reported on 11/19/2023) 60 capsule 1   traMADol (ULTRAM) 50 MG tablet Take 1 tablet (50 mg total) by mouth every 6 (six) hours as needed (mild pain). (Patient not taking: Reported on 11/19/2023) 30 tablet 0   No current facility-administered medications for this visit.    Review of Systems  Constitutional:  Negative for appetite change, chills, fatigue and fever.  HENT:   Negative for hearing loss and voice change.   Eyes:  Negative for eye problems.  Respiratory:  Negative for chest tightness, cough and shortness of breath.   Cardiovascular:  Negative for chest pain.  Gastrointestinal:  Negative for abdominal distention, abdominal pain and blood in stool.  Endocrine: Negative for hot flashes.  Genitourinary:  Negative for difficulty urinating and frequency.   Musculoskeletal:  Negative for arthralgias.  Skin:  Negative for itching and rash.  Neurological:  Negative for extremity weakness.  Hematological:  Negative for adenopathy.   Psychiatric/Behavioral:  Negative for confusion.      PHYSICAL EXAMINATION: ECOG PERFORMANCE STATUS: 1 - Symptomatic but completely ambulatory  Vitals:   11/19/23 1359  BP: 121/76  Pulse: 71  Resp: 18  Temp: (!) 96.7 F (35.9 C)  SpO2: 100%   Filed Weights   11/19/23 1359  Weight: 161 lb (73 kg)    Physical Exam Constitutional:      General: He is not in acute distress.    Appearance: He is not diaphoretic.  HENT:     Head: Normocephalic and atraumatic.  Eyes:     General: No scleral icterus. Cardiovascular:     Rate and Rhythm: Normal rate and regular rhythm.     Heart sounds: No murmur heard. Pulmonary:     Effort: Pulmonary effort is normal. No respiratory distress.     Comments: Decreased breath sound bilaterally Abdominal:     General: There is no distension.     Palpations: Abdomen is soft.     Tenderness: There is no abdominal tenderness.  Musculoskeletal:        General: Normal range  of motion.     Cervical back: Normal range of motion and neck supple.  Skin:    General: Skin is warm and dry.     Findings: No erythema.  Neurological:     Mental Status: He is alert and oriented to person, place, and time. Mental status is at baseline.     Motor: No abnormal muscle tone.  Psychiatric:        Mood and Affect: Mood and affect normal.      LABORATORY DATA:  I have reviewed the data as listed    Latest Ref Rng & Units 11/19/2023    2:58 PM 10/29/2023    2:52 AM 10/28/2023    3:32 AM  CBC  WBC 4.0 - 10.5 K/uL 8.0  8.6  8.0   Hemoglobin 13.0 - 17.0 g/dL 86.6  87.9  87.0   Hematocrit 39.0 - 52.0 % 39.4  34.9  37.9   Platelets 150 - 400 K/uL 252  149  145       Latest Ref Rng & Units 11/19/2023    2:58 PM 10/30/2023    2:03 AM 10/29/2023    2:52 AM  CMP  Glucose 70 - 99 mg/dL 848  882  860   BUN 8 - 23 mg/dL 17  20  20    Creatinine 0.61 - 1.24 mg/dL 9.14  8.87  8.73   Sodium 135 - 145 mmol/L 137  138  137   Potassium 3.5 - 5.1 mmol/L 4.8  3.8   3.8   Chloride 98 - 111 mmol/L 104  105  105   CO2 22 - 32 mmol/L 24  20  22    Calcium  8.9 - 10.3 mg/dL 9.0  8.7  8.5   Total Protein 6.5 - 8.1 g/dL 7.1   5.4   Total Bilirubin 0.0 - 1.2 mg/dL 0.8   0.6   Alkaline Phos 38 - 126 U/L 62   43   AST 15 - 41 U/L 28   19   ALT 0 - 44 U/L 22   11      RADIOGRAPHIC STUDIES: I have personally reviewed the radiological images as listed and agreed with the findings in the report. DG Chest 2 View Result Date: 11/11/2023 CLINICAL DATA:  Pulmonary nodule EXAM: CHEST - 2 VIEW COMPARISON:  10/31/2023 FINDINGS: Frontal and lateral views of the chest demonstrate postsurgical changes from CABG. There is chronic elevation of the left hemidiaphragm. Interval resolution of the left pneumothorax seen previously. Persistent increased density within the left lower chest, likely a combination of atelectasis and trace left effusion or pleural thickening. Right chest is clear. No acute bony abnormalities. IMPRESSION: 1. Resolution of the small left apical pneumothorax seen on prior exam. 2. Persistent elevation of the left hemidiaphragm, with likely left basilar atelectasis and small left effusion/pleural thickening. Electronically Signed   By: Ozell Daring M.D.   On: 11/11/2023 16:23   DG Chest 2 View Result Date: 10/31/2023 EXAM: 2 VIEW(S) XRAY OF THE CHEST 10/31/2023 05:18:00 AM COMPARISON: 10/30/2023 CLINICAL HISTORY: Encounter for chest tube removal FINDINGS: LINES, TUBES AND DEVICES: Left chest tube removed. LUNGS AND PLEURA: Small left apical pneumothorax. Volume loss in left hemithorax with scarring. Elevated left hemidiaphragm. Emphysema. No focal pulmonary opacity. No pulmonary edema. No pleural effusion. HEART AND MEDIASTINUM: Status post median sternotomy and CABG. No acute abnormality of the cardiac and mediastinal silhouettes. BONES AND SOFT TISSUES: Left chest wall subcutaneous emphysema. Lower cervical spine ACDF hardware noted. No  acute osseous  abnormality. IMPRESSION: 1. Interval removal of left chest tube with small left apical pneumothorax which appears more conspicuous on the current exam. Discharge . 2. Left chest wall subcutaneous emphysema. 3. Left hemithorax volume loss with scarring and elevated left hemidiaphragm. 4. Emphysema. 5. Status post median sternotomy and CABG. Electronically signed by: Waddell Calk MD 10/31/2023 07:12 AM EDT RP Workstation: [id number]   DG Chest 1V REPEAT Same Day Result Date: 10/30/2023 CLINICAL DATA:  Status post lobectomy of lung. EXAM: CHEST - 1 VIEW SAME DAY COMPARISON:  Earlier today FINDINGS: Left-sided chest tube remains in place with tip directed towards the apex. Tiny left apical pneumothorax persists. Subcutaneous emphysema in the left chest wall. Postsurgical volume loss in the left hemithorax. Right lung emphysema and scarring. Stable heart size and mediastinal contours. IMPRESSION: Left-sided chest tube remains in place. Tiny left apical pneumothorax persists. Electronically Signed   By: Andrea Gasman M.D.   On: 10/30/2023 13:50   DG CHEST PORT 1 VIEW Result Date: 10/30/2023 CLINICAL DATA:  Status post left upper lobe lobectomy and lymph node biopsy on 10/27/2023. EXAM: PORTABLE CHEST 1 VIEW COMPARISON:  10/29/2023 FINDINGS: Left chest tube remains in place with an interval minimal left upper pneumothorax. Stable post CABG changes and left hemithorax surgical clips. Stable elevation of the left hemidiaphragm. Minimal bibasilar atelectasis. Small amount of left lateral subcutaneous emphysema with improvement. Unremarkable bones. IMPRESSION: 1. Interval minimal left upper pneumothorax. 2. Minimal bibasilar atelectasis. These results will be called to the ordering clinician or representative by the Radiologist Assistant, and communication documented in the PACS or [employer]. Electronically Signed   By: Elspeth Bathe M.D.   On: 10/30/2023 11:15   DG CHEST PORT 1 VIEW Result Date:  10/29/2023 CLINICAL DATA:  Postop from left upper lobectomy. EXAM: PORTABLE CHEST 1 VIEW COMPARISON:  10/28/2023 FINDINGS: Left chest tube remains in place. No residual pneumothorax seen. Mild subcutaneous emphysema is seen in the left chest wall. Left lung volume loss noted with central left lung subsegmental atelectasis. Right lung is clear. Pulmonary emphysema again noted. Prior CABG. Heart size is stable. IMPRESSION: Left chest tube remains in place. No residual pneumothorax visualized. Left lung volume loss with central left lung subsegmental atelectasis. Electronically Signed   By: Norleen DELENA Kil M.D.   On: 10/29/2023 06:55   DG Chest Port 1 View Result Date: 10/28/2023 EXAM: 1 VIEW(S) XRAY OF THE CHEST 10/28/2023 05:34:00 AM COMPARISON: 10/27/2023 CLINICAL HISTORY: S/P lobectomy of lung FINDINGS: LINES, TUBES AND DEVICES: Left chest tube stable in position. LUNGS AND PLEURA: Stable postsurgical changes involving the left lung. Similar to slight interval decrease in small left lateral pneumothorax. Elevated left hemidiaphragm. Mild left lung base atelectasis. Background emphysema. No focal pulmonary opacity. No pulmonary edema. No pleural effusion. HEART AND MEDIASTINUM: Sternotomy wires and CABG changes noted. No acute abnormality of the cardiac and mediastinal silhouettes. BONES AND SOFT TISSUES: No acute osseous abnormality. IMPRESSION: 1. Slight interval decrease in small left lateral pneumothorax. 2. Left chest tube stable in position. 3. Mild left lung base atelectasis. 4. Postoperative changes of left lung. 5. Elevated left hemidiaphragm. 6. Background emphysema. Electronically signed by: Waddell Calk MD 10/28/2023 07:07 AM EDT RP Workstation: [id number]   DG Chest Port 1 View Result Date: 10/27/2023 CLINICAL DATA:  Status post lobectomy of lung. EXAM: PORTABLE CHEST 1 VIEW COMPARISON:  Preoperative radiograph 10/23/2023 FINDINGS: Postsurgical change in the left hemithorax with postsurgical volume  loss. Left-sided chest tube in place with tip directed  towards the apex. Small to moderate size pneumothorax, pleural line best demonstrated laterally. Patchy atelectasis at the left lung base. Diffuse emphysematous changes and bullous disease. Unchanged heart size and mediastinal contours post remote median sternotomy per IMPRESSION: 1. Postsurgical change in the left hemithorax with chest tube in place. Small to moderate size left pneumothorax. 2. Patchy atelectasis at the left lung base. Electronically Signed   By: Andrea Gasman M.D.   On: 10/27/2023 19:08   DG Chest 2 View Result Date: 10/23/2023 CLINICAL DATA:  Squamous cell carcinoma of left lung. Preop chest exam EXAM: CHEST - 2 VIEW COMPARISON:  Radiograph 09/29/2023.  CT 09/26/2023 FINDINGS: The cardiomediastinal contours are normal. Post median sternotomy. Advanced emphysema. Ill-defined opacity in the left mid lung likely representing nodular density on prior CT. Pulmonary vasculature is normal. No pleural effusion or pneumothorax. No acute osseous abnormalities are seen. IMPRESSION: 1. Ill-defined opacity in the left mid lung likely representing nodular density on prior CT. 2. Advanced emphysema. Electronically Signed   By: Andrea Gasman M.D.   On: 10/23/2023 18:08   NM PET Image Restag (PS) Skull Base To Thigh Result Date: 10/22/2023 EXAM: PET AND CT SKULL BASE TO MID THIGH 10/22/2023 02:53:24 PM TECHNIQUE: RADIOPHARMACEUTICAL: 9.4 mCi F-18 FDG Uptake time 60 minutes. Glucose level 164 mg/dl. PET imaging was acquired from the base of the skull to the mid thighs. Non-contrast enhanced computed tomography was obtained for attenuation correction and anatomic localization. COMPARISON: 04/16/2023 and CT chest from 09/26/2023. CLINICAL HISTORY: Left lung nodule, high cancer risk. History of bronchoscopies with left lung biopsy. FINDINGS: HEAD AND NECK: Hypoplastic maxillary sinuses. Bilateral common carotid atheromatous vascular calcifications.  Cervical spine plate and screw fixator. No metabolically active cervical lymphadenopathy. CHEST: The ill-defined and partially subsolid anterior left upper lobe nodule measuring 1.5 x 1.1 cm on image 64 series 6 medially has a maximum SUV of 3.8, formerly 4.9. Further laterally at the same vertical level, the band-like subpleural solid process measuring about 3.1 x 2.5 cm on image 64 series 6 has a maximum SUV of 6.1, formerly 7.2. This process has a higher degree of metabolic activity along its pleural margin superiorly, as before. Malignancy is not excluded. Prior CABG. Thoracic aortic, coronary artery, and branch vessel atheromatous vascular calcifications. Severe emphysema. No metabolically active lymphadenopathy. ABDOMEN AND PELVIS: Cholelithiasis. Fusiform infrarenal abdominal aortic aneurysm 3.0 cm in transverse dimension on image 109 series 6. According to the ACR 2013 and SVS 2018 guidelines, the finding is consistent with a small abdominal aortic aneurysm (3.0 cm), and the recommendation is surveillance imaging at a 3-year interval. Sigmoid colon diverticulosis. Bladder wall thickening may be from nondistention or chronic outlet obstruction. Prostatomegaly noted. Suspected anterior pelvic hernia mesh. Systemic atherosclerosis is present, including the aorta and iliac arteries. Presumed physiologic activity in bowel. No metabolically active intraperitoneal mass. No metabolically active lymphadenopathy. Physiologic activity within the genitourinary systems. BONES AND SOFT TISSUE: No abnormal FDG activity localizes to the bones. No metabolically active aggressive osseous lesion. IMPRESSION: 1. Band-like subpleural solid process in the left upper lobe with mildly decreased but still abnormal metabolic activity; malignancy not excluded. 2. Ill-defined partially subsolid anterior left upper lobe nodule with mildly decreased but still abnormal metabolic activity. 3. Severe emphysema. 4. Small abdominal aortic  aneurysm measuring 3.0 cm. 5. Substantial atherosclerosis. 6. Emphysema. 7. Cholelithiasis. 8. Prostatomegaly. Electronically signed by: Ryan Salvage MD 10/22/2023 03:33 PM EDT RP Workstation: [id number]

## 2023-11-19 NOTE — Assessment & Plan Note (Addendum)
 Left upper lobe squamous cell carcinoma x 2, pT3 pN0 cM0, involvement of the visceral pleural surface Status post left upper lobectomy I recommend adjuvant chemotherapy. Patient's kidney function has improved.  Recommend cisplatin plus docetaxel q3 weeks x 4 cycles.  Consider dose reduction given his performance status. Rationale and potential side effects were reviewed discussed with patient.  He is in agreement. After finishing adjuvant chemotherapy, recommend adjuvant immunotherapy. Hearing testing and chemotherapy. Mediport is optional.

## 2023-11-19 NOTE — Assessment & Plan Note (Signed)
 Stable.  Monitor.

## 2023-11-19 NOTE — Telephone Encounter (Signed)
 [facility] NGS testing (xR+xT) requested on lung specimen [id number] collected on 10/27/23.   Financial assistance application completed and approved. Patient's out-of-pocket cost will be $0

## 2023-11-19 NOTE — Progress Notes (Signed)
 Patient is feeling ok, no symptoms as of right now.

## 2023-11-19 NOTE — Assessment & Plan Note (Signed)
 Recommend cessation

## 2023-11-20 ENCOUNTER — Other Ambulatory Visit: Payer: Self-pay

## 2023-11-20 ENCOUNTER — Encounter: Payer: Self-pay | Admitting: Oncology

## 2023-11-20 NOTE — Addendum Note (Signed)
 Addended by: BABARA CALL on: 11/20/2023 11:27 AM   Modules accepted: Orders

## 2023-11-20 NOTE — Addendum Note (Signed)
 Addended by: BABARA CALL on: 11/20/2023 04:35 PM   Modules accepted: Orders

## 2023-11-27 ENCOUNTER — Inpatient Hospital Stay

## 2023-12-02 ENCOUNTER — Other Ambulatory Visit: Payer: Self-pay | Admitting: *Deleted

## 2023-12-03 ENCOUNTER — Telehealth: Payer: Self-pay | Admitting: *Deleted

## 2023-12-03 ENCOUNTER — Telehealth: Payer: Self-pay | Admitting: Oncology

## 2023-12-03 ENCOUNTER — Encounter: Payer: Self-pay | Admitting: Oncology

## 2023-12-03 DIAGNOSIS — C3492 Malignant neoplasm of unspecified part of left bronchus or lung: Secondary | ICD-10-CM

## 2023-12-03 NOTE — Telephone Encounter (Signed)
 Called pt to sched CT appt - notified of MD follow up - sent appt reminder via mail per pt request - [facility]

## 2023-12-03 NOTE — Telephone Encounter (Signed)
-----   Message from Zelphia Cap sent at 12/02/2023  2:25 PM EST ----- Yes please arrange him to get CT chest w contrast in 6 months  prior to seeing me. ----- Message ----- From: Verdene Gills, RN Sent: 12/02/2023   1:28 PM EST To: Zelphia Cap, MD  Sure, no problem. When do you want his follow up CT scan to be scheduled?  -Nichola Cieslinski ----- Message ----- From: Cap Zelphia, MD Sent: 12/02/2023   1:19 PM EST To: Gills Verdene, RN  Hi Seline Enzor,  I saw this new patient for Stage IIB lung SCC s/p lobectomy.  I recommend adjuvant chemotherapy which he declined.  I discussed his case on TB,no need for Radiation.  Would you please let him know that I recommend him to repeat CT chest w contrast  and follow up with me after that. Please arrange.  Thank you!  Zelphia

## 2023-12-03 NOTE — Telephone Encounter (Signed)
 Pt made aware of MD recommendations and in agreement. Order for CT placed and pt will be called with appts once scheduled. Pt states that he prefers appts in the afternoon.

## 2023-12-05 NOTE — Telephone Encounter (Signed)
 [facility] testing completed. Copy sent to scan and copy given to MD

## 2023-12-06 ENCOUNTER — Other Ambulatory Visit: Payer: Self-pay | Admitting: Family Medicine

## 2023-12-08 ENCOUNTER — Encounter: Payer: Self-pay | Admitting: Oncology

## 2023-12-09 NOTE — Telephone Encounter (Signed)
 Requested Prescriptions  Pending Prescriptions Disp Refills   omega-3 acid ethyl esters (LOVAZA ) 1 g capsule [Pharmacy Med Name: OMEGA-3 ETHYL ESTERS 1 GM CAP] 360 capsule 1    Sig: TAKE 2 CAPSULES BY MOUTH TWICE A DAY     Endocrinology:  Nutritional Agents - omega-3 acid ethyl esters Failed - 12/09/2023 11:39 AM      Failed - Lipid Panel in normal range within the last 12 months    Cholesterol, Total  Date Value Ref Range Status  10/02/2023 91 (L) 100 - 199 mg/dL Final   Cholesterol Piccolo, Waived  Date Value Ref Range Status  08/26/2017 165 <200 mg/dL Final    Comment:                            Desirable                <200                         Borderline High      200- 239                         High                     >239    LDL Chol Calc (NIH)  Date Value Ref Range Status  10/02/2023 28 0 - 99 mg/dL Final   HDL  Date Value Ref Range Status  10/02/2023 38 (L) >39 mg/dL Final   Triglycerides  Date Value Ref Range Status  10/02/2023 149 0 - 149 mg/dL Final   Triglycerides Piccolo,Waived  Date Value Ref Range Status  08/26/2017 412 (H) <150 mg/dL Final    Comment:                            Normal                   <150                         Borderline High     150 - 199                         High                200 - 499                         Very High                >499          Passed - Valid encounter within last 12 months    Recent Outpatient Visits           2 months ago Encounter for annual wellness exam in Medicare patient   [place] [facility] [place], Megan P, DO   5 months ago Type 2 diabetes mellitus with other specified complication, without long-term current use of insulin  (HCC)   [place] [facility] [place], Megan P, DO   8 months ago Essential hypertension   [place] [facility] [place], [place], DO

## 2023-12-10 ENCOUNTER — Inpatient Hospital Stay: Attending: Oncology | Admitting: Hospice and Palliative Medicine

## 2023-12-10 DIAGNOSIS — C3492 Malignant neoplasm of unspecified part of left bronchus or lung: Secondary | ICD-10-CM

## 2023-12-10 NOTE — Progress Notes (Signed)
 Multidisciplinary Oncology Council Documentation  Maxwell Young was presented by our [facility] on 12/10/2023, which included representatives from:  Palliative Care Dietitian  Physical/Occupational Therapist Nurse Navigator Genetics Social work Survivorship RN Financial Navigator Research RN   Maxwell Young currently presents with history of lung cancer  We reviewed previous medical and familial history, history of present illness, and recent lab results along with all available histopathologic and imaging studies. The MOC considered available treatment options and made the following recommendations/referrals:  SW  The MOC is a meeting of clinicians from various specialty areas who evaluate and discuss patients for whom a multidisciplinary approach is being considered. Final determinations in the plan of care are those of the provider(s).   Today's extended care, comprehensive team conference, Maxwell Young was not present for the discussion and was not examined.

## 2023-12-12 ENCOUNTER — Inpatient Hospital Stay

## 2023-12-12 NOTE — Progress Notes (Signed)
 [facility] Clinical Social Work  Clinical Social Work was referred by [facility] for assessment of psychosocial needs.  Clinical Social Worker contacted patient by phone to offer support and assess for needs.  Patient reports no needs or questions at this time.   Interventions: Provided patient with information about CSW role.  Confirmed upcoming appointments.       Follow Up Plan:  Patient will contact CSW with any support or resource needs    Macario CHRISTELLA Au, LCSW  Clinical Social Worker [facility]

## 2023-12-23 ENCOUNTER — Ambulatory Visit: Admitting: Thoracic Surgery (Cardiothoracic Vascular Surgery)

## 2023-12-25 ENCOUNTER — Other Ambulatory Visit: Payer: Self-pay | Admitting: Family Medicine

## 2023-12-27 NOTE — Telephone Encounter (Signed)
 Requested Prescriptions  Pending Prescriptions Disp Refills   omeprazole  (PRILOSEC) 20 MG capsule [Pharmacy Med Name: OMEPRAZOLE  DR 20 MG CAPSULE] 90 capsule 3    Sig: TAKE 1 CAPSULE BY MOUTH EVERY DAY     Gastroenterology: Proton Pump Inhibitors Passed - 12/27/2023  5:53 PM      Passed - Valid encounter within last 12 months    Recent Outpatient Visits           2 months ago Encounter for annual wellness exam in Medicare patient   [place] [facility] [place], Megan P, DO   5 months ago Type 2 diabetes mellitus with other specified complication, without long-term current use of insulin  [facility])   [place] [facility] [place], Megan P, DO   9 months ago Essential hypertension   [place] [facility] [place], [place], DO

## 2023-12-29 ENCOUNTER — Other Ambulatory Visit: Payer: Self-pay | Admitting: Thoracic Surgery (Cardiothoracic Vascular Surgery)

## 2023-12-29 DIAGNOSIS — R911 Solitary pulmonary nodule: Secondary | ICD-10-CM

## 2023-12-30 ENCOUNTER — Inpatient Hospital Stay (HOSPITAL_COMMUNITY): Admission: RE | Admit: 2023-12-30 | Discharge: 2023-12-30 | Attending: Cardiology | Admitting: Cardiology

## 2023-12-30 ENCOUNTER — Encounter: Payer: Self-pay | Admitting: Thoracic Surgery (Cardiothoracic Vascular Surgery)

## 2023-12-30 ENCOUNTER — Ambulatory Visit
Payer: Self-pay | Attending: Thoracic Surgery (Cardiothoracic Vascular Surgery) | Admitting: Thoracic Surgery (Cardiothoracic Vascular Surgery)

## 2023-12-30 VITALS — BP 92/59 | HR 69 | Resp 20 | Ht 72.0 in | Wt 165.0 lb

## 2023-12-30 DIAGNOSIS — R911 Solitary pulmonary nodule: Secondary | ICD-10-CM

## 2023-12-30 DIAGNOSIS — Z09 Encounter for follow-up examination after completed treatment for conditions other than malignant neoplasm: Secondary | ICD-10-CM

## 2023-12-30 DIAGNOSIS — C3492 Malignant neoplasm of unspecified part of left bronchus or lung: Secondary | ICD-10-CM

## 2023-12-30 NOTE — Progress Notes (Signed)
 [address] [place] Maxwell Young [address]             [phone]    HPI: Maxwell Young returns for scheduled postoperative follow-up visit after recent left upper lobectomy.  Maxwell Young is a 68 year old male with a history of tobacco use, emphysema, CAD, CABG, diastolic dysfunction, aortic atherosclerosis, type II diabetes, hypertension, hyperlipidemia, PAD, BPH, degenerative disc disease, and stage IIb (T3, N0) squamous cell carcinoma of the left upper lobe.  He underwent robotic assisted left upper lobectomy on 10/27/2023.  This was technically difficult due to prior CABG.  Did well postoperatively and went home on day 4.  Had 2 separate squamous cell carcinomas.  Pathologic stage IIb (T3, N0).  Last saw me in office on 11/11/2023 he was doing well at that time.  He continues to feel well.  He saw Dr. Babara at [facility].  She recommended adjuvant chemotherapy, but he declined.  He does get winded with heavier activities.  Past Medical History:  Diagnosis Date   Abdominal aortic ectasia 08/29/2022   a.) US  aorta 08/29/2022: 2.6 cm   Aortic atherosclerosis    Bilateral inguinal hernia    BPH with elevated PSA and lower urinary tract symptoms    CAD (coronary artery disease) 11/13/2006   a.) LHC 11/13/2006: 90% mLAD, 70% D1, 60% D2, 70% pLCx, 100% pRCA, 95% RV marginal - refer to CVTS; b.) s/p 3v CABG 11/21/2006   Cholelithiasis    Colon polyps 01/21/2009   DDD (degenerative disc disease), cervical    a.) s/p C4-C6 discectomy with interbody fusion and anterior plating 06/29/2001   DDD (degenerative disc disease), thoracic    Diastolic dysfunction    a.) TTE 12/11/2022: EF 60-65%, no RWMAs, G1DD, norm RVSF, mild MR, mild-mod TR, AoV sclerosis without stenosis.   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     amLODipine  (NORVASC ) 10 MG tablet Take 1 tablet (10 mg total) by mouth daily. 90 tablet 1   aspirin  EC 81 MG tablet Take 81 mg by mouth daily. Swallow whole.     ibuprofen (ADVIL) 200 MG tablet Take 400 mg by mouth every 8 (eight) hours as needed (pain.).     metFORMIN  (GLUCOPHAGE ) 500 MG tablet Take 2 tablets (1,000 mg total) by mouth 2 (two) times daily with a meal. 360 tablet 1   metoprolol  succinate (TOPROL -XL) 100 MG 24 hr tablet Take 1 tablet (100 mg total) by mouth daily. Take with or immediately following a meal. 90 tablet 1  Multiple Vitamins-Minerals (MULTIVITAMIN ADULT PO) Take 1 tablet by mouth in the morning.     omega-3 acid ethyl esters (LOVAZA ) 1 g capsule TAKE 2 CAPSULES BY MOUTH TWICE A DAY 360 capsule 1   omeprazole  (PRILOSEC) 20 MG capsule TAKE 1 CAPSULE BY MOUTH EVERY DAY 90 capsule 3   rosuvastatin  (CRESTOR ) 40 MG tablet Take 1 tablet (40 mg total) by mouth daily. 90 tablet 1   tamsulosin  (FLOMAX ) 0.4 MG CAPS capsule Take 2 capsules (0.8 mg total) by mouth daily. 180 capsule 1   gabapentin  (NEURONTIN ) 300 MG capsule Take 1 capsule (300 mg total) by mouth 2 (two) times daily. (Patient not taking: Reported on 12/30/2023) 60 capsule 1   traMADol  (ULTRAM ) 50 MG tablet Take 1 tablet (50 mg total) by mouth every 6 (six) hours as  needed (mild pain). (Patient not taking: Reported on 12/30/2023) 30 tablet 0   No current facility-administered medications for this visit.    Physical Exam BP (!) 92/59   Pulse 69   Resp 20   Ht 6' (1.829 m)   Wt 165 lb (74.8 kg)   SpO2 95% Comment: RA  BMI 22.67 kg/m  68 year old man in no acute distress Alert and oriented x 3 with no focal deficits Cardiac regular rate and rhythm Absent breath sounds left base, otherwise clear Incisions well-healed  Diagnostic Tests: I personally reviewed his chest x-ray images.  Volume loss on the left side.  No change from previous film.  Impression: Maxwell Young is a 68 year old male with a history of tobacco use, emphysema, CAD, CABG, diastolic dysfunction, aortic atherosclerosis, type II diabetes, hypertension, hyperlipidemia, PAD, BPH, degenerative disc disease, and stage IIb (T3, N0) squamous cell carcinoma of the left upper lobe.  Stage IIb squamous cell carcinoma-status post left upper lobectomy.  Would benefit from adjuvant therapy, but he opted against that.  He has my opinion and I recommended that he proceed with adjuvant therapy.  Told him that he can get a second opinion if he wanted but any oncologist is likely to recommend treatment in that setting.  He will continue to follow-up with Dr. Babara for his monitoring.  Status post left upper lobectomy-doing well from a pain standpoint.  Does get some shortness of breath with heavy activities but it is not interfering with his routine activities.   Plan: Return in 6 months after CT chest at [facility]  Maxwell JAYSON Millers, MD [facility] [phone]

## 2024-01-01 ENCOUNTER — Ambulatory Visit: Admitting: Family Medicine

## 2024-01-01 ENCOUNTER — Encounter: Payer: Self-pay | Admitting: Family Medicine

## 2024-01-01 VITALS — BP 129/78 | HR 69 | Temp 97.4°F | Ht 72.0 in | Wt 165.2 lb

## 2024-01-01 DIAGNOSIS — R809 Proteinuria, unspecified: Secondary | ICD-10-CM | POA: Diagnosis not present

## 2024-01-01 DIAGNOSIS — E1129 Type 2 diabetes mellitus with other diabetic kidney complication: Secondary | ICD-10-CM

## 2024-01-01 DIAGNOSIS — I10 Essential (primary) hypertension: Secondary | ICD-10-CM

## 2024-01-01 LAB — BAYER DCA HB A1C WAIVED: HB A1C (BAYER DCA - WAIVED): 6 % — ABNORMAL HIGH (ref 4.8–5.6)

## 2024-01-01 MED ORDER — METFORMIN HCL 500 MG PO TABS: 1000.0000 mg | ORAL_TABLET | Freq: Two times a day (BID) | ORAL | 1 refills | Status: AC

## 2024-01-01 MED ORDER — AMLODIPINE BESYLATE 10 MG PO TABS: 10.0000 mg | ORAL_TABLET | Freq: Every day | ORAL | 1 refills | Status: AC

## 2024-01-01 MED ORDER — ROSUVASTATIN CALCIUM 40 MG PO TABS: 40.0000 mg | ORAL_TABLET | Freq: Every day | ORAL | 1 refills | Status: AC

## 2024-01-01 MED ORDER — TAMSULOSIN HCL 0.4 MG PO CAPS: 0.8000 mg | ORAL_CAPSULE | Freq: Every day | ORAL | 1 refills | Status: AC

## 2024-01-01 MED ORDER — OMEGA-3-ACID ETHYL ESTERS 1 G PO CAPS: 2.0000 | ORAL_CAPSULE | Freq: Two times a day (BID) | ORAL | 1 refills | Status: AC

## 2024-01-01 NOTE — Assessment & Plan Note (Signed)
 Doing great with A1c of 6.0. Continue current regimen. Continue to monitor. Call with any concerns.

## 2024-01-01 NOTE — Progress Notes (Signed)
 BP 129/78   Pulse 69   Temp (!) 97.4 F (36.3 C) (Oral)   Ht 6' (1.829 m)   Wt 165 lb 3.2 oz (74.9 kg)   SpO2 96%   BMI 22.41 kg/m    Subjective:    Patient ID: Maxwell Young, male    DOB: 04-24-1955, 68 y.o.   MRN: [id number]  HPI: Maxwell Young is a 68 y.o. male  Chief Complaint  Patient presents with   Diabetes   DIABETES Hypoglycemic episodes:no Polydipsia/polyuria: no Visual disturbance: no Chest pain: no Paresthesias: no Glucose Monitoring: no  Accucheck frequency: Not Checking Taking Insulin ?: no Blood Pressure Monitoring: not checking Retinal Examination: Up to Date Foot Exam: Up to Date Diabetic Education: Completed Pneumovax: Up to Date Influenza: Up to Date Aspirin : yes  Relevant past medical, surgical, family and social history reviewed and updated as indicated. Interim medical history since our last visit reviewed. Allergies and medications reviewed and updated.  Review of Systems  Constitutional: Negative.   Respiratory: Negative.    Cardiovascular: Negative.   Musculoskeletal: Negative.   Neurological: Negative.   Psychiatric/Behavioral: Negative.      Per HPI unless specifically indicated above     Objective:    BP 129/78   Pulse 69   Temp (!) 97.4 F (36.3 C) (Oral)   Ht 6' (1.829 m)   Wt 165 lb 3.2 oz (74.9 kg)   SpO2 96%   BMI 22.41 kg/m   Wt Readings from Last 3 Encounters:  01/01/24 165 lb 3.2 oz (74.9 kg)  12/30/23 165 lb (74.8 kg)  11/19/23 161 lb (73 kg)    Physical Exam Vitals and nursing note reviewed.  Constitutional:      General: He is not in acute distress.    Appearance: Normal appearance. He is normal weight. He is not ill-appearing, toxic-appearing or diaphoretic.  HENT:     Head: Normocephalic and atraumatic.     Right Ear: External ear normal.     Left Ear: External ear normal.     Nose: Nose normal.     Mouth/Throat:     Mouth: Mucous membranes are moist.     Pharynx: Oropharynx is clear.   Eyes:     General: No scleral icterus.       Right eye: No discharge.        Left eye: No discharge.     Extraocular Movements: Extraocular movements intact.     Conjunctiva/sclera: Conjunctivae normal.     Pupils: Pupils are equal, round, and reactive to light.  Cardiovascular:     Rate and Rhythm: Normal rate and regular rhythm.     Pulses: Normal pulses.     Heart sounds: Normal heart sounds. No murmur heard.    No friction rub. No gallop.  Pulmonary:     Effort: Pulmonary effort is normal. No respiratory distress.     Breath sounds: Normal breath sounds. No stridor. No wheezing, rhonchi or rales.  Chest:     Chest wall: No tenderness.  Musculoskeletal:        General: Normal range of motion.     Cervical back: Normal range of motion and neck supple.  Skin:    General: Skin is warm and dry.     Capillary Refill: Capillary refill takes less than 2 seconds.     Coloration: Skin is not jaundiced or pale.     Findings: No bruising, erythema, lesion or rash.  Neurological:     General:  No focal deficit present.     Mental Status: He is alert and oriented to person, place, and time. Mental status is at baseline.  Psychiatric:        Mood and Affect: Mood normal.        Behavior: Behavior normal.        Thought Content: Thought content normal.        Judgment: Judgment normal.     Results for orders placed or performed in visit on 11/19/23  CBC with Differential/Platelet   Collection Time: 11/19/23  2:58 PM  Result Value Ref Range   WBC 8.0 4.0 - 10.5 K/uL   RBC 4.34 4.22 - 5.81 MIL/uL   Hemoglobin 13.3 13.0 - 17.0 g/dL   HCT 60.5 60.9 - 47.9 %   MCV 90.8 80.0 - 100.0 fL   MCH 30.6 26.0 - 34.0 pg   MCHC 33.8 30.0 - 36.0 g/dL   RDW 86.5 88.4 - 84.4 %   Platelets 252 150 - 400 K/uL   nRBC 0.0 0.0 - 0.2 %   Neutrophils Relative % 70 %   Neutro Abs 5.6 1.7 - 7.7 K/uL   Lymphocytes Relative 18 %   Lymphs Abs 1.4 0.7 - 4.0 K/uL   Monocytes Relative 10 %   Monocytes  Absolute 0.8 0.1 - 1.0 K/uL   Eosinophils Relative 1 %   Eosinophils Absolute 0.1 0.0 - 0.5 K/uL   Basophils Relative 1 %   Basophils Absolute 0.1 0.0 - 0.1 K/uL   Immature Granulocytes 0 %   Abs Immature Granulocytes 0.03 0.00 - 0.07 K/uL  Comprehensive metabolic panel with GFR   Collection Time: 11/19/23  2:58 PM  Result Value Ref Range   Sodium 137 135 - 145 mmol/L   Potassium 4.8 3.5 - 5.1 mmol/L   Chloride 104 98 - 111 mmol/L   CO2 24 22 - 32 mmol/L   Glucose, Bld 151 (H) 70 - 99 mg/dL   BUN 17 8 - 23 mg/dL   Creatinine, Ser 9.14 0.61 - 1.24 mg/dL   Calcium  9.0 8.9 - 10.3 mg/dL   Total Protein 7.1 6.5 - 8.1 g/dL   Albumin  4.1 3.5 - 5.0 g/dL   AST 28 15 - 41 U/L   ALT 22 0 - 44 U/L   Alkaline Phosphatase 62 38 - 126 U/L   Total Bilirubin 0.8 0.0 - 1.2 mg/dL   GFR, Estimated >39 >39 mL/min   Anion gap 9 5 - 15      Assessment & Plan:   Problem List Items Addressed This Visit       Cardiovascular and Mediastinum   Essential hypertension   Relevant Medications   amLODipine  (NORVASC ) 10 MG tablet   metoprolol  succinate (TOPROL -XL) 100 MG 24 hr tablet   omega-3 acid ethyl esters (LOVAZA ) 1 g capsule   rosuvastatin  (CRESTOR ) 40 MG tablet     Endocrine   Type 2 diabetes mellitus with microalbuminuria (HCC) - Primary   Doing great with A1c of 6.0. Continue current regimen. Continue to monitor. Call with any concerns.       Relevant Medications   metFORMIN  (GLUCOPHAGE ) 500 MG tablet   rosuvastatin  (CRESTOR ) 40 MG tablet   Other Relevant Orders   Bayer DCA Hb A1c Waived     Follow up plan: Return in about 6 months (around 07/01/2024).

## 2024-06-01 ENCOUNTER — Ambulatory Visit

## 2024-06-07 ENCOUNTER — Inpatient Hospital Stay: Admitting: Oncology

## 2024-07-01 ENCOUNTER — Ambulatory Visit: Admitting: Family Medicine
